# Patient Record
Sex: Male | Born: 1950
Health system: Southern US, Community
[De-identification: ages and names within clinical notes are randomized; demographics above are authoritative.]

## PROBLEM LIST (undated history)

## (undated) DIAGNOSIS — R9430 Abnormal result of cardiovascular function study, unspecified: Secondary | ICD-10-CM

## (undated) DIAGNOSIS — M1990 Unspecified osteoarthritis, unspecified site: Secondary | ICD-10-CM

## (undated) DIAGNOSIS — C4491 Basal cell carcinoma of skin, unspecified: Secondary | ICD-10-CM

## (undated) DIAGNOSIS — I219 Acute myocardial infarction, unspecified: Secondary | ICD-10-CM

## (undated) DIAGNOSIS — K219 Gastro-esophageal reflux disease without esophagitis: Secondary | ICD-10-CM

## (undated) DIAGNOSIS — I2510 Atherosclerotic heart disease of native coronary artery without angina pectoris: Secondary | ICD-10-CM

## (undated) DIAGNOSIS — Z9049 Acquired absence of other specified parts of digestive tract: Secondary | ICD-10-CM

## (undated) DIAGNOSIS — Z8709 Personal history of other diseases of the respiratory system: Secondary | ICD-10-CM

## (undated) DIAGNOSIS — E785 Hyperlipidemia, unspecified: Secondary | ICD-10-CM

## (undated) DIAGNOSIS — R0902 Hypoxemia: Secondary | ICD-10-CM

## (undated) DIAGNOSIS — J189 Pneumonia, unspecified organism: Secondary | ICD-10-CM

## (undated) DIAGNOSIS — N189 Chronic kidney disease, unspecified: Secondary | ICD-10-CM

## (undated) DIAGNOSIS — I7090 Unspecified atherosclerosis: Secondary | ICD-10-CM

## (undated) DIAGNOSIS — IMO0002 Reserved for concepts with insufficient information to code with codable children: Secondary | ICD-10-CM

## (undated) HISTORY — PX: CARDIAC CATHETERIZATION: SHX172

## (undated) HISTORY — PX: CHEST TUBE INSERTION: SHX231

## (undated) HISTORY — DX: Hyperlipidemia, unspecified: E78.5

## (undated) HISTORY — DX: Hypoxemia: R09.02

## (undated) HISTORY — PX: JOINT REPLACEMENT: SHX530

## (undated) HISTORY — DX: Atherosclerotic heart disease of native coronary artery without angina pectoris: I25.10

## (undated) HISTORY — PX: LEG SURGERY: SHX1003

## (undated) HISTORY — DX: Abnormal result of cardiovascular function study, unspecified: R94.30

## (undated) HISTORY — DX: Gastro-esophageal reflux disease without esophagitis: K21.9

## (undated) HISTORY — DX: Reserved for concepts with insufficient information to code with codable children: IMO0002

## (undated) HISTORY — DX: Acute myocardial infarction, unspecified: I21.9

## (undated) HISTORY — DX: Basal cell carcinoma of skin, unspecified: C44.91

## (undated) HISTORY — DX: Acquired absence of other specified parts of digestive tract: Z90.49

## (undated) HISTORY — DX: Chronic kidney disease, unspecified: N18.9

---

## 1976-01-15 HISTORY — PX: PLEURAL SCARIFICATION: SHX748

## 1979-01-15 HISTORY — PX: COLECTOMY: SHX59

## 1993-01-14 DIAGNOSIS — I219 Acute myocardial infarction, unspecified: Secondary | ICD-10-CM

## 1993-01-14 HISTORY — DX: Acute myocardial infarction, unspecified: I21.9

## 1999-06-11 ENCOUNTER — Encounter: Payer: Self-pay | Admitting: Cardiology

## 1999-06-11 ENCOUNTER — Inpatient Hospital Stay (HOSPITAL_COMMUNITY): Admission: AD | Admit: 1999-06-11 | Discharge: 1999-06-14 | Payer: Self-pay | Admitting: Cardiology

## 1999-06-13 ENCOUNTER — Encounter: Payer: Self-pay | Admitting: Cardiology

## 2002-08-02 ENCOUNTER — Emergency Department (HOSPITAL_COMMUNITY): Admission: EM | Admit: 2002-08-02 | Discharge: 2002-08-02 | Payer: Self-pay | Admitting: Emergency Medicine

## 2003-11-27 ENCOUNTER — Emergency Department (HOSPITAL_COMMUNITY): Admission: EM | Admit: 2003-11-27 | Discharge: 2003-11-28 | Payer: Self-pay | Admitting: Emergency Medicine

## 2004-03-13 ENCOUNTER — Ambulatory Visit: Payer: Self-pay | Admitting: Family Medicine

## 2004-06-12 ENCOUNTER — Encounter: Admission: RE | Admit: 2004-06-12 | Discharge: 2004-06-12 | Payer: Self-pay | Admitting: Occupational Medicine

## 2004-09-21 ENCOUNTER — Ambulatory Visit: Payer: Self-pay | Admitting: Family Medicine

## 2005-07-19 ENCOUNTER — Ambulatory Visit: Payer: Self-pay | Admitting: Family Medicine

## 2005-07-23 ENCOUNTER — Ambulatory Visit: Payer: Self-pay | Admitting: Family Medicine

## 2005-12-20 ENCOUNTER — Ambulatory Visit: Payer: Self-pay | Admitting: Family Medicine

## 2006-06-26 ENCOUNTER — Ambulatory Visit: Payer: Self-pay | Admitting: Family Medicine

## 2010-01-14 DIAGNOSIS — J189 Pneumonia, unspecified organism: Secondary | ICD-10-CM

## 2010-01-14 HISTORY — DX: Pneumonia, unspecified organism: J18.9

## 2010-06-01 NOTE — Procedures (Signed)
[place]. [facility]  Patient:    Tony Patrick, Tony Patrick                       MRN: [id number] Proc. Date: 06/13/99 Adm. Date:  [id number] Disc. Date: [id number] Attending:  Mirian Mo CC:         Jesse Sans. Wall, M.D.                           Procedure Report  PROCEDURE:  Endoscopy.  ENDOSCOPIST:  Barbette Hair. Arlyce Dice, M.D.  INDICATIONS:  The patient was admitted with chest pain.  He has been taking ibuprofen as an outpatient.  Cardiac cath was negative.  He does have a history of coronary artery disease.  This test is performed for further evaluation.  INFORMED CONSENT:  The patient provided consent after risks, benefits, and alternatives were explained.  MEDICATIONS:  Robinul 0.2, Versed 8, fentanyl 50 mcg IV, and Cetacaine spray.  DESCRIPTION OF PROCEDURE:  The patient was placed in the left lateral decubitus position, administered continuous low flow oxygen, and was placed on pulse oximetry.  The Olympus video gastroscope was inserted under direct vision to the oropharynx and esophagus.  FINDINGS: 1. At the GE junction, there was moderately severe inflamed mucosa    characterized by hemorrhagic mucosa and slight friability.  These changes    extended 4-5 cm proximally.  It also extended into the very proximal    gastric cardia. 2. There was moderate erythema in the duodenal bulb. 3. Normal stomach and second and third portions of the duodenum.  IMPRESSION: 1. Erosive esophagitis. 2. Duodenitis.  RECOMMENDATIONS: 1. Continue Protonix 40 mg a day. 2. Hold NSAIDs. DD:  06/13/99 TD:  06/18/99 Job: [id number]

## 2010-06-01 NOTE — Discharge Summary (Signed)
[place]. [facility]  Patient:    Tony Patrick, Tony Patrick                       MRN: [id number] Adm. Date:  [id number] Disc. Date: 06/14/99 Attending:  Mirian Mo Dictator:   Tereso Newcomer, P.A.-C. CC:         Barbette Hair. Arlyce Dice, M.D. [facility]             Madolyn Frieze. Jens Som, M.D. [facility]             Roxanne Mins, P.A.-C. Jonita [place], [place]             Colon Flattery, M.D. - Phone [phone]                           Discharge Summary  DATE OF BIRTH:  [birth year]  DISCHARGE DIAGNOSES: 1. Severe esophagitis by endoscopy. 2. History of coronary artery disease.  The reason for admission was chest pain.    A cardiac catheterization this admission with a 30% LAD stenosis beyond the    origin of the large diagonal branch, circumflex normal, RCA normal, left main    normal, with anterolateral and apical akinesis, ejection fraction calculated at    25%, but visually more in the range of 35%. 3. Hypercholesterolemia. 4. Status post left colectomy, secondary to benign tumor. 5. Status post anterior wall myocardial infarction in 1995.  A cardiac    catheterization done at [facility] with a totally-occluded left    anterior descending coronary artery and an ejection fraction of 33%.  HISTORY OF PRESENT ILLNESS:  This 60 year old white male with CAD, status post large anterior wall myocardial infarction in 1995, and hypercholesterolemia, was transferred from [facility], secondary to chest pain.  He was awakened on the morning before admission with shortness of breath, right-sided chest pain and sweating.  His chest pain lasted all day, and was associated with emesis, shortness of breath, and diaphoresis.  His right-sided chest pain was exacerbated with movement, and radiated to his right arm.  When he presented to [facility], two nitroglycerin relieved the pain.  Upon initial evaluation the patient was asymptomatic.  PHYSICAL EXAMINATION:  GENERAL:  A  male in no acute distress.  NECK:  Without jugular venous distention.  LUNGS:  Clear to auscultation.  HEART:  A regular rate and rhythm.  Normal S1, S2.  No murmurs, gallops, or rubs.  EXTREMITIES:  Without edema, with 1+ pulses.  LABORATORY DATA:  Hemoglobin 15.4, platelet count 236,000, WBC 9800.  Sodium 139, potassium 3.6, chloride 113, CO2 of 22, BUN 16, creatinine 1.1, glucose 93. Total protein 6.8, albumin 3.6, alkaline phosphatase 66, SGOT 23, SGPT 23, total bilirubin 0.4.  INR 1, PTT 25.4, PT 11.7, CPK 106, MB 1.5, troponin I 0.04.  Electrocardiogram showed a normal sinus rhythm, unchanged from previous tracings.  HOSPITAL COURSE:  The patient was received from [facility] and placed on the TCU.  His heparin drip was continued.  His home medication regimen was resumed. He was placed on a proton pump inhibitor for a history of GERD.  Also enzymes were  checked at [facility].  The first set done showed a total CPK of 94, MB of 1.0, troponin I less than 0.03.  The second set showed a CPK of 78, MB 0.6, troponin I less than 0.03.  On the morning of May __, 2001,  it was noted that the patients electrocardiogram had increased T-wave inversions anteriorly.  Given this change, it was felt that the patient should undergo a cardiac catheterization to redefine his coronary anatomy.  The patient remained stable without any further chest pain.  The patient went for a cardiac catheterization on Jun 12, 1999.  The results are noted above. He had no immediate complications.  Given his long history of GERD and unobstructive disease by a coronary angiography, it was felt that a GI consultation was warranted.  Gastroenterology saw the patient on Jun 12, 1999.  They continued the patient on a proton pump inhibitor and decided to check an abdominal ultrasound, as well as set him up for an esophagogastroduodenoscopy.  The patient continued to do well and  had no further chest pain.  His ultrasound was reported as negative by gastroenterology on Jun 13, 1999.  His endoscopy showed moderately-severe esophagitis and they felt this could be his likely source of chest pain.  DISPOSITION:  On the morning of Jun 14, 1999, it was felt that the patient was stable enough for discharge to home.  DISCHARGE MEDICATIONS: 1. Prevacid 30 mg p.o. q.a.m. before breakfast. 2. Zocor 20 mg p.o. q.h.s. 3. Enteric-coated aspirin 325 mg p.o. q.d. 4. Atenolol 50 mg 1/2 p.o. q.d.  This was decreased secondary to bradycardia. 5. Vasotec 20 mg p.o. b.i.d. 6. Nitroglycerin 0.4 mg sublingual p.r.n. chest pain.  ACTIVITIES:  The patient should refrain from any driving or heavy lifting for the next three days.  He was given a note to return to work on Monday, June 18, 1999.  DIET:  Low-fat, low-cholesterol, low-sodium diet.  WOUND CARE:  The patient should watch his groin for any increased swelling, bleeding, or bruising and to call the office with concerns.  FOLLOWUP:  The patient will follow up with Dr. Madolyn Frieze. Cren[place] on Thursday, July 26, 1999, at 10 a.m.  Gastroenterology recommended that the patient should follow up with Dr. Barbette Hair. Arlyce Dice in one year. DD:  06/14/99 TD:  06/14/99 Job: [id number]

## 2010-06-01 NOTE — Cardiovascular Report (Signed)
[place]. [facility]  Patient:    Tony Patrick, Tony Patrick                       MRN: [id number] Proc. Date: 06/12/99 Adm. Date:  [id number] Attending:  Mirian Mo CC:         Arturo Morton. Riley Kill, M.D. [facility]             [facility]             Colon Flattery, D.O.             Roxanne Mins, P.A.C., [facility]                        Cardiac Catheterization  HISTORY:   Tony Patrick is a very pleasant 60 year old male who has had previous anterior wall infarction.  This was treated at [facility].  The patient now presents with recurrent chest pain.  INDICATIONS:  Recurrent chest pain with known coronary artery disease.  PROCEDURES: 1. Left heart catheterization. 2. Selective coronary arteriography. 3. Selective left ventriculography.  COMPLICATIONS:  None.  DESCRIPTION OF PROCEDURE:  The procedure was performed from the right femoral artery using 6 French catheters.  He tolerated the procedure without complication.  He was taken to the holding area in satisfactory clinical condition.  The patient has a history of CONTRAST allergy and was pretreated with steroids, Benadryl and histamine II antagonist.  HEMODYNAMICS:  The central aortic pressure was 117/79. LV pressure 117/15. No gradient on pullback across the aortic valve.  ANGIOGRAPHIC DATA:  The left main coronary artery is free of significant disease.  The left anterior descending artery courses to the apex.  Beyond the origin of the large diagonal branch there is narrowing that measures about 30% in luminal diameter.  This does not appear to be high-grade.  The LAD courses to the apex and wraps the apical tip.  There was a large diagonal branch with minimal ostial irregularity but no high-grade lesions.  There are smaller distal diagonal branches.  The circumflex coronary provides a small first marginal branch that is insignificant.  The circumflex consists predominately of a very  large marginal branch that courses out over the anterolateral wall and bifurcates distally and is free of critical disease.  The AV circumflex is small and provides an atrial circumflex branch.  The right coronary artery is a large dominant vessel.  The right coronary artery provides a posterior descending branch which bifurcates and five posterolateral branches.  The distal right coronary demonstrates no high-grade lesions.  LEFT VENTRICULOGRAPHY:  Ventriculography in the RAO projection reveals anterolateral and apical akinesis.  The inferobasal and superior basal segments appear to move.  Ejection fraction was calculated at 25% but visually was more in the range of 35%.  No significant mitral regurgitation was noted.  CONCLUSIONS: 1. Moderately severe reduction in global left ventricular function. 2. No evidence of re-stenosis in the left anterior descending artery. 3. Continued patency of the circumflex and right coronary arteries.  DISPOSITION:  The patient is already on an ACE inhibitor, beta blockers and cholesterol lowering agent.  He is also on aspirin.  Protonix has been added to his regimen because of a history of GERD.  A GI consult will be obtained. I have called Dr. Dewaine Conger and also discussed the case with Arnette Felts. DD:  06/12/99 TD:  06/13/99 Job: [id number]

## 2010-07-02 DIAGNOSIS — I059 Rheumatic mitral valve disease, unspecified: Secondary | ICD-10-CM

## 2010-07-12 ENCOUNTER — Encounter: Payer: Self-pay | Admitting: *Deleted

## 2010-07-23 ENCOUNTER — Encounter: Payer: Self-pay | Admitting: Cardiology

## 2010-07-23 DIAGNOSIS — E785 Hyperlipidemia, unspecified: Secondary | ICD-10-CM | POA: Insufficient documentation

## 2010-07-23 DIAGNOSIS — K219 Gastro-esophageal reflux disease without esophagitis: Secondary | ICD-10-CM | POA: Insufficient documentation

## 2010-07-23 DIAGNOSIS — R0902 Hypoxemia: Secondary | ICD-10-CM | POA: Insufficient documentation

## 2010-07-23 DIAGNOSIS — I2510 Atherosclerotic heart disease of native coronary artery without angina pectoris: Secondary | ICD-10-CM | POA: Insufficient documentation

## 2010-07-23 DIAGNOSIS — R9430 Abnormal result of cardiovascular function study, unspecified: Secondary | ICD-10-CM | POA: Insufficient documentation

## 2010-07-24 ENCOUNTER — Ambulatory Visit (INDEPENDENT_AMBULATORY_CARE_PROVIDER_SITE_OTHER): Payer: BC Managed Care – PPO | Admitting: Cardiology

## 2010-07-24 ENCOUNTER — Encounter: Payer: Self-pay | Admitting: Cardiology

## 2010-07-24 DIAGNOSIS — E785 Hyperlipidemia, unspecified: Secondary | ICD-10-CM

## 2010-07-24 DIAGNOSIS — I2510 Atherosclerotic heart disease of native coronary artery without angina pectoris: Secondary | ICD-10-CM

## 2010-07-24 DIAGNOSIS — R9430 Abnormal result of cardiovascular function study, unspecified: Secondary | ICD-10-CM

## 2010-07-24 DIAGNOSIS — R0989 Other specified symptoms and signs involving the circulatory and respiratory systems: Secondary | ICD-10-CM

## 2010-07-24 MED ORDER — CARVEDILOL 12.5 MG PO TABS: 12.5000 mg | ORAL_TABLET | Freq: Two times a day (BID) | ORAL | Status: DC

## 2010-07-24 NOTE — Assessment & Plan Note (Signed)
Patient had an MI in 1995.  There is question that he may have had repeat catheterization in 2001. I will have to reconfirm this.  Regardless, he has had no significant evaluation since 003.  He is not having any significant symptoms.

## 2010-07-24 NOTE — Progress Notes (Signed)
HPI The patient is seen today to establish cardiology care.  He had been seen in this office last in 2003.  Unfortunately the chart from that time is not available.  I have reviewed other medical records back to 2001.  The patient had an MRI and was first assessed at [facility] in 1995.  My records suggest that he had a followup catheterization at [facility] in 2001.  The patient and his wife insists that he did not have this done then.  I will have to try to re\re reviewed the records.  The patient has not had any significant cardiac evaluation since 2003.  He does have known left ventricular dysfunction.  He was recently hospitalized with a pneumonia.  2-D echo at that time was read as showing an ejection fraction of 30%.  He is now here to reestablish cardiology care.  He is very active.  He's not having chest pain or shortness of breath.  He's had no syncope or presyncope. Allergies  Allergen Reactions  . Ivp Dye (Iodinated Diagnostic Agents)     Swelling and arrest  . Morphine And Related Nausea And Vomiting    Current Outpatient Prescriptions  Medication Sig Dispense Refill  . aspirin 81 MG tablet Take 81 mg by mouth daily.        . enalapril (VASOTEC) 20 MG tablet Take 20 mg by mouth 2 (two) times daily.        Marland Kitchen loratadine (CLARITIN) 10 MG tablet Take 10 mg by mouth daily.        . meloxicam (MOBIC) 7.5 MG tablet Take 7.5 mg by mouth 2 (two) times daily.        Marland Kitchen omeprazole (PRILOSEC) 20 MG capsule Take 1 capsule by mouth Daily.      . simvastatin (ZOCOR) 40 MG tablet Take 20 mg by mouth at bedtime.       Marland Kitchen DISCONTD: atenolol (TENORMIN) 50 MG tablet Take 50 mg by mouth daily.        . carvedilol (COREG) 12.5 MG tablet Take 1 tablet (12.5 mg total) by mouth 2 (two) times daily.  60 tablet  6    History   Social History  . Marital Status: Married    Spouse Name: N/A    Number of Children: N/A  . Years of Education: N/A   Occupational History  . Not on file.   Social  History Main Topics  . Smoking status: Former Smoker -- 2.0 packs/day for 32 years    Types: Cigarettes    Quit date: 04/14/1992  . Smokeless tobacco: Never Used  . Alcohol Use: No  . Drug Use: No  . Sexually Active: Not on file   Other Topics Concern  . Not on file   Social History Narrative   Lives with wife    Family History  Problem Relation Age of Onset  . Diabetes Mother   . Hypertension Mother     Past Medical History  Diagnosis Date  . CAD (coronary artery disease)     Anterior MI, [facility], 1995, no PCI, total LAD  / catheterization 2001, 30% LAD beyond the origin of a large diagonal, circumflex normal, RCA normal, anterolateral and apical  akinesis, ejection fraction 25-35% range  . Kidney stones 1990's  . GERD (gastroesophageal reflux disease)   . S/P colectomy     Benign tumor  . Dyslipidemia   . Ejection fraction < 50%     EF 25-35%, catheterization 2001 / EF 20-25%,  global hypokinesis, echo in June, 2012  . Hypoxia     Pneumonia, hospitalization, June, 2012    Past Surgical History  Procedure Date  . Colectomy 1981    benign mass  . Leg surgery     benign knot  . Pleural scarification 1978    ROS  Patient denies fever, chills, headache, sweats, rash, change in vision, change in hearing, chest pain, cough, nausea vomiting, urinary symptoms.  All other systems are reviewed and are negative.  PHYSICAL EXAM Patient is here with his wife today.  He is oriented to person time and place.  Affect is normal.  He is overweight.  Head is atraumatic.  Lungs are clear.  Respiratory effort is nonlabored.  There no carotid bruits.  There is no jugular venous distention.  Neck exam reveals S1-S2.  No clicks or significant murmurs.  The abdomen is soft.  There is no peripheral edema.  There are no musculoskeletal deformities.  There are no skin rashes. Filed Vitals:   07/24/10 1109  BP: 131/91  Pulse: 61  Height: 5\' 9"  (1.753 m)  Weight: 233 lb (105.688 kg)    EKG  EKG is not done today. ASSESSMENT & PLAN

## 2010-07-24 NOTE — Assessment & Plan Note (Signed)
Most recently the patient's echo suggested an ejection fraction of 20-25%.  I will rereview this echo.  I am changing his atenolol to carvedilol and all seem back for followup to titrate his medicines.  He is on an ACE inhibitor.  We will consider spironolactone a later date.  At some point we will have to discuss whether ICD is indicated.  From today's visit it was not appropriate to discuss his Foley today.

## 2010-07-24 NOTE — Assessment & Plan Note (Signed)
Lipids are being treated. No change in therapy. 

## 2010-07-24 NOTE — Patient Instructions (Addendum)
Follow up as scheduled. Stop Atenolol. Start Coreg (carvedilol) 12.5 mg two times a day.

## 2010-10-02 ENCOUNTER — Ambulatory Visit (INDEPENDENT_AMBULATORY_CARE_PROVIDER_SITE_OTHER): Payer: BC Managed Care – PPO | Admitting: Cardiology

## 2010-10-02 ENCOUNTER — Encounter: Payer: Self-pay | Admitting: Cardiology

## 2010-10-02 DIAGNOSIS — I2510 Atherosclerotic heart disease of native coronary artery without angina pectoris: Secondary | ICD-10-CM

## 2010-10-02 DIAGNOSIS — K219 Gastro-esophageal reflux disease without esophagitis: Secondary | ICD-10-CM

## 2010-10-02 DIAGNOSIS — R9430 Abnormal result of cardiovascular function study, unspecified: Secondary | ICD-10-CM

## 2010-10-02 DIAGNOSIS — R0989 Other specified symptoms and signs involving the circulatory and respiratory systems: Secondary | ICD-10-CM

## 2010-10-02 MED ORDER — CARVEDILOL 25 MG PO TABS: 25.0000 mg | ORAL_TABLET | Freq: Two times a day (BID) | ORAL | Status: DC

## 2010-10-02 NOTE — Assessment & Plan Note (Signed)
The patient is stable from his cardiomyopathy.  At his last visit I had switched him from a different beta blocker to carvedilol.  Heart rate is 71.  Next we increase the carvedilol.  I will see him back in a few weeks and make decision about whether I can increase the dose of his ACE inhibitor.  After that consideration be given to using spironolactone.  After that to be a followup echo and we will begin to think about whether an ICD should be considered.

## 2010-10-02 NOTE — Assessment & Plan Note (Signed)
Coronary disease is stable.  No further workup. 

## 2010-10-02 NOTE — Assessment & Plan Note (Signed)
He's not been having any recent GERD symptoms.

## 2010-10-02 NOTE — Progress Notes (Signed)
HPI Patient is seen today for followup of coronary disease and cardiomyopathy.  I saw him last July 24, 2010.  I saw him at that visit it was his first visit after hospitalization.  He was noted to have new left ventricular dysfunction at that time.  There was no myocardial infarction.  At his last visit I began titrating his medications.  He is now here for further titration.  He feels well and is not having chest pain.  He's not had any significant shortness of breath.  There's been no syncope or presyncope. Allergies  Allergen Reactions  . Ivp Dye (Iodinated Diagnostic Agents)     Swelling and arrest  . Morphine And Related Nausea And Vomiting    Current Outpatient Prescriptions  Medication Sig Dispense Refill  . aspirin 81 MG tablet Take 81 mg by mouth daily.        . carvedilol (COREG) 12.5 MG tablet Take 1 tablet (12.5 mg total) by mouth 2 (two) times daily.  60 tablet  6  . enalapril (VASOTEC) 20 MG tablet Take 20 mg by mouth 2 (two) times daily.        Marland Kitchen loratadine (CLARITIN) 10 MG tablet Take 10 mg by mouth daily.        . meloxicam (MOBIC) 7.5 MG tablet Take 7.5 mg by mouth 2 (two) times daily.        Marland Kitchen omeprazole (PRILOSEC) 20 MG capsule Take 1 capsule by mouth Daily.      . simvastatin (ZOCOR) 40 MG tablet Take 20 mg by mouth at bedtime.         History   Social History  . Marital Status: Married    Spouse Name: N/A    Number of Children: N/A  . Years of Education: N/A   Occupational History  . Not on file.   Social History Main Topics  . Smoking status: Former Smoker -- 2.0 packs/day for 32 years    Types: Cigarettes    Quit date: 04/14/1992  . Smokeless tobacco: Never Used  . Alcohol Use: No  . Drug Use: No  . Sexually Active: Not on file   Other Topics Concern  . Not on file   Social History Narrative   Lives with wife    Family History  Problem Relation Age of Onset  . Diabetes Mother   . Hypertension Mother     Past Medical History  Diagnosis  Date  . CAD (coronary artery disease)     Anterior MI, [facility], 1995, no PCI, total LAD  / catheterization 2001, 30% LAD beyond the origin of a large diagonal, circumflex normal, RCA normal, anterolateral and apical  akinesis, ejection fraction 25-35% range  . Kidney stones 1990's  . GERD (gastroesophageal reflux disease)   . S/P colectomy     Benign tumor  . Dyslipidemia   . Ejection fraction < 50%     EF 25-35%, catheterization 2001 / EF 20-25%, global hypokinesis, echo in June, 2012  . Hypoxia     Pneumonia, hospitalization, June, 2012    Past Surgical History  Procedure Date  . Colectomy 1981    benign mass  . Leg surgery     benign knot  . Pleural scarification 1978    ROS  Patient denies fever, chills, headache, sweats, rash, change in vision, change in hearing, chest pain, cough, nausea vomiting, urinary symptoms.  All other systems are reviewed and are negative. PHYSICAL EXAM Patient is stable today.  He is oriented  to person time and place.  Affect is normal.  Head is atraumatic.  There is no jugular venous distention.  Lungs are clear.  Respiratory effort is nonlabored.  Cardiac exam reveals S1-S2.  No clicks or significant murmurs.  The abdomen is soft.  There is no peripheral edema. There were no vitals filed for this visit.  EKG EKG is not done today.  ASSESSMENT & PLAN

## 2010-10-02 NOTE — Patient Instructions (Signed)
Follow up as scheduled. Increase Coreg (Carvedilol) to 25 mg two times a day. You may take 2 of your 12.5 mg tablets two times a day until gone and then get new prescription filled for 25 mg tablets.

## 2010-10-29 ENCOUNTER — Encounter: Payer: Self-pay | Admitting: Cardiology

## 2010-10-29 ENCOUNTER — Ambulatory Visit (INDEPENDENT_AMBULATORY_CARE_PROVIDER_SITE_OTHER): Payer: BC Managed Care – PPO | Admitting: Cardiology

## 2010-10-29 DIAGNOSIS — I2510 Atherosclerotic heart disease of native coronary artery without angina pectoris: Secondary | ICD-10-CM

## 2010-10-29 DIAGNOSIS — R0989 Other specified symptoms and signs involving the circulatory and respiratory systems: Secondary | ICD-10-CM

## 2010-10-29 DIAGNOSIS — R9430 Abnormal result of cardiovascular function study, unspecified: Secondary | ICD-10-CM

## 2010-10-29 NOTE — Patient Instructions (Signed)
Follow up as scheduled. Your physician recommends that you continue on your current medications as directed. Please refer to the Current Medication list given to you today. Your physician has requested that you have an echocardiogram. Echocardiography is a painless test that uses sound waves to create images of your heart. It provides your doctor with information about the size and shape of your heart and how well your heart's chambers and valves are working. This procedure takes approximately one hour. There are no restrictions for this procedure.

## 2010-10-29 NOTE — Assessment & Plan Note (Signed)
Patient has significant cardiomyopathy.  His meds have now been adjusted to the place where we can receive a followup 2-D echo.  Today I have had a long and careful discussion with the patient and his wife.  It was the first time that I mentioned to him the concept of placing an ICD for LV dysfunction.  I explained to him that we will now schedule a followup 2-D echo.  I will then see him back for followup for further discussion.  With our first discussion today about possible ICD he listened but was not willing to give any type of definite answer.  When I see him back we'll have further discussions.  I am not titrating his meds any further today.  I will want to add spironolactone.  I will again review we should increase the dose of his ACE inhibitor or changing to another Ace.

## 2010-10-29 NOTE — Progress Notes (Signed)
HPI Patient is seen today to followup coronary disease and cardiomyopathy.  He had been hospitalized with pneumonia in June, 2012.  Two-dimensional echo at that time revealed ejection fraction of 30%.  He therefore reestablish with my care.  We know that he has coronary disease.  His last catheterization was 2001.  He had anterolateral and apical akinesis.  Is not had an exercise test since that time.  Most recently I have been titrating his meds for his cardiomyopathy.  He is tolerating the higher dose of carvedilol.  He is not having any chest pain or shortness of breath.   Allergies  Allergen Reactions  . Ivp Dye (Iodinated Diagnostic Agents)     Swelling and arrest  . Morphine And Related Nausea And Vomiting    Current Outpatient Prescriptions  Medication Sig Dispense Refill  . aspirin 81 MG tablet Take 81 mg by mouth daily.        . carvedilol (COREG) 25 MG tablet Take 1 tablet (25 mg total) by mouth 2 (two) times daily.  60 tablet  6  . enalapril (VASOTEC) 20 MG tablet Take 20 mg by mouth 2 (two) times daily.        Marland Kitchen loratadine (CLARITIN) 10 MG tablet Take 10 mg by mouth daily.        . meloxicam (MOBIC) 7.5 MG tablet Take 7.5 mg by mouth daily.       Marland Kitchen omeprazole (PRILOSEC) 20 MG capsule Take 1 capsule by mouth Daily.      . simvastatin (ZOCOR) 40 MG tablet Take 40 mg by mouth at bedtime.         History   Social History  . Marital Status: Married    Spouse Name: N/A    Number of Children: N/A  . Years of Education: N/A   Occupational History  . Not on file.   Social History Main Topics  . Smoking status: Former Smoker -- 2.0 packs/day for 32 years    Types: Cigarettes    Quit date: 04/14/1992  . Smokeless tobacco: Never Used  . Alcohol Use: No  . Drug Use: No  . Sexually Active: Not on file   Other Topics Concern  . Not on file   Social History Narrative   Lives with wife    Family History  Problem Relation Age of Onset  . Diabetes Mother   .  Hypertension Mother     Past Medical History  Diagnosis Date  . CAD (coronary artery disease)     Anterior MI, [facility], 1995, no PCI, total LAD  / catheterization 2001, 30% LAD beyond the origin of a large diagonal, circumflex normal, RCA normal, anterolateral and apical  akinesis, ejection fraction 25-35% range  . Kidney stones 1990's  . GERD (gastroesophageal reflux disease)   . S/P colectomy     Benign tumor  . Dyslipidemia   . Ejection fraction < 50%     EF 25-35%, catheterization 2001 / EF 20-25%, global hypokinesis, echo in June, 2012  . Hypoxia     Pneumonia, hospitalization, June, 2012    Past Surgical History  Procedure Date  . Colectomy 1981    benign mass  . Leg surgery     benign knot  . Pleural scarification 1978    ROS  Patient denies fever, chills, headache, sweats, rash, change in vision, change in hearing, chest pain, cough, nausea vomiting, urinary symptoms.  All other systems are reviewed and are negative.  PHYSICAL EXAM Patient is oriented  to person time and place.Patient is here with his wife today.  Affect is normal.  Head is atraumatic.  There is no jugulovenous distention.  Lungs are clear.  Respiratory effort is not labored.  Cardiac exam reveals S1-S2.  No clicks or significant murmurs.  The abdomen is soft there is no peripheral edema.  There are no musculoskeletal deformities.  No skin rashes. Filed Vitals:   10/29/10 0950  BP: 126/82  Pulse: 65  Height: 5\' 11"  (1.803 m)  Weight: 241 lb (109.317 kg)    EKG is not done today. ASSESSMENT & PLAN

## 2010-10-29 NOTE — Assessment & Plan Note (Signed)
Coronary disease is stable.  No change in therapy at this time.  First he will have an echo.  We will then decide about whether he should be referred for ICD.  Since he has not had any type of exercise test this of course would be indicated unless we decided to proceed with repeat catheterization.  It is important to note that he is not having significant symptoms at this time.

## 2010-11-01 ENCOUNTER — Other Ambulatory Visit (INDEPENDENT_AMBULATORY_CARE_PROVIDER_SITE_OTHER): Payer: BC Managed Care – PPO | Admitting: *Deleted

## 2010-11-01 DIAGNOSIS — R9430 Abnormal result of cardiovascular function study, unspecified: Secondary | ICD-10-CM

## 2010-11-01 DIAGNOSIS — I2510 Atherosclerotic heart disease of native coronary artery without angina pectoris: Secondary | ICD-10-CM

## 2010-11-05 ENCOUNTER — Encounter: Payer: Self-pay | Admitting: *Deleted

## 2010-11-06 ENCOUNTER — Telehealth: Payer: Self-pay | Admitting: *Deleted

## 2010-11-06 NOTE — Telephone Encounter (Signed)
Message copied by Arlyss Gandy on Tue Nov 06, 2010  3:59 PM ------      Message from: [place], [place] D      Created: Tue Nov 06, 2010 11:51 AM       Please let him know that the followup echo shows that he is heart function is still decreased.  I will discuss this further with him at the time of his followup visit

## 2010-11-06 NOTE — Telephone Encounter (Signed)
Pt notified of results and verbalized understanding  

## 2010-12-10 ENCOUNTER — Ambulatory Visit (INDEPENDENT_AMBULATORY_CARE_PROVIDER_SITE_OTHER): Payer: BC Managed Care – PPO | Admitting: Cardiology

## 2010-12-10 ENCOUNTER — Encounter: Payer: Self-pay | Admitting: Cardiology

## 2010-12-10 DIAGNOSIS — R9430 Abnormal result of cardiovascular function study, unspecified: Secondary | ICD-10-CM

## 2010-12-10 DIAGNOSIS — I2510 Atherosclerotic heart disease of native coronary artery without angina pectoris: Secondary | ICD-10-CM

## 2010-12-10 DIAGNOSIS — R0989 Other specified symptoms and signs involving the circulatory and respiratory systems: Secondary | ICD-10-CM

## 2010-12-10 NOTE — Assessment & Plan Note (Signed)
Coronary disease is stable.  His ejection fraction remains low.  I have recommended a nuclear stress study.  He feels well and does not want to have this at this time.

## 2010-12-10 NOTE — Patient Instructions (Signed)
Continue all current medications. Your physician wants you to follow up in: 6 months.  You will receive a reminder letter in the mail one-two months in advance.  If you don't receive a letter, please call our office to schedule the follow up appointment   

## 2010-12-10 NOTE — Progress Notes (Signed)
HPI Patient is here to followup coronary artery disease.  I saw him last October, 2012.  The patient has known coronary disease with Tony catheterization done last in 2001.  Tony Patrick was hospitalized with pneumonia in June, 2012.  2-D echo revealed an ejection fraction of 30%.  Tony Patrick had an anterior MI in the past.  As an outpatient I first titrate Tony meds.  Tony Patrick is on a good dose of carvedilol and ACE inhibitor.  Tony Patrick is not on spironolactone.  Currently Tony Patrick is [place] Heart Association class I.  Tony Patrick is hesitant to take any other medications.  We repeat a 2-D echo since Tony last visit to see if Tony LV function has changed on medication.  Study was done November 01, 2010.  Ejection fraction was 25%.  I have reviewed Tony data with the patient and Tony Patrick very carefully.  I have explained to him that I feel that we should do some type of assessment to rule out ischemia at this point even though Tony Patrick has no symptoms.  I also mentioned that consideration of an ICD would still be appropriate.  Tony Patrick is feeling well and does not want any of these studies Allergies  Allergen Reactions  . Ivp Dye (Iodinated Diagnostic Agents)     Swelling and arrest  . Morphine And Related Nausea And Vomiting    Current Outpatient Prescriptions  Medication Sig Dispense Refill  . aspirin 81 MG tablet Take 81 mg by mouth daily.        . carvedilol (COREG) 25 MG tablet Take 1 tablet (25 mg total) by mouth 2 (two) times daily.  60 tablet  6  . enalapril (VASOTEC) 20 MG tablet Take 20 mg by mouth 2 (two) times daily.        Marland Kitchen loratadine (CLARITIN) 10 MG tablet Take 10 mg by mouth daily.        . meloxicam (MOBIC) 7.5 MG tablet Take 7.5 mg by mouth daily.       Marland Kitchen omeprazole (PRILOSEC) 20 MG capsule Take 1 capsule by mouth Daily.      . simvastatin (ZOCOR) 40 MG tablet Take 40 mg by mouth at bedtime.         History   Social History  . Marital Status: Married    Spouse Name: N/A    Number of Children: N/A  . Years of Education: N/A    Occupational History  . Not on file.   Social History Main Topics  . Smoking status: Former Smoker -- 2.0 packs/day for 32 years    Types: Cigarettes    Quit date: 04/14/1992  . Smokeless tobacco: Never Used  . Alcohol Use: No  . Drug Use: No  . Sexually Active: Not on file   Other Topics Concern  . Not on file   Social History Narrative   Lives with Patrick    Family History  Problem Relation Age of Onset  . Diabetes Mother   . Hypertension Mother     Past Medical History  Diagnosis Date  . CAD (coronary artery disease)     Anterior MI, [facility], 1995, no PCI, total LAD  / catheterization 2001, 30% LAD beyond the origin of a large diagonal, circumflex normal, RCA normal, anterolateral and apical  akinesis, ejection fraction 25-35% range  . Kidney stones 1990's  . GERD (gastroesophageal reflux disease)   . S/P colectomy     Benign tumor  . Dyslipidemia   . Ejection fraction < 50%  EF 25-35%, catheterization 2001 / EF 20-25%, global hypokinesis, echo in June, 2012  . Hypoxia     Pneumonia, hospitalization, June, 2012    Past Surgical History  Procedure Date  . Colectomy 1981    benign mass  . Leg surgery     benign knot  . Pleural scarification 1978    ROS   Patient denies fever, chills, headache, sweats, rash, change in vision, change in hearing, chest pain, cough, nausea vomiting, urinary symptoms.  All other systems are reviewed and are negative.  PHYSICAL EXAM   Patient is here with Tony Patrick.  Tony Patrick is oriented to person time and place.  Affect is normal.  There is no guarding distention.  Lungs are clear.  Respiratory effort is nonlabored.  Cardiac exam reveals S1 and S2.  No clicks or significant murmurs.  The abdomen is soft but protuberant.  There is no peripheral edema.  Filed Vitals:   12/10/10 1049  BP: 128/84  Pulse: 71  Height: 5\' 11"  (1.803 m)  Weight: 245 lb (111.131 kg)    ASSESSMENT & PLAN

## 2010-12-10 NOTE — Assessment & Plan Note (Signed)
Patient is on appropriate doses of ACE inhibitor and beta blockade.  He is tolerating these.  He is class I.  He prefers not to have any other medications and on this basis I've chosen not yet add spironolactone.  I will reconsider this at his next visit.  He absolutely is not interested in considering an ICD at this point.  We'll see him back in 6 months.

## 2011-04-15 ENCOUNTER — Other Ambulatory Visit: Payer: Self-pay | Admitting: *Deleted

## 2011-04-15 MED ORDER — CARVEDILOL 25 MG PO TABS: 25.0000 mg | ORAL_TABLET | Freq: Two times a day (BID) | ORAL | Status: DC

## 2013-09-26 ENCOUNTER — Emergency Department (HOSPITAL_COMMUNITY): Payer: Medicare Other

## 2013-09-26 ENCOUNTER — Encounter (HOSPITAL_COMMUNITY): Payer: Self-pay | Admitting: Emergency Medicine

## 2013-09-26 ENCOUNTER — Emergency Department (HOSPITAL_COMMUNITY)
Admission: EM | Admit: 2013-09-26 | Discharge: 2013-09-26 | Disposition: A | Discharge status: Home / Self Care | Payer: Medicare Other | Attending: Emergency Medicine | Admitting: Emergency Medicine

## 2013-09-26 DIAGNOSIS — M545 Low back pain: Secondary | ICD-10-CM

## 2013-09-26 DIAGNOSIS — I2510 Atherosclerotic heart disease of native coronary artery without angina pectoris: Secondary | ICD-10-CM | POA: Diagnosis not present

## 2013-09-26 DIAGNOSIS — M79604 Pain in right leg: Secondary | ICD-10-CM

## 2013-09-26 DIAGNOSIS — Y929 Unspecified place or not applicable: Secondary | ICD-10-CM | POA: Insufficient documentation

## 2013-09-26 DIAGNOSIS — Z9889 Other specified postprocedural states: Secondary | ICD-10-CM | POA: Diagnosis not present

## 2013-09-26 DIAGNOSIS — IMO0002 Reserved for concepts with insufficient information to code with codable children: Secondary | ICD-10-CM | POA: Insufficient documentation

## 2013-09-26 DIAGNOSIS — Z87442 Personal history of urinary calculi: Secondary | ICD-10-CM | POA: Diagnosis not present

## 2013-09-26 DIAGNOSIS — Z87891 Personal history of nicotine dependence: Secondary | ICD-10-CM | POA: Diagnosis not present

## 2013-09-26 DIAGNOSIS — Z7982 Long term (current) use of aspirin: Secondary | ICD-10-CM | POA: Diagnosis not present

## 2013-09-26 DIAGNOSIS — Y9389 Activity, other specified: Secondary | ICD-10-CM | POA: Insufficient documentation

## 2013-09-26 DIAGNOSIS — Z791 Long term (current) use of non-steroidal anti-inflammatories (NSAID): Secondary | ICD-10-CM | POA: Diagnosis not present

## 2013-09-26 DIAGNOSIS — K219 Gastro-esophageal reflux disease without esophagitis: Secondary | ICD-10-CM | POA: Insufficient documentation

## 2013-09-26 DIAGNOSIS — E785 Hyperlipidemia, unspecified: Secondary | ICD-10-CM | POA: Diagnosis not present

## 2013-09-26 DIAGNOSIS — Z79899 Other long term (current) drug therapy: Secondary | ICD-10-CM | POA: Diagnosis not present

## 2013-09-26 DIAGNOSIS — X500XXA Overexertion from strenuous movement or load, initial encounter: Secondary | ICD-10-CM | POA: Diagnosis not present

## 2013-09-26 MED ORDER — METHOCARBAMOL 500 MG PO TABS: 500.0000 mg | ORAL_TABLET | Freq: Four times a day (QID) | ORAL | Status: DC | PRN

## 2013-09-26 MED ORDER — METHOCARBAMOL 500 MG PO TABS
1000.0000 mg | ORAL_TABLET | Freq: Once | ORAL | Status: AC
  Administered 2013-09-26: 1000 mg via ORAL
  Filled 2013-09-26: qty 2

## 2013-09-26 MED ORDER — OXYCODONE-ACETAMINOPHEN 5-325 MG PO TABS
1.0000 | ORAL_TABLET | Freq: Once | ORAL | Status: AC
  Administered 2013-09-26: 1 via ORAL
  Filled 2013-09-26: qty 1

## 2013-09-26 MED ORDER — HYDROCODONE-ACETAMINOPHEN 5-325 MG PO TABS: 1.0000 | ORAL_TABLET | ORAL | Status: DC | PRN

## 2013-09-26 NOTE — ED Provider Notes (Signed)
Medical screening examination/treatment/procedure(s) were conducted as a shared visit with non-physician practitioner(s) and myself.  I personally evaluated the patient during the encounter.   EKG Interpretation None      Patient here for back pain, pain began after bending over last week. Some radition down both of his legs. No red flags. No concern for cauda equina. Stable for discharge.  Evelina Bucy, MD 09/26/13 [phone]

## 2013-09-26 NOTE — ED Notes (Signed)
Pt reports bending over last week and onset of lower back pain that radiates down bilateral legs. Denies any urinary or bowel incontinence. Pain increased today. Ambulatory at triage.

## 2013-09-26 NOTE — ED Provider Notes (Signed)
CSN: [id number]     Arrival date & time 09/26/13  1546 History   First MD Initiated Contact with Patient 09/26/13 1711     This chart was scribed for non-physician practitioner, Clayton Bibles PA-C working with Evelina Bucy, MD by Forrestine Him, ED Scribe. This patient was seen in room TR07C/TR07C and the patient's care was started at 5:12 PM.   Chief Complaint  Patient presents with  . Back Pain   The history is provided by the patient. No language interpreter was used.    HPI Comments: Tony Patrick is a 63 y.o. male with a PMHx of CAD, GERD, and dyslipidemia who presents to the Emergency Department complaining of constant, moderate lower back pain x 1 week that has progressively worsened. Pt states pain shoots into his lower extremities. Pt attributes pain to bending over to get something last Sunday. States he got "stuck" attempting to come back up resulting in a  "pull" to his back afterwards. He has sense experienced ongoing back pain. Discomfort is exacerbated with certain positions, movement, and bearing weight. He has tried OTC Ibuprofen with mild improvement for symptoms. He denies any fever, CP, SOB, cough, nausea, vomiting, diarrhea, urinary frequency, abdominal pain, testicular pain. No weakness, numbness, or loss of sensation. He denies any bowel or urinary incontinence. Tony Patrick admits to a history of kidney stones but states current symptoms do not fee like what he has experienced with a kidney stone in the past.  Past Medical History  Diagnosis Date  . CAD (coronary artery disease)     Anterior MI, [facility], 1995, no PCI, total LAD  / catheterization 2001, 30% LAD beyond the origin of a large diagonal, circumflex normal, RCA normal, anterolateral and apical  akinesis, ejection fraction 25-35% range  . Kidney stones 1990's  . GERD (gastroesophageal reflux disease)   . S/P colectomy     Benign tumor  . Dyslipidemia   . Ejection fraction < 50%     EF 25-35%, catheterization 2001 / EF  20-25%, global hypokinesis, echo in June, 2012  . Hypoxia     Pneumonia, hospitalization, June, 2012   Past Surgical History  Procedure Laterality Date  . Colectomy  1981    benign mass  . Leg surgery      benign knot  . Pleural scarification  1978   Family History  Problem Relation Age of Onset  . Diabetes Mother   . Hypertension Mother    History  Substance Use Topics  . Smoking status: Former Smoker -- 2.00 packs/day for 32 years    Types: Cigarettes    Quit date: 04/14/1992  . Smokeless tobacco: Never Used  . Alcohol Use: No    Review of Systems  Constitutional: Negative for fever and chills.  Respiratory: Negative for shortness of breath.   Cardiovascular: Negative for chest pain.  Gastrointestinal: Negative for nausea, vomiting and abdominal pain.  Genitourinary: Negative for dysuria.  Musculoskeletal: Positive for back pain.  Neurological: Negative for weakness and numbness.  All other systems reviewed and are negative.     Allergies  Ivp dye and Morphine and related  Home Medications   Prior to Admission medications   Medication Sig Start Date End Date Taking? Authorizing Provider  aspirin 81 MG tablet Take 81 mg by mouth daily.      Historical Provider, MD  carvedilol (COREG) 25 MG tablet Take 1 tablet (25 mg total) by mouth 2 (two) times daily. 04/15/11 04/14/12  Carlena Bjornstad, MD  enalapril (VASOTEC) 20 MG tablet Take 20 mg by mouth 2 (two) times daily.      Historical Provider, MD  loratadine (CLARITIN) 10 MG tablet Take 10 mg by mouth daily.      Historical Provider, MD  meloxicam (MOBIC) 7.5 MG tablet Take 7.5 mg by mouth daily.     Historical Provider, MD  omeprazole (PRILOSEC) 20 MG capsule Take 1 capsule by mouth Daily. 07/17/10   Historical Provider, MD  simvastatin (ZOCOR) 40 MG tablet Take 40 mg by mouth at bedtime.     Historical Provider, MD   Triage Vitals: BP 146/89  Pulse 76  Temp(Src) 97.4 F (36.3 C) (Oral)  Resp 18  Ht 5\' 9"  (1.753 m)   Wt 230 lb (104.327 kg)  BMI 33.95 kg/m2  SpO2 94%   Physical Exam  Nursing note and vitals reviewed. Constitutional: He appears well-developed and well-nourished. No distress.  HENT:  Head: Normocephalic and atraumatic.  Neck: Neck supple.  Pulmonary/Chest: Effort normal.  Abdominal: Soft. Bowel sounds are normal. He exhibits no mass. There is no tenderness. There is no rebound and no guarding.  Musculoskeletal:  Lower extremities:  Strength 5/5, sensation intact, distal pulses intact.    Tenderness to palpation over lumbar spine No stepoffs of crepitus noted  Neurological: He is alert.  Skin: He is not diaphoretic.    ED Course  Procedures (including critical care time)  DIAGNOSTIC STUDIES: Oxygen Saturation is 94% on RA, Adequate by my interpretation.    COORDINATION OF CARE: 5:35 PM- Will give Percocet in ED. Will order DG lumbar spine complete. Discussed treatment plan with pt at bedside and pt agreed to plan.     Labs Review Labs Reviewed - No data to display  Imaging Review Dg Lumbar Spine Complete  09/26/2013   CLINICAL DATA:  Back pain.  EXAM: LUMBAR SPINE - COMPLETE 4+ VIEW  COMPARISON:  None.  FINDINGS: Mild loss of the normal lumbar lordosis. grade I retrolisthesis of L4 on L5 measuring 3 mm. Aortoiliac atherosclerosis. Vertebral body height preserved. Mild lumbar spondylosis is present with disc space narrowing most pronounced at L2-L3. Calcified sutures present in the midline of the abdomen.  IMPRESSION: Mild lumbar spondylosis.  No acute abnormality.   Electronically Signed   By: Dereck Ligas M.D.   On: 09/26/2013 19:22     EKG Interpretation None      5:36 PM Dr Mingo Amber made aware of the patient.    MDM   Final diagnoses:  Lumbar pain with radiation down both legs    Afebrile, nontoxic patient with mechanical low back pain. Tenderness over lumbar area.  Neurovascularly intact.  Xray shows grade 1 retrolithesis, disc space narrowing L2-3.  No red  flags by history or exam.  Also seen by Dr Mingo Amber.  Feeling better after Robaxin.  D/C home with robaxin and norco.  Given ortho and neurosurgery follow up per patient request.  Also PCP follow up.  Pt made aware of Aortoiliac atherosclerosis. Discussed result, findings, treatment, and follow up  with patient.  Pt given return precautions.  Pt verbalizes understanding and agrees with plan.      I personally performed the services described in this documentation, which was scribed in my presence. The recorded information has been reviewed and is accurate.    Clayton Bibles, PA-C 09/26/13 1939

## 2013-09-26 NOTE — Discharge Instructions (Signed)
Read the information below.  Use the prescribed medication as directed.  Please discuss all new medications with your pharmacist.  Do not take additional tylenol while taking the prescribed pain medication to avoid overdose.  You may return to the Emergency Department at any time for worsening condition or any new symptoms that concern you.   If you develop fevers, loss of control of bowel or bladder, weakness or numbness in your legs, or are unable to walk, return to the ER for a recheck.   Back Pain, Adult Low back pain is very common. About 1 in 5 people have back pain.The cause of low back pain is rarely dangerous. The pain often gets better over time.About half of people with a sudden onset of back pain feel better in just 2 weeks. About 8 in 10 people feel better by 6 weeks.  CAUSES Some common causes of back pain include:  Strain of the muscles or ligaments supporting the spine.  Wear and tear (degeneration) of the spinal discs.  Arthritis.  Direct injury to the back. DIAGNOSIS Most of the time, the direct cause of low back pain is not known.However, back pain can be treated effectively even when the exact cause of the pain is unknown.Answering your caregiver's questions about your overall health and symptoms is one of the most accurate ways to make sure the cause of your pain is not dangerous. If your caregiver needs more information, he or she may order lab work or imaging tests (X-rays or MRIs).However, even if imaging tests show changes in your back, this usually does not require surgery. HOME CARE INSTRUCTIONS For many people, back pain returns.Since low back pain is rarely dangerous, it is often a condition that people can learn to [facility] their own.   Remain active. It is stressful on the back to sit or stand in one place. Do not sit, drive, or stand in one place for more than 30 minutes at a time. Take short walks on level surfaces as soon as pain allows.Try to increase the  length of time you walk each day.  Do not stay in bed.Resting more than 1 or 2 days can delay your recovery.  Do not avoid exercise or work.Your body is made to move.It is not dangerous to be active, even though your back may hurt.Your back will likely heal faster if you return to being active before your pain is gone.  Pay attention to your body when you bend and lift. Many people have less discomfortwhen lifting if they bend their knees, keep the load close to their bodies,and avoid twisting. Often, the most comfortable positions are those that put less stress on your recovering back.  Find a comfortable position to sleep. Use a firm mattress and lie on your side with your knees slightly bent. If you lie on your back, put a pillow under your knees.  Only take over-the-counter or prescription medicines as directed by your caregiver. Over-the-counter medicines to reduce pain and inflammation are often the most helpful.Your caregiver may prescribe muscle relaxant drugs.These medicines help dull your pain so you can more quickly return to your normal activities and healthy exercise.  Put ice on the injured area.  Put ice in a plastic bag.  Place a towel between your skin and the bag.  Leave the ice on for 15-20 minutes, 03-04 times a day for the first 2 to 3 days. After that, ice and heat may be alternated to reduce pain and spasms.  Ask  your caregiver about trying back exercises and gentle massage. This may be of some benefit.  Avoid feeling anxious or stressed.Stress increases muscle tension and can worsen back pain.It is important to recognize when you are anxious or stressed and learn ways to manage it.Exercise is a great option. SEEK MEDICAL CARE IF:  You have pain that is not relieved with rest or medicine.  You have pain that does not improve in 1 week.  You have new symptoms.  You are generally not feeling well. SEEK IMMEDIATE MEDICAL CARE IF:   You have pain that  radiates from your back into your legs.  You develop new bowel or bladder control problems.  You have unusual weakness or numbness in your arms or legs.  You develop nausea or vomiting.  You develop abdominal pain.  You feel faint. Document Released: 12/31/2004 Document Revised: 07/02/2011 Document Reviewed: 05/04/2013 [facility] Patient Information 2015 [place], [place]. This information is not intended to replace advice given to you by your health care provider. Make sure you discuss any questions you have with your health care provider.  Radicular Pain Radicular pain in either the arm or leg is usually from a bulging or herniated disk in the spine. A piece of the herniated disk may press against the nerves as the nerves exit the spine. This causes pain which is felt at the tips of the nerves down the arm or leg. Other causes of radicular pain may include:  Fractures.  Heart disease.  Cancer.  An abnormal and usually degenerative state of the nervous system or nerves (neuropathy). Diagnosis may require CT or MRI scanning to determine the primary cause.  Nerves that start at the neck (nerve roots) may cause radicular pain in the outer shoulder and arm. It can spread down to the thumb and fingers. The symptoms vary depending on which nerve root has been affected. In most cases radicular pain improves with conservative treatment. Neck problems may require physical therapy, a neck collar, or cervical traction. Treatment may take many weeks, and surgery may be considered if the symptoms do not improve.  Conservative treatment is also recommended for sciatica. Sciatica causes pain to radiate from the lower back or buttock area down the leg into the foot. Often there is a history of back problems. Most patients with sciatica are better after 2 to 4 weeks of rest and other supportive care. Short term bed rest can reduce the disk pressure considerably. Sitting, however, is not a good position since  this increases the pressure on the disk. You should avoid bending, lifting, and all other activities which make the problem worse. Traction can be used in severe cases. Surgery is usually reserved for patients who do not improve within the first months of treatment. Only take over-the-counter or prescription medicines for pain, discomfort, or fever as directed by your caregiver. Narcotics and muscle relaxants may help by relieving more severe pain and spasm and by providing mild sedation. Cold or massage can give significant relief. Spinal manipulation is not recommended. It can increase the degree of disc protrusion. Epidural steroid injections are often effective treatment for radicular pain. These injections deliver medicine to the spinal nerve in the space between the protective covering of the spinal cord and back bones (vertebrae). Your caregiver can give you more information about steroid injections. These injections are most effective when given within two weeks of the onset of pain.  You should see your caregiver for follow up care as recommended. A program for  neck and back injury rehabilitation with stretching and strengthening exercises is an important part of management.  SEEK IMMEDIATE MEDICAL CARE IF:  You develop increased pain, weakness, or numbness in your arm or leg.  You develop difficulty with bladder or bowel control.  You develop abdominal pain. Document Released: 02/08/2004 Document Revised: 03/25/2011 Document Reviewed: 04/25/2008 [facility] Patient Information 2015 [place], [place]. This information is not intended to replace advice given to you by your health care provider. Make sure you discuss any questions you have with your health care provider.

## 2014-03-09 ENCOUNTER — Encounter: Payer: Self-pay | Admitting: Internal Medicine

## 2014-04-28 ENCOUNTER — Ambulatory Visit (INDEPENDENT_AMBULATORY_CARE_PROVIDER_SITE_OTHER): Payer: Self-pay | Admitting: Internal Medicine

## 2014-04-28 ENCOUNTER — Encounter: Payer: Self-pay | Admitting: Internal Medicine

## 2014-04-28 VITALS — BP 122/80 | HR 60 | Ht 68.25 in | Wt 229.2 lb

## 2014-04-28 DIAGNOSIS — Z1211 Encounter for screening for malignant neoplasm of colon: Secondary | ICD-10-CM

## 2014-04-28 NOTE — Patient Instructions (Signed)
You have been scheduled for a colonoscopy. Please follow written instructions given to you at your visit today.  Please pick up your prep supplies at the pharmacy within the next 1-3 days. If you use inhalers (even only as needed), please bring them with you on the day of your procedure. Your physician has requested that you go to www.startemmi.com and enter the access code given to you at your visit today. This web site gives a general overview about your procedure. However, you should still follow specific instructions given to you by our office regarding your preparation for the procedure.  Thank you for choosing me and [facility].  Silvano Rusk, MD., Marval Regal

## 2014-04-28 NOTE — Progress Notes (Signed)
   Subjective:    Patient ID: Tony Patrick, male    DOB: 1950-10-09, 64 y.o.   MRN: [id number] Cc: colon cancer screening HPI Very nice 64 yo wm without GI sxs here with wife about scheduling screening colonoscopy. He has a hx of segmental sigmoid colon resection for a large lipoma when he was in the [employer] 1982.   Medications, allergies, past medical history, past surgical history, family history and social history are reviewed and updated in the EMR.  Review of Systems negative    Objective:   Physical Exam @BP  122/80 mmHg  Pulse 60  Ht 5' 8.25" (1.734 m)  Wt 229 lb 4 oz (103.987 kg)  BMI 34.58 kg/m2@  General:  NAD Eyes:   anicteric Lungs:  clear Heart:: S1S2 no rubs, murmurs or gallops Abdomen:  soft and nontender, BS+ Ext:   no edema, cyanosis or clubbing      Assessment & Plan:  Colon cancer screening -   Plan:   schedule screening colonoscopy The risks and benefits as well as alternatives of endoscopic procedure(s) have been discussed and reviewed. All questions answered. The patient agrees to proceed.  QZ:ESPQZ, Londell Moh, PA-C

## 2014-06-02 ENCOUNTER — Other Ambulatory Visit: Payer: Self-pay

## 2014-06-02 ENCOUNTER — Telehealth: Payer: Self-pay

## 2014-06-02 DIAGNOSIS — Z1211 Encounter for screening for malignant neoplasm of colon: Secondary | ICD-10-CM

## 2014-06-02 NOTE — Telephone Encounter (Signed)
Patient's wife notified of the need to change locations for his colonoscopy.  He is rescheduled to [facility] for 07/04/14 at 9:30.  I will mail him new instructions

## 2014-06-02 NOTE — Telephone Encounter (Signed)
-----   Message from Gatha Mayer, MD sent at 06/02/2014 12:05 PM EDT ----- Regarding: RE: ASA IV pt OK  We will get him set up at hospital   ----- Message -----    From: Osvaldo Angst, CRNA    Sent: 06/02/2014   6:25 AM      To: Gatha Mayer, MD Subject: ASA IV pt                                      Doc,  This pts EF is 20-25% so he does not qualify for anesthetic care at [facility]  Thanks,  Jenny Reichmann

## 2014-06-07 ENCOUNTER — Encounter: Payer: TRICARE For Life (TFL) | Admitting: Internal Medicine

## 2014-06-14 DIAGNOSIS — E785 Hyperlipidemia, unspecified: Secondary | ICD-10-CM | POA: Diagnosis not present

## 2014-06-14 DIAGNOSIS — R972 Elevated prostate specific antigen [PSA]: Secondary | ICD-10-CM | POA: Diagnosis not present

## 2014-06-14 DIAGNOSIS — I714 Abdominal aortic aneurysm, without rupture: Secondary | ICD-10-CM | POA: Diagnosis not present

## 2014-06-14 DIAGNOSIS — I2510 Atherosclerotic heart disease of native coronary artery without angina pectoris: Secondary | ICD-10-CM | POA: Diagnosis not present

## 2014-07-01 NOTE — Progress Notes (Signed)
07-01-14 1025 Spoke with Patti Martinique of Dr. Celesta Aver office- made aware no phone contact has been made with pt- x 7 attempts without any returned calls. Voice mail now full. Guy Toney,RN

## 2014-07-04 ENCOUNTER — Encounter (HOSPITAL_COMMUNITY)
Admission: RE | Disposition: A | Discharge status: Home / Self Care | Payer: Self-pay | Source: Ambulatory Visit | Attending: Internal Medicine

## 2014-07-04 ENCOUNTER — Ambulatory Visit (HOSPITAL_COMMUNITY): Payer: Medicare Other | Admitting: Anesthesiology

## 2014-07-04 ENCOUNTER — Ambulatory Visit (HOSPITAL_COMMUNITY)
Admission: RE | Admit: 2014-07-04 | Discharge: 2014-07-04 | Disposition: A | Discharge status: Home / Self Care | Payer: Medicare Other | Source: Ambulatory Visit | Attending: Internal Medicine | Admitting: Internal Medicine

## 2014-07-04 ENCOUNTER — Encounter (HOSPITAL_COMMUNITY): Payer: Self-pay

## 2014-07-04 DIAGNOSIS — Z9049 Acquired absence of other specified parts of digestive tract: Secondary | ICD-10-CM | POA: Diagnosis not present

## 2014-07-04 DIAGNOSIS — Z1211 Encounter for screening for malignant neoplasm of colon: Secondary | ICD-10-CM | POA: Diagnosis not present

## 2014-07-04 DIAGNOSIS — K219 Gastro-esophageal reflux disease without esophagitis: Secondary | ICD-10-CM | POA: Diagnosis not present

## 2014-07-04 DIAGNOSIS — E785 Hyperlipidemia, unspecified: Secondary | ICD-10-CM | POA: Insufficient documentation

## 2014-07-04 DIAGNOSIS — I252 Old myocardial infarction: Secondary | ICD-10-CM | POA: Diagnosis not present

## 2014-07-04 DIAGNOSIS — I2510 Atherosclerotic heart disease of native coronary artery without angina pectoris: Secondary | ICD-10-CM | POA: Insufficient documentation

## 2014-07-04 DIAGNOSIS — Z79899 Other long term (current) drug therapy: Secondary | ICD-10-CM | POA: Diagnosis not present

## 2014-07-04 DIAGNOSIS — Z79891 Long term (current) use of opiate analgesic: Secondary | ICD-10-CM | POA: Insufficient documentation

## 2014-07-04 DIAGNOSIS — Z85828 Personal history of other malignant neoplasm of skin: Secondary | ICD-10-CM | POA: Insufficient documentation

## 2014-07-04 DIAGNOSIS — Z791 Long term (current) use of non-steroidal anti-inflammatories (NSAID): Secondary | ICD-10-CM | POA: Diagnosis not present

## 2014-07-04 DIAGNOSIS — Z87891 Personal history of nicotine dependence: Secondary | ICD-10-CM | POA: Insufficient documentation

## 2014-07-04 DIAGNOSIS — Z7982 Long term (current) use of aspirin: Secondary | ICD-10-CM | POA: Diagnosis not present

## 2014-07-04 HISTORY — PX: COLONOSCOPY WITH PROPOFOL: SHX5780

## 2014-07-04 SURGERY — COLONOSCOPY WITH PROPOFOL
Anesthesia: Monitor Anesthesia Care

## 2014-07-04 MED ORDER — PROPOFOL 10 MG/ML IV BOLUS
INTRAVENOUS | Status: DC | PRN
  Administered 2014-07-04 (×5): 20 mg via INTRAVENOUS
  Administered 2014-07-04: 50 mg via INTRAVENOUS
  Administered 2014-07-04 (×2): 20 mg via INTRAVENOUS
  Administered 2014-07-04: 50 mg via INTRAVENOUS

## 2014-07-04 MED ORDER — CARVEDILOL 25 MG PO TABS: 25.0000 mg | ORAL_TABLET | Freq: Two times a day (BID) | ORAL | Status: DC

## 2014-07-04 MED ORDER — PROPOFOL 10 MG/ML IV BOLUS
INTRAVENOUS | Status: AC
  Filled 2014-07-04: qty 20

## 2014-07-04 MED ORDER — LACTATED RINGERS IV SOLN
INTRAVENOUS | Status: DC
  Administered 2014-07-04: 1000 mL via INTRAVENOUS

## 2014-07-04 MED ORDER — SODIUM CHLORIDE 0.9 % IV SOLN: INTRAVENOUS | Status: DC

## 2014-07-04 SURGICAL SUPPLY — 22 items

## 2014-07-04 NOTE — H&P (Signed)
[facility] History and Physical   Primary Care Physician:  Terald Sleeper, PA-C   Reason for Procedure:  Colon cancer screening  Plan:    Colonoscopy The risks and benefits as well as alternatives of endoscopic procedure(s) have been discussed and reviewed. All questions answered. The patient agrees to proceed.      HPI: Tony Patrick is a 64 y.o. male here for screening colonoscopy.   Past Medical History  Diagnosis Date  . CAD (coronary artery disease)     Anterior MI, [facility], 1995, no PCI, total LAD  / catheterization 2001, 30% LAD beyond the origin of a large diagonal, circumflex normal, RCA normal, anterolateral and apical  akinesis, ejection fraction 25-35% range  . Kidney stones 1990's  . GERD (gastroesophageal reflux disease)   . S/P colectomy     Benign tumor  . Dyslipidemia   . Ejection fraction < 50%     EF 25-35%, catheterization 2001 / EF 20-25%, global hypokinesis, echo in June, 2012  . Hypoxia     Pneumonia, hospitalization, June, 2012  . MI (myocardial infarction)   . Basal cell carcinoma     Past Surgical History  Procedure Laterality Date  . Colectomy  1981    benign mass  . Leg surgery Right     cyst  . Pleural scarification  1978  . Chest tube insertion      Prior to Admission medications   Medication Sig Start Date End Date Taking? Authorizing Provider  aspirin 81 MG tablet Take 81 mg by mouth daily.     Yes Historical Provider, MD  carvedilol (COREG) 25 MG tablet Take 1 tablet (25 mg total) by mouth 2 (two) times daily. 04/15/11 06/15/14 Yes Carlena Bjornstad, MD  enalapril (VASOTEC) 20 MG tablet Take 20 mg by mouth 2 (two) times daily.     Yes Historical Provider, MD  HYDROcodone-homatropine (HYCODAN) 5-1.5 MG/5ML syrup Take 5 mLs by mouth every 6 (six) hours as needed for cough.   Yes Historical Provider, MD  ibuprofen (ADVIL,MOTRIN) 800 MG tablet Take 800 mg by mouth every 8 (eight) hours as needed for moderate pain.   Yes Historical  Provider, MD  ipratropium-albuterol (DUONEB) 0.5-2.5 (3) MG/3ML SOLN Take 3 mLs by nebulization every 6 (six) hours as needed (SOB).   Yes Historical Provider, MD  omeprazole (PRILOSEC) 20 MG capsule Take 1 capsule by mouth Daily. 07/17/10  Yes Historical Provider, MD  simvastatin (ZOCOR) 40 MG tablet Take 40 mg by mouth at bedtime.    Yes Historical Provider, MD  tamsulosin (FLOMAX) 0.4 MG CAPS capsule Take 0.4 mg by mouth 2 (two) times daily.   Yes Historical Provider, MD  albuterol (PROVENTIL HFA;VENTOLIN HFA) 108 (90 BASE) MCG/ACT inhaler Inhale 2 puffs into the lungs every 6 (six) hours as needed for wheezing or shortness of breath.    Historical Provider, MD  diclofenac sodium (VOLTAREN) 1 % GEL Apply 1 application topically 3 (three) times daily as needed (for hands).    Historical Provider, MD    Current Facility-Administered Medications  Medication Dose Route Frequency Provider Last Rate Last Dose  . 0.9 %  sodium chloride infusion   Intravenous Continuous Gatha Mayer, MD      . lactated ringers infusion   Intravenous Continuous Gatha Mayer, MD 125 mL/hr at 07/04/14 0817 1,000 mL at 07/04/14 0817    Allergies as of 06/02/2014 - Review Complete 04/28/2014  Allergen Reaction Noted  . Ivp dye [iodinated diagnostic agents]  07/12/2010  . Morphine and related Nausea And Vomiting 07/12/2010    Family History  Problem Relation Age of Onset  . Diabetes Mother   . Hypertension Mother   . Cancer Maternal Grandmother     type unknown  . Melanoma Brother   . Cancer Brother     type unknown  . Prostatitis Brother     History   Social History  . Marital Status: Married    Spouse Name: N/A  . Number of Children: 1  . Years of Education: N/A   Occupational History  . retired    Social History Main Topics  . Smoking status: Former Smoker -- 2.00 packs/day for 32 years    Types: Cigarettes    Quit date: 04/14/1992  . Smokeless tobacco: Never Used  . Alcohol Use: No  .  Drug Use: No  . Sexual Activity: Not on file   Other Topics Concern  . 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  Physical Exam: Vital signs in last 24 hours: Temp:  [97.5 F (36.4 C)] 97.5 F (36.4 C) (06/20 0811) Pulse Rate:  [57] 57 (06/20 0811) Resp:  [12] 12 (06/20 0811) BP: (138)/(86) 138/86 mmHg (06/20 0811) SpO2:  [96 %] 96 % (06/20 0811) Weight:  [222 lb (100.699 kg)] 222 lb (100.699 kg) (06/20 0811)   General:   Alert,  Well-developed, well-nourished, pleasant and cooperative in NAD Lungs:  Clear throughout to auscultation.   Heart:  Regular rate and rhythm; no murmurs, clicks, rubs,  or gallops. Abdomen:  Soft, nontender and nondistended. Normal bowel sounds.   Neuro/Psych:  Alert and cooperative. Normal mood and affect. A and O x 3   @Carl  Simonne Maffucci, MD, [facility] [phone] (pager) 07/04/2014 9:23 AM@

## 2014-07-04 NOTE — Anesthesia Preprocedure Evaluation (Addendum)
Anesthesia Evaluation  Patient identified by MRN, date of birth, ID band Patient awake    Reviewed: Allergy & Precautions, NPO status , Patient's Chart, lab work & pertinent test results  Airway Mallampati: II  TM Distance: >3 FB Neck ROM: Full    Dental no notable dental hx. (+) Partial Lower   Pulmonary former smoker,  breath sounds clear to auscultation  Pulmonary exam normal       Cardiovascular Exercise Tolerance: Good + CAD and + Past MI (1995) Normal cardiovascular examRhythm:Regular Rate:Normal  Anterior MI, [facility], 1995, no PCI, total LAD / catheterization 2001, 30% LAD beyond the origin of a large diagonal, circumflex normal, RCA normal, anterolateral and apical akinesis, ejection fraction 25-35% range   Neuro/Psych negative neurological ROS  negative psych ROS   GI/Hepatic negative GI ROS, Neg liver ROS,   Endo/Other  negative endocrine ROS  Renal/GU negative Renal ROS  negative genitourinary   Musculoskeletal negative musculoskeletal ROS (+)   Abdominal   Peds negative pediatric ROS (+)  Hematology negative hematology ROS (+)   Anesthesia Other Findings   Reproductive/Obstetrics negative OB ROS                            Anesthesia Physical Anesthesia Plan  ASA: III  Anesthesia Plan: MAC   Post-op Pain Management:    Induction:   Airway Management Planned: Natural Airway  Additional Equipment:   Intra-op Plan:   Post-operative Plan:   Informed Consent: I have reviewed the patients History and Physical, chart, labs and discussed the procedure including the risks, benefits and alternatives for the proposed anesthesia with the patient or authorized representative who has indicated his/her understanding and acceptance.   Dental advisory given  Plan Discussed with: CRNA  Anesthesia Plan Comments:         Anesthesia Quick Evaluation

## 2014-07-04 NOTE — Discharge Instructions (Addendum)
YOU HAD AN ENDOSCOPIC PROCEDURE TODAY: Refer to the procedure report and other information in the discharge instructions given to you for any specific questions about what was found during the examination. If this information does not answer your questions, please call Dr. Celesta Aver office at [phone] to clarify.   YOU SHOULD EXPECT: Some feelings of bloating in the abdomen. Passage of more gas than usual. Walking can help get rid of the air that was put into your GI tract during the procedure and reduce the bloating. If you had a lower endoscopy (such as a colonoscopy or flexible sigmoidoscopy) you may notice spotting of blood in your stool or on the toilet paper. Some abdominal soreness may be present for a day or two, also.  DIET: Your first meal following the procedure should be a light meal and then it is ok to progress to your normal diet. A half-sandwich or bowl of soup is an example of a good first meal. Heavy or fried foods are harder to digest and may make you feel nauseous or bloated. Drink plenty of fluids but you should avoid alcoholic beverages for 24 hours.   ACTIVITY: Your care partner should take you home directly after the procedure. You should plan to take it easy, moving slowly for the rest of the day. You can resume normal activity the day after the procedure however YOU SHOULD NOT DRIVE, use power tools, machinery or perform tasks that involve climbing or major physical exertion for 24 hours (because of the sedation medicines used during the test).   SYMPTOMS TO REPORT IMMEDIATELY: A gastroenterologist can be reached at any hour. Please call [phone]  for any of the following symptoms:  Following lower endoscopy (colonoscopy, flexible sigmoidoscopy) Excessive amounts of blood in the stool  Significant tenderness, worsening of abdominal pains  Swelling of the abdomen that is new, acute  Fever of 100 or higher     Colonoscopy, Care After Refer to this sheet in  the next few weeks. These instructions provide you with information on caring for yourself after your procedure. Your health care provider may also give you more specific instructions. Your treatment has been planned according to current medical practices, but problems sometimes occur. Call your health care provider if you have any problems or questions after your procedure. WHAT TO EXPECT AFTER THE PROCEDURE  After your procedure, it is typical to have the following:  A small amount of blood in your stool.  Moderate amounts of gas and mild abdominal cramping or bloating. HOME CARE INSTRUCTIONS  Do not drive, operate machinery, or sign important documents for 24 hours.  You may shower and resume your regular physical activities, but move at a slower pace for the first 24 hours.  Take frequent rest periods for the first 24 hours.  Walk around or put a warm pack on your abdomen to help reduce abdominal cramping and bloating.  Drink enough fluids to keep your urine clear or pale yellow.  You may resume your normal diet as instructed by your health care provider. Avoid heavy or fried foods that are hard to digest.  Avoid drinking alcohol for 24 hours or as instructed by your health care provider.  Only take over-the-counter or prescription medicines as directed by your health care provider.  If a tissue sample (biopsy) was taken during your procedure:  Do not take aspirin or blood thinners for 7 days, or as instructed by your health care provider.  Do not drink alcohol for 7  days, or as instructed by your health care provider.  Eat soft foods for the first 24 hours. SEEK MEDICAL CARE IF: You have persistent spotting of blood in your stool 2-3 days after the procedure. SEEK IMMEDIATE MEDICAL CARE IF:  You have more than a small spotting of blood in your stool.  You pass large blood clots in your stool.  Your abdomen is swollen (distended).  You have nausea or vomiting.  You  have a fever.  You have increasing abdominal pain that is not relieved with medicine. Document Released: 08/15/2003 Document Revised: 10/21/2012 Document Reviewed: 09/07/2012 [facility] Patient Information 2015 [place], [place]. This information is not intended to replace advice given to you by your health care provider. Make sure you discuss any questions you have with your health care provider.  Conscious Sedation, Adult, Care After Refer to this sheet in the next few weeks. These instructions provide you with information on caring for yourself after your procedure. Your health care provider may also give you more specific instructions. Your treatment has been planned according to current medical practices, but problems sometimes occur. Call your health care provider if you have any problems or questions after your procedure. WHAT TO EXPECT AFTER THE PROCEDURE  After your procedure:  You may feel sleepy, clumsy, and have poor balance for several hours.  Vomiting may occur if you eat too soon after the procedure. HOME CARE INSTRUCTIONS  Do not participate in any activities where you could become injured for at least 24 hours. Do not:  Drive.  Swim.  Ride a bicycle.  Operate heavy machinery.  Cook.  Use power tools.  Climb ladders.  Work from a high place.  Do not make important decisions or sign legal documents until you are improved.  If you vomit, drink water, juice, or soup when you can drink without vomiting. Make sure you have little or no nausea before eating solid foods.  Only take over-the-counter or prescription medicines for pain, discomfort, or fever as directed by your health care provider.  Make sure you and your family fully understand everything about the medicines given to you, including what side effects may occur.  You should not drink alcohol, take sleeping pills, or take medicines that cause drowsiness for at least 24 hours.  If you smoke, do not smoke  without supervision.  If you are feeling better, you may resume normal activities 24 hours after you were sedated.  Keep all appointments with your health care provider. SEEK MEDICAL CARE IF:  Your skin is pale or bluish in color.  You continue to feel nauseous or vomit.  Your pain is getting worse and is not helped by medicine.  You have bleeding or swelling.  You are still sleepy or feeling clumsy after 24 hours. SEEK IMMEDIATE MEDICAL CARE IF:  You develop a rash.  You have difficulty breathing.  You develop any type of allergic problem.  You have a fever. MAKE SURE YOU:  Understand these instructions.  Will watch your condition.  Will get help right away if you are not doing well or get worse. Document Released: 10/21/2012 Document Reviewed: 10/21/2012 [facility] Patient Information 2015 [place], [place]. This information is not intended to replace advice given to you by your health care provider. Make sure you discuss any questions you have with your health care provider.

## 2014-07-04 NOTE — Transfer of Care (Signed)
Immediate Anesthesia Transfer of Care Note  Patient: Tony Patrick  Procedure(s) Performed: Procedure(s): COLONOSCOPY WITH PROPOFOL (N/A)  Patient Location: PACU  Anesthesia Type:MAC  Level of Consciousness: sedated  Airway & Oxygen Therapy: Patient Spontanous Breathing and Patient connected to face mask oxygen  Post-op Assessment: Report given to RN and Post -op Vital signs reviewed and stable  Post vital signs: Reviewed and stable  Last Vitals:  Filed Vitals:   07/04/14 0932  BP: 153/91  Pulse: 66  Temp:   Resp: 14    Complications: No apparent anesthesia complications

## 2014-07-04 NOTE — Op Note (Signed)
[facility] [place], [address]   COLONOSCOPY PROCEDURE REPORT  PATIENT: Tony Patrick, Tony Patrick  MR#: [id number] BIRTHDATE: [birth year] , 40  yrs. old GENDER: male ENDOSCOPIST: Gatha Mayer, MD, [facility] PROCEDURE DATE:  07/04/2014 PROCEDURE:   Colonoscopy, screening First Screening Colonoscopy - Avg.  risk and is 50 yrs.  old or older Yes.  Prior Negative Screening - Now for repeat screening. N/A  History of Adenoma - Now for follow-up colonoscopy & has been > or = to 3 yrs.  N/A  Polyps removed today? No Recommend repeat exam, <10 yrs? No ASA CLASS:   Class II INDICATIONS:Screening for colonic neoplasia and Colorectal Neoplasm Risk Assessment for this procedure is average risk. MEDICATIONS: Monitored anesthesia care and Per Anesthesia  DESCRIPTION OF PROCEDURE:   After the risks benefits and alternatives of the procedure were thoroughly explained, informed consent was obtained.  The digital rectal exam revealed no abnormalities of the rectum, revealed the prostate was not enlarged, and revealed no prostatic nodules.   The EC-[id number] ([id number])  endoscope was introduced through the anus and advanced to the cecum, which was identified by both the appendix and ileocecal valve. No adverse events experienced.   The quality of the prep was good.  The instrument was then slowly withdrawn as the colon was fully examined. Estimated blood loss is zero unless otherwise noted in this procedure report.      COLON FINDINGS: A normal appearing cecum, ileocecal valve, and appendiceal orifice were identified.  The ascending, transverse, descending, sigmoid colon, and rectum appeared unremarkable.  I did not see an anastomosis from 1992 segmental resection. Retroflexed views revealed no abnormalities. The time to cecum = 1.4 Withdrawal time = 12.3   The scope was withdrawn and the procedure completed. COMPLICATIONS: There were no immediate complications.  ENDOSCOPIC  IMPRESSION: Normal colonoscopy - good prep - first screening  RECOMMENDATIONS: Repeat colonoscopy 10 years.  eSigned:  Gatha Mayer, MD, [facility] 07/04/2014 10:07 AM   cc: The Patient and Particia Nearing, PA-C

## 2014-07-04 NOTE — Anesthesia Postprocedure Evaluation (Signed)
  Anesthesia Post-op Note  Patient: Tony Patrick  Procedure(s) Performed: Procedure(s) (LRB): COLONOSCOPY WITH PROPOFOL (N/A)  Patient Location: PACU  Anesthesia Type: MAC  Level of Consciousness: awake and alert   Airway and Oxygen Therapy: Patient Spontanous Breathing  Post-op Pain: mild  Post-op Assessment: Post-op Vital signs reviewed, Patient's Cardiovascular Status Stable, Respiratory Function Stable, Patent Airway and No signs of Nausea or vomiting  Last Vitals:  Filed Vitals:   07/04/14 1020  BP: 108/66  Pulse: 63  Temp:   Resp: 12    Post-op Vital Signs: stable   Complications: No apparent anesthesia complications

## 2014-07-05 ENCOUNTER — Encounter (HOSPITAL_COMMUNITY): Payer: Self-pay | Admitting: Internal Medicine

## 2014-07-05 ENCOUNTER — Telehealth: Payer: Self-pay

## 2014-07-05 NOTE — Telephone Encounter (Signed)
-----   Message from Gatha Mayer, MD sent at 07/04/2014 10:07 AM EDT ----- Regarding: cc please Please print and fax the colon report to Particia Nearing, PA-C and place 10 yr recall  Thanks

## 2014-07-05 NOTE — Telephone Encounter (Signed)
Recall entered and report faxed

## 2014-11-29 DIAGNOSIS — L03031 Cellulitis of right toe: Secondary | ICD-10-CM | POA: Diagnosis not present

## 2015-01-13 DIAGNOSIS — K219 Gastro-esophageal reflux disease without esophagitis: Secondary | ICD-10-CM | POA: Diagnosis not present

## 2015-01-13 DIAGNOSIS — I252 Old myocardial infarction: Secondary | ICD-10-CM | POA: Diagnosis not present

## 2015-01-13 DIAGNOSIS — Z79899 Other long term (current) drug therapy: Secondary | ICD-10-CM | POA: Diagnosis not present

## 2015-01-13 DIAGNOSIS — Z886 Allergy status to analgesic agent status: Secondary | ICD-10-CM | POA: Diagnosis not present

## 2015-01-13 DIAGNOSIS — Z91041 Radiographic dye allergy status: Secondary | ICD-10-CM | POA: Diagnosis not present

## 2015-01-13 DIAGNOSIS — Z87891 Personal history of nicotine dependence: Secondary | ICD-10-CM | POA: Diagnosis not present

## 2015-01-13 DIAGNOSIS — R040 Epistaxis: Secondary | ICD-10-CM | POA: Diagnosis not present

## 2015-01-13 DIAGNOSIS — Z7982 Long term (current) use of aspirin: Secondary | ICD-10-CM | POA: Diagnosis not present

## 2015-01-24 DIAGNOSIS — I714 Abdominal aortic aneurysm, without rupture: Secondary | ICD-10-CM | POA: Diagnosis not present

## 2015-01-24 DIAGNOSIS — R040 Epistaxis: Secondary | ICD-10-CM | POA: Diagnosis not present

## 2015-01-24 DIAGNOSIS — I700 Atherosclerosis of aorta: Secondary | ICD-10-CM | POA: Diagnosis not present

## 2015-01-24 DIAGNOSIS — E785 Hyperlipidemia, unspecified: Secondary | ICD-10-CM | POA: Diagnosis not present

## 2015-02-07 DIAGNOSIS — J3489 Other specified disorders of nose and nasal sinuses: Secondary | ICD-10-CM | POA: Diagnosis not present

## 2015-02-07 DIAGNOSIS — J342 Deviated nasal septum: Secondary | ICD-10-CM | POA: Diagnosis not present

## 2015-02-07 DIAGNOSIS — R0981 Nasal congestion: Secondary | ICD-10-CM | POA: Diagnosis not present

## 2015-02-07 DIAGNOSIS — J310 Chronic rhinitis: Secondary | ICD-10-CM | POA: Diagnosis not present

## 2015-03-30 ENCOUNTER — Ambulatory Visit (INDEPENDENT_AMBULATORY_CARE_PROVIDER_SITE_OTHER): Payer: Worker's Compensation

## 2015-03-30 ENCOUNTER — Ambulatory Visit (INDEPENDENT_AMBULATORY_CARE_PROVIDER_SITE_OTHER): Payer: Worker's Compensation | Admitting: Family Medicine

## 2015-03-30 ENCOUNTER — Encounter: Payer: Self-pay | Admitting: Family Medicine

## 2015-03-30 VITALS — BP 117/60 | HR 67 | Temp 96.7°F | Ht 69.0 in | Wt 242.6 lb

## 2015-03-30 DIAGNOSIS — S8992XA Unspecified injury of left lower leg, initial encounter: Secondary | ICD-10-CM | POA: Diagnosis not present

## 2015-03-30 DIAGNOSIS — S4992XA Unspecified injury of left shoulder and upper arm, initial encounter: Secondary | ICD-10-CM

## 2015-03-30 MED ORDER — TRAMADOL HCL 50 MG PO TABS: 50.0000 mg | ORAL_TABLET | Freq: Three times a day (TID) | ORAL | Status: DC | PRN

## 2015-03-30 NOTE — Progress Notes (Signed)
   HPI  Patient presents today here after a left shoulder and left knee injury at work.  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 He states that his shoulder pain seems to progressively worsen since that time. He has difficulty raising his shoulder over his head, lifting objects, has severe left sided anterior shoulder pain  His left knee is also hurting, he has popping sensation with bending. He is able to bear weight without problems other than limping   PMH: Smoking status noted ROS: Per HPI  Objective: BP 117/60 mmHg  Pulse 67  Temp(Src) 96.7 F (35.9 C) (Oral)  Ht 5\' 9"  (1.753 m)  Wt 242 lb 9.6 oz (110.043 kg)  BMI 35.81 kg/m2 Gen: NAD, alert, cooperative with exam HEENT: NCAT CV: RRR, good S1/S2, no murmur Resp: CTABL, no wheezes, non-labored Ext: No edema, warm Neuro: Alert and oriented, No gross deficits  MSK: L knee with very mild effudsion No eythema,  bruising, or gross deformity No joint line tenderness.  ligamentously intact to Lachman's and with varus and valgus stress.  Pain with mcMurrays test  L shoulder with severely limited ROM- cannot raise above 90 degrees Positive hawkins and empty can   DG shoulder- no acute findings DG knee- No acute findings  Assessment and plan:  # L shoulder pain Concern for rotator cuff injury Refer to orthopedics Avoiding NSAIDs with heart disease Plain film normal - awaiting read Tramadol for pain  # L knee pain Exam with only mild findings, with popping symptoms I am concerned for meniscal injury Plain film normal- awaiting read Refer to ortho    Orders Placed This Encounter  Procedures  . DG Shoulder Left    Standing Status: Future     Number of Occurrences:      Standing Expiration Date: 05/29/2016    Order Specific Question:  Reason for Exam (SYMPTOM   OR DIAGNOSIS REQUIRED)    Answer:  injury and pain    Order Specific Question:  Preferred imaging location?    Answer:  Internal  . DG Knee 1-2 Views Left    Standing Status: Future     Number of Occurrences:      Standing Expiration Date: 05/29/2016    Order Specific Question:  Reason for Exam (SYMPTOM  OR DIAGNOSIS REQUIRED)    Answer:  injury and pain    Order Specific Question:  Preferred imaging location?    Answer:  Internal  . Ambulatory referral to Orthopedic Surgery    Referral Priority:  Routine    Referral Type:  Surgical    Referral Reason:  Specialty Services Required    Requested Specialty:  Orthopedic Surgery    Number of Visits Requested:  1    Meds ordered this encounter  Medications  . traMADol (ULTRAM) 50 MG tablet    Sig: Take 1 tablet (50 mg total) by mouth every 8 (eight) hours as needed.    Dispense:  60 tablet    Refill:  0    Laroy Apple, MD [facility] 03/30/2015, 4:01 PM

## 2015-03-30 NOTE — Patient Instructions (Signed)
Great to meet you!  Come back if you need anything, I have asked that you be seen by orthopedics within 2 weeks  Try tramadol for pain.   Also use ice 15 minutes every 4-6 hours.

## 2015-03-31 ENCOUNTER — Telehealth: Payer: Self-pay | Admitting: Family Medicine

## 2015-03-31 NOTE — Telephone Encounter (Signed)
lmtcb

## 2015-04-03 NOTE — Telephone Encounter (Signed)
Spoke to pt - aware of results and referral

## 2015-04-06 ENCOUNTER — Telehealth: Payer: Self-pay

## 2015-04-06 DIAGNOSIS — S4992XA Unspecified injury of left shoulder and upper arm, initial encounter: Secondary | ICD-10-CM

## 2015-04-06 DIAGNOSIS — M75100 Unspecified rotator cuff tear or rupture of unspecified shoulder, not specified as traumatic: Secondary | ICD-10-CM | POA: Insufficient documentation

## 2015-04-06 DIAGNOSIS — S4990XA Unspecified injury of shoulder and upper arm, unspecified arm, initial encounter: Secondary | ICD-10-CM | POA: Insufficient documentation

## 2015-04-06 DIAGNOSIS — M75102 Unspecified rotator cuff tear or rupture of left shoulder, not specified as traumatic: Secondary | ICD-10-CM

## 2015-04-06 NOTE — Telephone Encounter (Signed)
Patient said he saw [facility] and they want him to have a MRI Left shoulder and knee and they said we would have to set up

## 2015-04-06 NOTE — Telephone Encounter (Signed)
MRI of the left shoulder for shoulder injury. This is been requested by orthopedic surgery.  Laroy Apple, MD [facility] 04/06/2015, 1:39 PM

## 2015-04-10 NOTE — Telephone Encounter (Signed)
Still trying to get authorized by [facility] adjustor

## 2015-05-16 ENCOUNTER — Encounter: Payer: Self-pay | Admitting: Cardiology

## 2015-05-16 ENCOUNTER — Ambulatory Visit (INDEPENDENT_AMBULATORY_CARE_PROVIDER_SITE_OTHER): Payer: Worker's Compensation | Admitting: Cardiology

## 2015-05-16 VITALS — BP 136/92 | HR 56 | Ht 69.0 in | Wt 242.8 lb

## 2015-05-16 DIAGNOSIS — Z01810 Encounter for preprocedural cardiovascular examination: Secondary | ICD-10-CM | POA: Diagnosis not present

## 2015-05-16 DIAGNOSIS — I255 Ischemic cardiomyopathy: Secondary | ICD-10-CM | POA: Diagnosis not present

## 2015-05-16 DIAGNOSIS — R931 Abnormal findings on diagnostic imaging of heart and coronary circulation: Secondary | ICD-10-CM

## 2015-05-16 DIAGNOSIS — R0989 Other specified symptoms and signs involving the circulatory and respiratory systems: Secondary | ICD-10-CM

## 2015-05-16 DIAGNOSIS — I2510 Atherosclerotic heart disease of native coronary artery without angina pectoris: Secondary | ICD-10-CM

## 2015-05-16 DIAGNOSIS — M25512 Pain in left shoulder: Secondary | ICD-10-CM

## 2015-05-16 DIAGNOSIS — I2583 Coronary atherosclerosis due to lipid rich plaque: Secondary | ICD-10-CM

## 2015-05-16 NOTE — Patient Instructions (Signed)

## 2015-05-16 NOTE — Progress Notes (Signed)
Cardiology Office Note    Date:  05/16/2015   ID:  Tony Patrick, DOB 02-11-1950, MRN [id number]  PCP:  Kenn File, MD  Cardiologist:   Candee Furbish, MD     History of Present Illness:  Tony Patrick is a 65 y.o. male former patient of Dr. Ron Parker here for preoperative risk assessment prior to left shoulder surgery by Dr. Gladstone Lighter.  Prior visit with Dr. Ron Parker was reviewed from 12/10/2010. Catheterization was done in 2001. Prior anterior myocardial infarction. Ejection fraction at that time was 25%. ICD was discussed but he did not wish to proceed. A nuclear study was recommended in 2012 that he did not wish to have that done. He is on beta blocker, ACE inhibitor, class I.  Doing very well, able to complete greater than 4 METS of activity without any difficulty. He is very active. Firefighter. Ex [employer] Still not interested in ICD, understands risk of sudden cardiac death. He is okay with this. His prior heart attack was 1995 and he has lived quite a long time since then.  Left knee surgery may need to be done in the future as well.    Past Medical History  Diagnosis Date  . CAD (coronary artery disease)     Anterior MI, [facility], 1995, no PCI, total LAD  / catheterization 2001, 30% LAD beyond the origin of a large diagonal, circumflex normal, RCA normal, anterolateral and apical  akinesis, ejection fraction 25-35% range  . Kidney stones 1990's  . GERD (gastroesophageal reflux disease)   . S/P colectomy     Benign tumor  . Dyslipidemia   . Ejection fraction < 50%     EF 25-35%, catheterization 2001 / EF 20-25%, global hypokinesis, echo in June, 2012  . Hypoxia     Pneumonia, hospitalization, June, 2012  . MI (myocardial infarction) ([place])   . Basal cell carcinoma     Past Surgical History  Procedure Laterality Date  . Colectomy  1981    benign mass  . Leg surgery Right     cyst  . Pleural scarification  1978  . Chest tube insertion    . Colonoscopy with propofol N/A  07/04/2014    Procedure: COLONOSCOPY WITH PROPOFOL;  Surgeon: Gatha Mayer, MD;  Location: [facility];  Service: Endoscopy;  Laterality: N/A;    Current Medications: Outpatient Prescriptions Prior to Visit  Medication Sig Dispense Refill  . albuterol (PROVENTIL HFA;VENTOLIN HFA) 108 (90 BASE) MCG/ACT inhaler Inhale 2 puffs into the lungs every 6 (six) hours as needed for wheezing or shortness of breath.    Marland Kitchen aspirin 81 MG tablet Take 81 mg by mouth daily.      . carvedilol (COREG) 25 MG tablet Take 1 tablet (25 mg total) by mouth 2 (two) times daily. 60 tablet 6  . diclofenac sodium (VOLTAREN) 1 % GEL Apply 1 application topically 3 (three) times daily as needed (for hands).    . enalapril (VASOTEC) 20 MG tablet Take 20 mg by mouth 2 (two) times daily.      Marland Kitchen HYDROcodone-homatropine (HYCODAN) 5-1.5 MG/5ML syrup Take 5 mLs by mouth every 6 (six) hours as needed for cough.    Marland Kitchen ibuprofen (ADVIL,MOTRIN) 800 MG tablet Take 800 mg by mouth every 8 (eight) hours as needed for moderate pain.    Marland Kitchen ipratropium-albuterol (DUONEB) 0.5-2.5 (3) MG/3ML SOLN Take 3 mLs by nebulization every 6 (six) hours as needed (SOB).    Marland Kitchen omeprazole (PRILOSEC) 20 MG capsule Take 1 capsule  by mouth Daily.    . simvastatin (ZOCOR) 40 MG tablet Take 40 mg by mouth at bedtime.     . tamsulosin (FLOMAX) 0.4 MG CAPS capsule Take 0.4 mg by mouth 2 (two) times daily.    . traMADol (ULTRAM) 50 MG tablet Take 1 tablet (50 mg total) by mouth every 8 (eight) hours as needed. 60 tablet 0   No facility-administered medications prior to visit.     Allergies:   Ivp dye and Morphine and related   Social History   Social History  . Marital Status: Married    Spouse Name: N/A  . Number of Children: 1  . Years of Education: N/A   Occupational History  . retired    Social History Main Topics  . Smoking status: Former Smoker -- 2.00 packs/day for 32 years    Types: Cigarettes    Quit date: 04/14/1992  . Smokeless tobacco:  Never Used  . Alcohol Use: No  . Drug Use: No  . Sexual Activity: Not Asked   Other Topics Concern  . None   Social History Narrative   Lives with wife   Retired [place] [employer] (79 yrs)   Museum/gallery conservator        Family History:  [name] family history includes Cancer in his brother and maternal grandmother; Diabetes in his mother; Hypertension in his mother; Melanoma in his brother; Prostatitis in his brother.   ROS:   Please see the history of present illness.    ROS All other systems reviewed and are negative.   PHYSICAL EXAM:   VS:  BP 136/92 mmHg  Pulse 56  Ht 5\' 9"  (1.753 m)  Wt 242 lb 12.8 oz (110.133 kg)  BMI 35.84 kg/m2   GEN: Well nourished, well developed, in no acute distress HEENT: normal Neck: no JVD, carotid bruits, or masses Cardiac: RRR; no murmurs, rubs, or gallops,no edema  Respiratory:  clear to auscultation bilaterally, normal work of breathing GI: soft, nontender, nondistended, + BS, obese MS: no deformity or atrophy Skin: warm and dry, no rash, wearing shoulder sling Neuro:  Alert and Oriented x 3, Strength and sensation are intact Psych: euthymic mood, full affect  Wt Readings from Last 3 Encounters:  05/16/15 242 lb 12.8 oz (110.133 kg)  03/30/15 242 lb 9.6 oz (110.043 kg)  07/04/14 222 lb (100.699 kg)      Studies/Labs Reviewed:   EKG:  EKG is  ordered today.  05/16/15-sinus bradycardia rate 57 with left bundle branch block/interventricular conduction delay. Personally viewed-prior 06/30/10 EKG demonstrates sinus rhythm with poor R-wave progression, intraventricular conduction delay, old anterior infarct pattern.  ECHO: 11/01/10 - Left ventricle: The cavity size was severely dilated. Systolic function was severely reduced. The estimated ejection fraction was in the range of 20% to 25%. Diffuse hypokinesis with regional variations. Hypokinesis. Doppler parameters are consistent with abnormal left ventricular relaxation (grade 1  diastolic dysfunction). - Mitral valve: Dilated annulus. - Left atrium: The atrium was moderately dilated. - Right atrium: The atrium was mildly dilated.  Recent Labs: No results found for requested labs within last 365 days.   Lipid Panel No results found for: CHOL, TRIG, HDL, CHOLHDL, VLDL, LDLCALC, LDLDIRECT  Additional studies/ records that were reviewed today include:  Prior echocardiogram, EKG, blood work, lab notes reviewed    ASSESSMENT:    1. Pre-operative cardiovascular examination   2. Left ventricular ejection fraction less than 40%   3. Cardiomyopathy, ischemic   4. Coronary artery disease due  to lipid rich plaque   5. Left shoulder pain      PLAN:  In order of problems listed above:  Preoperative risk stratification prior to left shoulder surgery -Continues to be asymptomatic, class I with his underlying cardiomyopathy, last assessed ejection fraction was 25% in 2012 consistent with prior evaluation. He is able to complete greater than 4 METS of activity without anginal symptoms. No high-risk symptoms such as syncope. -Based upon this assessment, he may proceed with shoulder surgery with moderate overall cardiac risk based upon his underlying ejection fraction/cardiomyopathy of 25%. No further cardiac testing warranted.  Cardiomyopathy -Was not interested previously in nuclear stress test or ICD. Understands risks of sudden death. -I will recheck an echocardiogram since it is been over 5 years.  Obesity -Encourage weight loss  Coronary artery disease -Post anterior wall MI several years ago. -1995, no PCI, total LAD/catheterization 2001, 30% LAD beyond origin of large diagonal, circumflex normal, RCA normal, anterolateral and apical akinesis with ejection fraction between the 25-35% range. -Continue aggressive secondary prevention. Doing well with beta blocker, ACE inhibitor. Statin. Aspirin.   Medication Adjustments/Labs and Tests Ordered: Current  medicines are reviewed at length with the patient today.  Concerns regarding medicines are outlined above.  Medication changes, Labs and Tests ordered today are listed in the Patient Instructions below. Patient Instructions  Medication Instructions:  The current medical regimen is effective;  continue present plan and medications.  Testing/Procedures: Your physician has requested that you have an echocardiogram. Echocardiography is a painless test that uses sound waves to create images of your heart. It provides your doctor with information about the size and shape of your heart and how well your heart's chambers and valves are working. This procedure takes approximately one hour. There are no restrictions for this procedure.  Follow-Up: Follow up in 1 year with Dr. Marlou Porch.  You will receive a letter in the mail 2 months before you are due.  Please call us when you receive this letter to schedule your follow up appointment.  If you need a refill on your cardiac medications before your next appointment, please call your pharmacy.  Thank you for choosing [facility]!!          Signed, Candee Furbish, MD  05/16/2015 [facility] [address], [place], [place]  [address] Phone: [phone]; Fax: [phone]

## 2015-05-23 ENCOUNTER — Encounter (HOSPITAL_COMMUNITY)
Admission: RE | Admit: 2015-05-23 | Discharge: 2015-05-23 | Disposition: A | Discharge status: Home / Self Care | Payer: Worker's Compensation | Source: Ambulatory Visit | Attending: Orthopedic Surgery | Admitting: Orthopedic Surgery

## 2015-05-23 ENCOUNTER — Encounter (HOSPITAL_COMMUNITY): Payer: Self-pay

## 2015-05-23 ENCOUNTER — Other Ambulatory Visit: Payer: Self-pay | Admitting: Surgical

## 2015-05-23 ENCOUNTER — Encounter (INDEPENDENT_AMBULATORY_CARE_PROVIDER_SITE_OTHER): Payer: Self-pay

## 2015-05-23 DIAGNOSIS — Z79899 Other long term (current) drug therapy: Secondary | ICD-10-CM | POA: Diagnosis not present

## 2015-05-23 DIAGNOSIS — K219 Gastro-esophageal reflux disease without esophagitis: Secondary | ICD-10-CM | POA: Diagnosis not present

## 2015-05-23 DIAGNOSIS — Z87891 Personal history of nicotine dependence: Secondary | ICD-10-CM | POA: Diagnosis not present

## 2015-05-23 DIAGNOSIS — I2510 Atherosclerotic heart disease of native coronary artery without angina pectoris: Secondary | ICD-10-CM | POA: Diagnosis not present

## 2015-05-23 DIAGNOSIS — M75102 Unspecified rotator cuff tear or rupture of left shoulder, not specified as traumatic: Secondary | ICD-10-CM | POA: Diagnosis present

## 2015-05-23 DIAGNOSIS — Z7982 Long term (current) use of aspirin: Secondary | ICD-10-CM | POA: Diagnosis not present

## 2015-05-23 DIAGNOSIS — E785 Hyperlipidemia, unspecified: Secondary | ICD-10-CM | POA: Diagnosis not present

## 2015-05-23 DIAGNOSIS — I252 Old myocardial infarction: Secondary | ICD-10-CM | POA: Diagnosis not present

## 2015-05-23 DIAGNOSIS — Z85828 Personal history of other malignant neoplasm of skin: Secondary | ICD-10-CM | POA: Diagnosis not present

## 2015-05-23 DIAGNOSIS — Z9049 Acquired absence of other specified parts of digestive tract: Secondary | ICD-10-CM | POA: Diagnosis not present

## 2015-05-23 HISTORY — DX: Pneumonia, unspecified organism: J18.9

## 2015-05-23 LAB — BASIC METABOLIC PANEL
ANION GAP: 9 (ref 5–15)
BUN: 17 mg/dL (ref 6–20)
CO2: 23 mmol/L (ref 22–32)
Calcium: 9.3 mg/dL (ref 8.9–10.3)
Chloride: 109 mmol/L (ref 101–111)
Creatinine, Ser: 0.82 mg/dL (ref 0.61–1.24)
GFR calc Af Amer: >60 mL/min (ref >60)
Glucose, Bld: 95 mg/dL (ref 65–99)
POTASSIUM: 4.1 mmol/L (ref 3.5–5.1)
SODIUM: 141 mmol/L (ref 135–145)

## 2015-05-23 LAB — CBC
HCT: 46.8 % (ref 39.0–52.0)
Hemoglobin: 15.7 g/dL (ref 13.0–17.0)
MCH: 30.2 pg (ref 26.0–34.0)
MCHC: 33.5 g/dL (ref 30.0–36.0)
MCV: 90 fL (ref 78.0–100.0)
PLATELETS: 191 10*3/uL (ref 150–400)
RBC: 5.2 MIL/uL (ref 4.22–5.81)
RDW: 13.6 % (ref 11.5–15.5)
WBC: 9.2 10*3/uL (ref 4.0–10.5)

## 2015-05-23 NOTE — Patient Instructions (Signed)
Tony Patrick  05/23/2015   Your procedure is scheduled on: 05/26/15  Report to [facility] Main  Entrance take [facility]  elevators to 3rd floor to  [place] at 5:30 AM.  Call this number if you have problems the morning of surgery [phone]   Remember: ONLY 1 PERSON MAY GO WITH YOU TO SHORT STAY TO GET  READY MORNING OF [place].  Do not eat food or drink liquids :After Midnight.Thursday     Take these medicines the morning of surgery with A SIP OF WATER: Coreg, Claritin, Flomax, Omeprazole DO NOT TAKE ANY DIABETIC MEDICATIONS DAY OF YOUR SURGERY                               You may not have any metal on your body .               Do not wear jewelry, make-up, lotions, powders or , deodorant              Do not shave  48 hours prior to surgery.              Men may shave face and neck.   Do not bring valuables to the hospital. [place].  Contacts, dentures or bridgework may not be worn into surgery.  Leave suitcase in the car. After surgery it may be brought to your room.     Patients discharged the day of surgery will not be allowed to drive home.  Name and phone number of your driver: Christoff Shimko  Special Instructions: N/A              _____________________________________________________________________             [facility] - Preparing for Surgery Before surgery, you can play an important role.  Because skin is not sterile, your skin needs to be as free of germs as possible.  You can reduce the number of germs on your skin by washing with CHG (chlorahexidine gluconate) soap before surgery.  CHG is an antiseptic cleaner which kills germs and bonds with the skin to continue killing germs even after washing. Please DO NOT use if you have an allergy to CHG or antibacterial soaps.  If your skin becomes reddened/irritated stop using the CHG and inform your nurse when you arrive at Short  Stay. Do not shave (including legs and underarms) for at least 48 hours prior to the first CHG shower.  You may shave your face/neck. Please follow these instructions carefully:  1.  Shower with CHG Soap the night before surgery and the  morning of Surgery.  2.  If you choose to wash your hair, wash your hair first as usual with your  normal  shampoo.  3.  After you shampoo, rinse your hair and body thoroughly to remove the  shampoo.                           4.  Use CHG as you would any other liquid soap.  You can apply chg directly  to the skin and wash  Gently with a scrungie or clean washcloth.  5.  Apply the CHG Soap to your body ONLY FROM THE NECK DOWN.   Do not use on face/ open                           Wound or open sores. Avoid contact with eyes, ears mouth and genitals (private parts).                       Wash face,  Genitals (private parts) with your normal soap.             6.  Wash thoroughly, paying special attention to the area where your surgery  will be performed.  7.  Thoroughly rinse your body with warm water from the neck down.  8.  DO NOT shower/wash with your normal soap after using and rinsing off  the CHG Soap.                9.  Pat yourself dry with a clean towel.            10.  Wear clean pajamas.            11.  Place clean sheets on your bed the night of your first shower and do not  sleep with pets. Day of Surgery : Do not apply any lotions/deodorants the morning of surgery.  Please wear clean clothes to the hospital/surgery center.  FAILURE TO FOLLOW THESE INSTRUCTIONS MAY RESULT IN THE CANCELLATION OF YOUR SURGERY PATIENT SIGNATURE_________________________________  NURSE SIGNATURE__________________________________  ________________________________________________________________________

## 2015-05-23 NOTE — Pre-Procedure Instructions (Addendum)
Dr. Delma Post made aware of cardiac clearance given by Dr. Marlou Porch.  Cardiac notes are on chart and in EPIC. Patient is scheduled for echo per Dr. Marlou Porch on 05/25/15- Dr. Margurite Auerbach aware.

## 2015-05-25 ENCOUNTER — Other Ambulatory Visit: Payer: Self-pay

## 2015-05-25 ENCOUNTER — Ambulatory Visit (HOSPITAL_COMMUNITY): Payer: Medicare Other | Attending: Internal Medicine

## 2015-05-25 DIAGNOSIS — R29898 Other symptoms and signs involving the musculoskeletal system: Secondary | ICD-10-CM | POA: Diagnosis not present

## 2015-05-25 DIAGNOSIS — R0989 Other specified symptoms and signs involving the circulatory and respiratory systems: Secondary | ICD-10-CM | POA: Diagnosis not present

## 2015-05-25 DIAGNOSIS — I517 Cardiomegaly: Secondary | ICD-10-CM | POA: Insufficient documentation

## 2015-05-25 DIAGNOSIS — E785 Hyperlipidemia, unspecified: Secondary | ICD-10-CM | POA: Diagnosis not present

## 2015-05-25 DIAGNOSIS — Z87891 Personal history of nicotine dependence: Secondary | ICD-10-CM | POA: Insufficient documentation

## 2015-05-25 DIAGNOSIS — R931 Abnormal findings on diagnostic imaging of heart and coronary circulation: Secondary | ICD-10-CM

## 2015-05-25 DIAGNOSIS — I2510 Atherosclerotic heart disease of native coronary artery without angina pectoris: Secondary | ICD-10-CM

## 2015-05-26 ENCOUNTER — Ambulatory Visit (HOSPITAL_COMMUNITY): Payer: Worker's Compensation | Admitting: Anesthesiology

## 2015-05-26 ENCOUNTER — Observation Stay (HOSPITAL_COMMUNITY)
Admission: RE | Admit: 2015-05-26 | Discharge: 2015-05-27 | Disposition: A | Discharge status: Home / Self Care | Payer: Worker's Compensation | Source: Ambulatory Visit | Attending: Orthopedic Surgery | Admitting: Orthopedic Surgery

## 2015-05-26 ENCOUNTER — Encounter (HOSPITAL_COMMUNITY)
Admission: RE | Disposition: A | Discharge status: Home / Self Care | Payer: Self-pay | Source: Ambulatory Visit | Attending: Orthopedic Surgery

## 2015-05-26 ENCOUNTER — Encounter (HOSPITAL_COMMUNITY): Payer: Self-pay | Admitting: *Deleted

## 2015-05-26 DIAGNOSIS — I252 Old myocardial infarction: Secondary | ICD-10-CM | POA: Insufficient documentation

## 2015-05-26 DIAGNOSIS — Z79899 Other long term (current) drug therapy: Secondary | ICD-10-CM | POA: Insufficient documentation

## 2015-05-26 DIAGNOSIS — Z7982 Long term (current) use of aspirin: Secondary | ICD-10-CM | POA: Insufficient documentation

## 2015-05-26 DIAGNOSIS — E785 Hyperlipidemia, unspecified: Secondary | ICD-10-CM | POA: Insufficient documentation

## 2015-05-26 DIAGNOSIS — K219 Gastro-esophageal reflux disease without esophagitis: Secondary | ICD-10-CM | POA: Insufficient documentation

## 2015-05-26 DIAGNOSIS — Z87891 Personal history of nicotine dependence: Secondary | ICD-10-CM | POA: Insufficient documentation

## 2015-05-26 DIAGNOSIS — G8918 Other acute postprocedural pain: Secondary | ICD-10-CM | POA: Diagnosis not present

## 2015-05-26 DIAGNOSIS — M75102 Unspecified rotator cuff tear or rupture of left shoulder, not specified as traumatic: Secondary | ICD-10-CM | POA: Diagnosis not present

## 2015-05-26 DIAGNOSIS — Z85828 Personal history of other malignant neoplasm of skin: Secondary | ICD-10-CM | POA: Insufficient documentation

## 2015-05-26 DIAGNOSIS — I2510 Atherosclerotic heart disease of native coronary artery without angina pectoris: Secondary | ICD-10-CM | POA: Diagnosis not present

## 2015-05-26 DIAGNOSIS — M25512 Pain in left shoulder: Secondary | ICD-10-CM | POA: Diagnosis not present

## 2015-05-26 DIAGNOSIS — M75100 Unspecified rotator cuff tear or rupture of unspecified shoulder, not specified as traumatic: Secondary | ICD-10-CM | POA: Diagnosis present

## 2015-05-26 DIAGNOSIS — Z9049 Acquired absence of other specified parts of digestive tract: Secondary | ICD-10-CM | POA: Insufficient documentation

## 2015-05-26 HISTORY — PX: SHOULDER OPEN ROTATOR CUFF REPAIR: SHX2407

## 2015-05-26 SURGERY — REPAIR, ROTATOR CUFF, OPEN
Anesthesia: General | Site: Shoulder | Laterality: Left

## 2015-05-26 MED ORDER — GELATIN ABSORBABLE 12-7 MM EX MISC
CUTANEOUS | Status: DC | PRN
  Administered 2015-05-26: 1

## 2015-05-26 MED ORDER — PHENYLEPHRINE HCL 10 MG/ML IJ SOLN
10.0000 mg | INTRAVENOUS | Status: DC | PRN
  Administered 2015-05-26: 50 ug/min via INTRAVENOUS

## 2015-05-26 MED ORDER — MIDAZOLAM HCL 2 MG/2ML IJ SOLN
INTRAMUSCULAR | Status: AC
  Filled 2015-05-26: qty 2

## 2015-05-26 MED ORDER — SUGAMMADEX SODIUM 200 MG/2ML IV SOLN
INTRAVENOUS | Status: AC
  Filled 2015-05-26: qty 2

## 2015-05-26 MED ORDER — ACETAMINOPHEN 325 MG PO TABS: 650.0000 mg | ORAL_TABLET | Freq: Four times a day (QID) | ORAL | Status: DC | PRN

## 2015-05-26 MED ORDER — ONDANSETRON HCL 4 MG/2ML IJ SOLN: 4.0000 mg | Freq: Once | INTRAMUSCULAR | Status: DC | PRN

## 2015-05-26 MED ORDER — SUCCINYLCHOLINE CHLORIDE 20 MG/ML IJ SOLN
INTRAMUSCULAR | Status: DC | PRN
  Administered 2015-05-26: 100 mg via INTRAVENOUS

## 2015-05-26 MED ORDER — MIDAZOLAM HCL 5 MG/5ML IJ SOLN
INTRAMUSCULAR | Status: DC | PRN
  Administered 2015-05-26: 2 mg via INTRAVENOUS

## 2015-05-26 MED ORDER — FENTANYL CITRATE (PF) 100 MCG/2ML IJ SOLN: 25.0000 ug | INTRAMUSCULAR | Status: DC | PRN

## 2015-05-26 MED ORDER — FENTANYL CITRATE (PF) 250 MCG/5ML IJ SOLN
INTRAMUSCULAR | Status: AC
  Filled 2015-05-26: qty 5

## 2015-05-26 MED ORDER — SODIUM CHLORIDE 0.9 % IR SOLN
Status: DC | PRN
  Administered 2015-05-26: 500 mL

## 2015-05-26 MED ORDER — SIMVASTATIN 20 MG PO TABS
40.0000 mg | ORAL_TABLET | Freq: Every day | ORAL | Status: DC
  Administered 2015-05-26: 40 mg via ORAL
  Filled 2015-05-26: qty 2

## 2015-05-26 MED ORDER — ONDANSETRON HCL 4 MG/2ML IJ SOLN
INTRAMUSCULAR | Status: AC
  Filled 2015-05-26: qty 2

## 2015-05-26 MED ORDER — CEFAZOLIN SODIUM 1-5 GM-% IV SOLN
1.0000 g | Freq: Four times a day (QID) | INTRAVENOUS | Status: AC
  Administered 2015-05-26 – 2015-05-27 (×3): 1 g via INTRAVENOUS
  Filled 2015-05-26 (×4): qty 50

## 2015-05-26 MED ORDER — ROCURONIUM BROMIDE 100 MG/10ML IV SOLN
INTRAVENOUS | Status: DC | PRN
  Administered 2015-05-26: 50 mg via INTRAVENOUS

## 2015-05-26 MED ORDER — BUPIVACAINE LIPOSOME 1.3 % IJ SUSP
20.0000 mL | Freq: Once | INTRAMUSCULAR | Status: DC
  Filled 2015-05-26: qty 20

## 2015-05-26 MED ORDER — SUGAMMADEX SODIUM 200 MG/2ML IV SOLN
INTRAVENOUS | Status: DC | PRN
  Administered 2015-05-26: 200 mg via INTRAVENOUS

## 2015-05-26 MED ORDER — ALBUTEROL SULFATE (2.5 MG/3ML) 0.083% IN NEBU: 3.0000 mL | INHALATION_SOLUTION | Freq: Four times a day (QID) | RESPIRATORY_TRACT | Status: DC | PRN

## 2015-05-26 MED ORDER — CHLORHEXIDINE GLUCONATE 4 % EX LIQD: 60.0000 mL | Freq: Once | CUTANEOUS | Status: DC

## 2015-05-26 MED ORDER — OXYCODONE-ACETAMINOPHEN 5-325 MG PO TABS
2.0000 | ORAL_TABLET | ORAL | Status: DC | PRN
  Administered 2015-05-27: 2 via ORAL
  Filled 2015-05-26: qty 2

## 2015-05-26 MED ORDER — ONDANSETRON HCL 4 MG/2ML IJ SOLN
INTRAMUSCULAR | Status: DC | PRN
  Administered 2015-05-26: 4 mg via INTRAVENOUS

## 2015-05-26 MED ORDER — LIDOCAINE HCL (CARDIAC) 20 MG/ML IV SOLN
INTRAVENOUS | Status: DC | PRN
  Administered 2015-05-26: 50 mg via INTRAVENOUS

## 2015-05-26 MED ORDER — HYDROMORPHONE HCL 1 MG/ML IJ SOLN: 0.5000 mg | INTRAMUSCULAR | Status: DC | PRN

## 2015-05-26 MED ORDER — TAMSULOSIN HCL 0.4 MG PO CAPS
0.4000 mg | ORAL_CAPSULE | Freq: Two times a day (BID) | ORAL | Status: DC
  Administered 2015-05-26 – 2015-05-27 (×2): 0.4 mg via ORAL
  Filled 2015-05-26 (×2): qty 1

## 2015-05-26 MED ORDER — 0.9 % SODIUM CHLORIDE (POUR BTL) OPTIME
TOPICAL | Status: DC | PRN
  Administered 2015-05-26: 1000 mL

## 2015-05-26 MED ORDER — ONDANSETRON HCL 4 MG/2ML IJ SOLN: 4.0000 mg | Freq: Four times a day (QID) | INTRAMUSCULAR | Status: DC | PRN

## 2015-05-26 MED ORDER — PROPOFOL 10 MG/ML IV BOLUS
INTRAVENOUS | Status: AC
  Filled 2015-05-26: qty 40

## 2015-05-26 MED ORDER — PHENOL 1.4 % MT LIQD
1.0000 | OROMUCOSAL | Status: DC | PRN
  Filled 2015-05-26: qty 177

## 2015-05-26 MED ORDER — OXYCODONE-ACETAMINOPHEN 5-325 MG PO TABS: 2.0000 | ORAL_TABLET | ORAL | Status: DC | PRN

## 2015-05-26 MED ORDER — IPRATROPIUM-ALBUTEROL 0.5-2.5 (3) MG/3ML IN SOLN: 3.0000 mL | Freq: Four times a day (QID) | RESPIRATORY_TRACT | Status: DC | PRN

## 2015-05-26 MED ORDER — CARVEDILOL 25 MG PO TABS
25.0000 mg | ORAL_TABLET | Freq: Two times a day (BID) | ORAL | Status: DC
  Administered 2015-05-26 – 2015-05-27 (×2): 25 mg via ORAL
  Filled 2015-05-26 (×2): qty 1

## 2015-05-26 MED ORDER — DEXAMETHASONE SODIUM PHOSPHATE 10 MG/ML IJ SOLN
INTRAMUSCULAR | Status: AC
  Filled 2015-05-26: qty 1

## 2015-05-26 MED ORDER — METOCLOPRAMIDE HCL 5 MG/ML IJ SOLN: 5.0000 mg | Freq: Three times a day (TID) | INTRAMUSCULAR | Status: DC | PRN

## 2015-05-26 MED ORDER — SODIUM CHLORIDE 0.9 % IR SOLN
Status: AC
  Filled 2015-05-26: qty 1

## 2015-05-26 MED ORDER — ROCURONIUM BROMIDE 50 MG/5ML IV SOLN
INTRAVENOUS | Status: AC
  Filled 2015-05-26: qty 1

## 2015-05-26 MED ORDER — ENALAPRIL MALEATE 20 MG PO TABS
20.0000 mg | ORAL_TABLET | Freq: Two times a day (BID) | ORAL | Status: DC
  Administered 2015-05-26 – 2015-05-27 (×2): 20 mg via ORAL
  Filled 2015-05-26 (×4): qty 1

## 2015-05-26 MED ORDER — ASPIRIN EC 325 MG PO TBEC
325.0000 mg | DELAYED_RELEASE_TABLET | Freq: Every day | ORAL | Status: DC
  Administered 2015-05-27: 325 mg via ORAL
  Filled 2015-05-26: qty 1

## 2015-05-26 MED ORDER — PHENYLEPHRINE HCL 10 MG/ML IJ SOLN
INTRAMUSCULAR | Status: AC
  Filled 2015-05-26: qty 1

## 2015-05-26 MED ORDER — METOCLOPRAMIDE HCL 5 MG PO TABS: 5.0000 mg | ORAL_TABLET | Freq: Three times a day (TID) | ORAL | Status: DC | PRN

## 2015-05-26 MED ORDER — DEXAMETHASONE SODIUM PHOSPHATE 10 MG/ML IJ SOLN
INTRAMUSCULAR | Status: DC | PRN
  Administered 2015-05-26: 10 mg via INTRAVENOUS

## 2015-05-26 MED ORDER — LACTATED RINGERS IV SOLN
INTRAVENOUS | Status: DC
  Administered 2015-05-26: 100 mL/h via INTRAVENOUS

## 2015-05-26 MED ORDER — FENTANYL CITRATE (PF) 100 MCG/2ML IJ SOLN
INTRAMUSCULAR | Status: DC | PRN
  Administered 2015-05-26 (×3): 50 ug via INTRAVENOUS

## 2015-05-26 MED ORDER — ONDANSETRON HCL 4 MG PO TABS: 4.0000 mg | ORAL_TABLET | Freq: Four times a day (QID) | ORAL | Status: DC | PRN

## 2015-05-26 MED ORDER — LIDOCAINE HCL (CARDIAC) 20 MG/ML IV SOLN
INTRAVENOUS | Status: AC
  Filled 2015-05-26: qty 5

## 2015-05-26 MED ORDER — HYDROCODONE-ACETAMINOPHEN 5-325 MG PO TABS: 1.0000 | ORAL_TABLET | ORAL | Status: DC | PRN

## 2015-05-26 MED ORDER — PROPOFOL 10 MG/ML IV BOLUS
INTRAVENOUS | Status: DC | PRN
  Administered 2015-05-26: 120 mg via INTRAVENOUS

## 2015-05-26 MED ORDER — BUPIVACAINE-EPINEPHRINE 0.5% -1:200000 IJ SOLN
INTRAMUSCULAR | Status: AC
  Filled 2015-05-26: qty 1

## 2015-05-26 MED ORDER — MENTHOL 3 MG MT LOZG: 1.0000 | LOZENGE | OROMUCOSAL | Status: DC | PRN

## 2015-05-26 MED ORDER — LACTATED RINGERS IV SOLN
INTRAVENOUS | Status: DC
  Administered 2015-05-26 (×2): via INTRAVENOUS

## 2015-05-26 MED ORDER — THROMBIN 5000 UNITS EX SOLR
CUTANEOUS | Status: AC
  Filled 2015-05-26: qty 5000

## 2015-05-26 MED ORDER — ACETAMINOPHEN 650 MG RE SUPP: 650.0000 mg | Freq: Four times a day (QID) | RECTAL | Status: DC | PRN

## 2015-05-26 MED ORDER — CEFAZOLIN SODIUM-DEXTROSE 2-4 GM/100ML-% IV SOLN
INTRAVENOUS | Status: AC
  Filled 2015-05-26: qty 100

## 2015-05-26 MED ORDER — PANTOPRAZOLE SODIUM 40 MG PO TBEC
40.0000 mg | DELAYED_RELEASE_TABLET | Freq: Every day | ORAL | Status: DC
  Administered 2015-05-27: 40 mg via ORAL
  Filled 2015-05-26: qty 1

## 2015-05-26 MED ORDER — CEFAZOLIN SODIUM-DEXTROSE 2-4 GM/100ML-% IV SOLN
2.0000 g | INTRAVENOUS | Status: AC
  Administered 2015-05-26: 2 g via INTRAVENOUS
  Filled 2015-05-26: qty 100

## 2015-05-26 MED ORDER — EPHEDRINE SULFATE 50 MG/ML IJ SOLN
INTRAMUSCULAR | Status: DC | PRN
  Administered 2015-05-26 (×2): 5 mg via INTRAVENOUS

## 2015-05-26 SURGICAL SUPPLY — 53 items
AGENT HMST SPONGE THK3/8 (HEMOSTASIS) ×1
ANCH SUT 2 5.5 BABSR ASCP (Orthopedic Implant) ×1 IMPLANT
ANCHOR PEEK ZIP 5.5 NDL NO2 (Orthopedic Implant) ×2 IMPLANT
ATTRACTOMAT 16X20 MAGNETIC DRP (DRAPES) ×3 IMPLANT
BAG SPEC THK2 15X12 ZIP CLS (MISCELLANEOUS) ×1
BAG ZIPLOCK 12X15 (MISCELLANEOUS) ×2 IMPLANT
BLADE OSCILLATING/SAGITTAL (BLADE) ×3
BLADE SW THK.38XMED LNG THN (BLADE) ×1 IMPLANT
BNDG COHESIVE 6X5 TAN STRL LF (GAUZE/BANDAGES/DRESSINGS) ×3 IMPLANT
BUR OVAL CARBIDE 4.0 (BURR) ×3 IMPLANT
DERMASPAN .5-.9MM 4X4CM SHOU (Miscellaneous) ×2 IMPLANT
DRAPE POUCH INSTRU U-SHP 10X18 (DRAPES) ×3 IMPLANT
DRAPE SHEET LG 3/4 BI-LAMINATE (DRAPES) ×2 IMPLANT
DRESSING AQUACEL AG SP 3.5X6 (GAUZE/BANDAGES/DRESSINGS) IMPLANT
DRSG AQUACEL AG SP 3.5X6 (GAUZE/BANDAGES/DRESSINGS) ×3
DURAPREP 26ML APPLICATOR (WOUND CARE) ×3 IMPLANT
ELECT BLADE TIP CTD 4 INCH (ELECTRODE) ×2 IMPLANT
ELECT REM PT RETURN 9FT ADLT (ELECTROSURGICAL) ×3
ELECTRODE REM PT RTRN 9FT ADLT (ELECTROSURGICAL) ×1 IMPLANT
GLOVE BIOGEL PI IND STRL 6.5 (GLOVE) ×1 IMPLANT
GLOVE BIOGEL PI IND STRL 7.0 (GLOVE) IMPLANT
GLOVE BIOGEL PI IND STRL 7.5 (GLOVE) IMPLANT
GLOVE BIOGEL PI IND STRL 8 (GLOVE) ×1 IMPLANT
GLOVE BIOGEL PI INDICATOR 6.5 (GLOVE) ×2
GLOVE BIOGEL PI INDICATOR 7.0 (GLOVE) ×2
GLOVE BIOGEL PI INDICATOR 7.5 (GLOVE) ×2
GLOVE BIOGEL PI INDICATOR 8 (GLOVE) ×2
GLOVE ECLIPSE 8.0 STRL XLNG CF (GLOVE) ×3 IMPLANT
GLOVE SURG SS PI 6.5 STRL IVOR (GLOVE) ×3 IMPLANT
GLOVE SURG SS PI 7.0 STRL IVOR (GLOVE) ×2 IMPLANT
GLOVE SURG SS PI 7.5 STRL IVOR (GLOVE) ×2 IMPLANT
GOWN STRL REUS W/TWL LRG LVL3 (GOWN DISPOSABLE) ×3 IMPLANT
GOWN STRL REUS W/TWL XL LVL3 (GOWN DISPOSABLE) ×7 IMPLANT
HEMOSTAT SPONGE AVITENE ULTRA (HEMOSTASIS) ×2 IMPLANT
KIT BASIN OR (CUSTOM PROCEDURE TRAY) ×3 IMPLANT
KIT POSITION SHOULDER SCHLEI (MISCELLANEOUS) ×3 IMPLANT
LIQUID BAND (GAUZE/BANDAGES/DRESSINGS) ×3 IMPLANT
MANIFOLD NEPTUNE II (INSTRUMENTS) ×3 IMPLANT
PACK SHOULDER (CUSTOM PROCEDURE TRAY) ×3 IMPLANT
PAD ABD 8X10 STRL (GAUZE/BANDAGES/DRESSINGS) ×2 IMPLANT
POSITIONER SURGICAL ARM (MISCELLANEOUS) ×3 IMPLANT
SLING ARM FOAM STRAP LRG (SOFTGOODS) IMPLANT
SLING ARM XL FOAM STRAP (SOFTGOODS) ×2 IMPLANT
SUCTION FRAZIER HANDLE 12FR (TUBING) ×2
SUCTION TUBE FRAZIER 12FR DISP (TUBING) ×1 IMPLANT
SUT BONE WAX W31G (SUTURE) ×3 IMPLANT
SUT ETHIBOND NAB CT1 #1 30IN (SUTURE) ×5 IMPLANT
SUT MNCRL AB 4-0 PS2 18 (SUTURE) ×3 IMPLANT
SUT VIC AB 1 CT1 27 (SUTURE) ×6
SUT VIC AB 1 CT1 27XBRD ANTBC (SUTURE) ×2 IMPLANT
SUT VIC AB 2-0 CT1 27 (SUTURE) ×6
SUT VIC AB 2-0 CT1 TAPERPNT 27 (SUTURE) ×2 IMPLANT
TOWEL OR 17X26 10 PK STRL BLUE (TOWEL DISPOSABLE) ×3 IMPLANT

## 2015-05-26 NOTE — Discharge Instructions (Signed)
Keep your sling on at all times, including sleeping in your sling. The only time you should remove your sling is to shower only but you need to keep your hand against your chest while you shower.  Do not remove the dressing. It is waterproof and will stay on until your first visit in the office. Call Dr. Gladstone Lighter if any wound complications or temperature of 101 degrees F or over.  Call the office for an appointment to see Dr. Gladstone Lighter in two weeks: [phone] and ask for Dr. Charlestine Night nurse, Brunilda Payor.

## 2015-05-26 NOTE — Care Management Note (Signed)
Case Management Note  Patient Details  Name: Tony Patrick MRN: [id number] Date of Birth: 1950/03/07  Subjective/Objective:                    Action/Plan:d/c home no needs or orders.   Expected Discharge Date:                  Expected Discharge Plan:  Home/Self Care  In-House Referral:     Discharge planning Services  CM Consult  Post Acute Care Choice:    Choice offered to:     DME Arranged:    DME Agency:     HH Arranged:    [place] Agency:     Status of Service:  Completed, signed off  Medicare Important Message Given:    Date Medicare IM Given:    Medicare IM give by:    Date Additional Medicare IM Given:    Additional Medicare Important Message give by:     If discussed at [place] of Stay Meetings, dates discussed:    Additional Comments:  Dessa Phi, RN 05/26/2015, 1:20 PM

## 2015-05-26 NOTE — H&P (Signed)
Tony Patrick is an 65 y.o. male.   Chief Complaint: Painand limited motion of Left Shoulder. HPI: Injured his Left Shoulder.  Past Medical History  Diagnosis Date  . CAD (coronary artery disease)     Anterior MI, [facility], 1995, no PCI, total LAD  / catheterization 2001, 30% LAD beyond the origin of a large diagonal, circumflex normal, RCA normal, anterolateral and apical  akinesis, ejection fraction 25-35% range  . Kidney stones 1990's  . GERD (gastroesophageal reflux disease)   . S/P colectomy     Benign tumor  . Dyslipidemia   . Ejection fraction < 50%     EF 25-35%, catheterization 2001 / EF 20-25%, global hypokinesis, echo in June, 2012  . Hypoxia     Pneumonia, hospitalization, June, 2012  . Basal cell carcinoma   . MI (myocardial infarction) ([place]) 1995  . Pneumonia 2012    Past Surgical History  Procedure Laterality Date  . Colectomy  1981    benign mass  . Leg surgery Right     cyst  . Pleural scarification  1978  . Chest tube insertion    . Colonoscopy with propofol N/A 07/04/2014    Procedure: COLONOSCOPY WITH PROPOFOL;  Surgeon: Gatha Mayer, MD;  Location: [facility];  Service: Endoscopy;  Laterality: N/A;    Family History  Problem Relation Age of Onset  . Diabetes Mother   . Hypertension Mother   . Cancer Maternal Grandmother     type unknown  . Melanoma Brother   . Cancer Brother     type unknown  . Prostatitis Brother    Social History:  reports that he quit smoking about 23 years ago. His smoking use included Cigarettes. He has a 64 pack-year smoking history. He has never used smokeless tobacco. He reports that he does not drink alcohol or use illicit drugs.  Allergies:  Allergies  Allergen Reactions  . Ivp Dye [Iodinated Diagnostic Agents] Swelling and Other (See Comments)    Arrest  . Morphine And Related Nausea And Vomiting    Medications Prior to Admission  Medication Sig Dispense Refill  . aspirin EC 81 MG tablet Take 81 mg by mouth  daily.    . carvedilol (COREG) 25 MG tablet Take 1 tablet (25 mg total) by mouth 2 (two) times daily. 60 tablet 6  . enalapril (VASOTEC) 20 MG tablet Take 20 mg by mouth 2 (two) times daily.      Marland Kitchen ibuprofen (ADVIL,MOTRIN) 800 MG tablet Take 800 mg by mouth every 8 (eight) hours as needed for moderate pain.    Marland Kitchen loratadine (CLARITIN) 10 MG tablet Take 10 mg by mouth daily.    Marland Kitchen omeprazole (PRILOSEC) 20 MG capsule Take 20 mg by mouth Daily.     . simvastatin (ZOCOR) 40 MG tablet Take 40 mg by mouth at bedtime.     . tamsulosin (FLOMAX) 0.4 MG CAPS capsule Take 0.4 mg by mouth 2 (two) times daily.    . traMADol (ULTRAM) 50 MG tablet Take 1 tablet (50 mg total) by mouth every 8 (eight) hours as needed. (Patient taking differently: Take 50 mg by mouth every 8 (eight) hours as needed (For pain.). ) 60 tablet 0  . albuterol (PROVENTIL HFA;VENTOLIN HFA) 108 (90 BASE) MCG/ACT inhaler Inhale 2 puffs into the lungs every 6 (six) hours as needed for wheezing or shortness of breath.    . diclofenac sodium (VOLTAREN) 1 % GEL Apply 1 application topically 3 (three) times daily as needed (  for hands).    Marland Kitchen HYDROcodone-homatropine (HYCODAN) 5-1.5 MG/5ML syrup Take 5 mLs by mouth every 6 (six) hours as needed for cough.    Marland Kitchen ipratropium-albuterol (DUONEB) 0.5-2.5 (3) MG/3ML SOLN Take 3 mLs by nebulization every 6 (six) hours as needed (For shortness of breath.).       No results found for this or any previous visit (from the past 48 hour(s)). No results found.  Review of Systems  Constitutional: Negative.   HENT: Negative.   Eyes: Negative.   Respiratory: Negative.   Cardiovascular: Negative.   Gastrointestinal: Negative.   Genitourinary: Negative.   Musculoskeletal: Positive for joint pain.  Skin: Negative.   Neurological: Negative.   Endo/Heme/Allergies: Negative.   Psychiatric/Behavioral: Negative.     Blood pressure 141/88, pulse 78, temperature 97.9 F (36.6 C), temperature source Oral, resp.  rate 16, height 5\' 9"  (1.753 m), weight 109.77 kg (242 lb), SpO2 97 %. Physical Exam  Constitutional: He appears well-developed.  HENT:  Head: Normocephalic.  Eyes: Pupils are equal, round, and reactive to light.  Neck: Normal range of motion.  Cardiovascular: Normal rate.   GI: Soft.  Genitourinary: Penis normal.  Musculoskeletal:  Painful Range of motion of Left Shoulder.  Neurological: He is alert.  Skin: Skin is warm.  Psychiatric: He has a normal mood and affect.     Assessment/Plan Open repair of Left Rotator Cuff.  Tobi Bastos, MD 05/26/2015, 7:10 AM

## 2015-05-26 NOTE — Brief Op Note (Signed)
05/26/2015  8:38 AM  PATIENT:  Tony Patrick  65 y.o. male  PRE-OPERATIVE DIAGNOSIS: Complex,severe, Retracted Tear of Left Rotator Cuff.  POST-OPERATIVE DIAGNOSIS: Same as Pre-Op  PROCEDURE:  Procedure(s) with comments: LEFT OPEN ACROMINECTOMY AND REPAIR WITH GRAFT AND ANCHOR ROTATOR CUFF TEAR  (Left) - Left shoulder block in holding.SEVERE Retracted Tear.  SURGEON:  Surgeon(s) and Role:    * Latanya Maudlin, MD - Primary  PHYSICIAN ASSISTANT: Ardeen Jourdain PA  ASSISTANTS: Ardeen Jourdain PA   ANESTHESIA:   general  EBL:  Total I/O In: 1000 [I.V.:1000] Out: 50 [Blood:50]  BLOOD ADMINISTERED:none  DRAINS: none   LOCAL MEDICATIONS USED:  NONE and OTHER Interscalene Nerve Block by Anesthesia Pre-Op  SPECIMEN:  No Specimen  DISPOSITION OF SPECIMEN:  N/A  COUNTS:  YES  TOURNIQUET:  * No tourniquets in log *  DICTATION: .Other Dictation: Dictation Number [id number]  PLAN OF CARE: Admit for overnight observation  PATIENT DISPOSITION:  PACU - hemodynamically stable.   Delay start of Pharmacological VTE agent (>24hrs) due to surgical blood loss or risk of bleeding: yes

## 2015-05-26 NOTE — Interval H&P Note (Signed)
History and Physical Interval Note:  05/26/2015 7:13 AM  Tony Patrick  has presented today for surgery, with the diagnosis of TORN LEFT ROTATOR CUFF   The various methods of treatment have been discussed with the patient and family. After consideration of risks, benefits and other options for treatment, the patient has consented to  Procedure(s): LEFT OPEN ACROMINECTOMY AND REPAIR WITH GRAFT AND ANCHOR ROTATOR CUFF TEAR  (Left) as a surgical intervention .  [name] history has been reviewed, patient examined, no change in status, stable for surgery.  I have reviewed the patient's chart and labs.  Questions were answered to the patient's satisfaction.     Jefferey Lippmann A

## 2015-05-26 NOTE — Anesthesia Preprocedure Evaluation (Signed)
Anesthesia Evaluation  Patient identified by MRN, date of birth, ID band Patient awake    Reviewed: Allergy & Precautions, NPO status , Patient's Chart, lab work & pertinent test results  Airway Mallampati: II  TM Distance: >3 FB Neck ROM: Full    Dental  (+) Teeth Intact, Dental Advisory Given   Pulmonary former smoker,    breath sounds clear to auscultation       Cardiovascular  Rhythm:Regular Rate:Normal     Neuro/Psych    GI/Hepatic   Endo/Other    Renal/GU      Musculoskeletal   Abdominal   Peds  Hematology   Anesthesia Other Findings   Reproductive/Obstetrics                             Anesthesia Physical Anesthesia Plan  ASA: III  Anesthesia Plan: General and Regional   Post-op Pain Management:    Induction: Intravenous  Airway Management Planned: Oral ETT  Additional Equipment:   Intra-op Plan:   Post-operative Plan: Extubation in OR  Informed Consent: I have reviewed the patients History and Physical, chart, labs and discussed the procedure including the risks, benefits and alternatives for the proposed anesthesia with the patient or authorized representative who has indicated his/her understanding and acceptance.   Dental advisory given  Plan Discussed with: CRNA and Anesthesiologist  Anesthesia Plan Comments:         Anesthesia Quick Evaluation

## 2015-05-26 NOTE — Anesthesia Procedure Notes (Addendum)
Anesthesia Regional Block:  Interscalene brachial plexus block  Pre-Anesthetic Checklist: ,, timeout performed, Correct Patient, Correct Site, Correct Laterality, Correct Procedure, Correct Position, site marked, Risks and benefits discussed,  Surgical consent,  Pre-op evaluation,  At surgeon's request and post-op pain management  Laterality: Left  Prep: chloraprep       Needles:   Needle Type: Echogenic Stimulator Needle     Needle Length: 9cm 9 cm Needle Gauge: 21 and 21 G    Additional Needles:  Procedures: ultrasound guided (picture in chart) Interscalene brachial plexus block Narrative:  Start time: 05/26/2015 7:20 AM End time: 05/26/2015 7:25 AM Injection made incrementally with aspirations every 5 mL.  Performed by: Personally   Additional Notes: 20 cc 0.5% Bupivacaine with 1:200 Epi injected easily   Procedure Name: Intubation Date/Time: 05/26/2015 7:37 AM Performed by: Sita Mangen Pre-anesthesia Checklist: Patient identified, Emergency Drugs available, Suction available, Patient being monitored and Timeout performed Patient Re-evaluated:Patient Re-evaluated prior to inductionOxygen Delivery Method: Circle system utilized Preoxygenation: Pre-oxygenation with 100% oxygen Intubation Type: IV induction Ventilation: Mask ventilation without difficulty Laryngoscope Size: Mac and 4 Grade View: Grade I Tube type: Oral Tube size: 7.5 mm Number of attempts: 1 Airway Equipment and Method: Stylet Placement Confirmation: ETT inserted through vocal cords under direct vision,  positive ETCO2,  CO2 detector and breath sounds checked- equal and bilateral Secured at: 23 cm Tube secured with: Tape Dental Injury: Teeth and Oropharynx as per pre-operative assessment

## 2015-05-26 NOTE — Transfer of Care (Signed)
Immediate Anesthesia Transfer of Care Note  Patient: Tony Patrick  Procedure(s) Performed: Procedure(s) with comments: LEFT OPEN ACROMINECTOMY AND REPAIR WITH GRAFT AND ANCHOR ROTATOR CUFF TEAR  (Left) - Left shoulder block in holding  Patient Location: PACU  Anesthesia Type:GA combined with regional for post-op pain  Level of Consciousness:  sedated, patient cooperative and responds to stimulation  Airway & Oxygen Therapy:Patient Spontanous Breathing and Patient connected to face mask oxgen  Post-op Assessment:  Report given to PACU RN and Post -op Vital signs reviewed and stable  Post vital signs:  Reviewed and stable  Last Vitals:  Filed Vitals:   05/26/15 0537 05/26/15 0901  BP: 141/88   Pulse: 78 82  Temp: 36.6 C   Resp: 16 12    Complications: No apparent anesthesia complications

## 2015-05-26 NOTE — Anesthesia Postprocedure Evaluation (Signed)
Anesthesia Post Note  Patient: Tony Patrick  Procedure(s) Performed: Procedure(s) (LRB): LEFT OPEN ACROMINECTOMY AND REPAIR WITH GRAFT AND ANCHOR ROTATOR CUFF TEAR  (Left)  Patient location during evaluation: PACU Anesthesia Type: General Level of consciousness: awake, awake and alert and oriented Pain management: pain level controlled Vital Signs Assessment: post-procedure vital signs reviewed and stable Respiratory status: spontaneous breathing, nonlabored ventilation and respiratory function stable Cardiovascular status: blood pressure returned to baseline Anesthetic complications: no    Last Vitals:  Filed Vitals:   05/26/15 0537 05/26/15 0901  BP: 141/88 127/81  Pulse: 78 82  Temp: 36.6 C 36.5 C  Resp: 16 12    Last Pain:  Filed Vitals:   05/26/15 0939  PainSc: 0-No pain                 Sheriden Archibeque COKER

## 2015-05-27 DIAGNOSIS — M75102 Unspecified rotator cuff tear or rupture of left shoulder, not specified as traumatic: Secondary | ICD-10-CM | POA: Diagnosis not present

## 2015-05-27 MED ORDER — ENALAPRIL MALEATE 20 MG PO TABS: 20.0000 mg | ORAL_TABLET | Freq: Two times a day (BID) | ORAL | Status: DC

## 2015-05-27 NOTE — Evaluation (Addendum)
Occupational Therapy Evaluation Patient Details Name: Tony Patrick MRN: [id number] DOB: Oct 30, 1950 Today's Date: 05/27/2015    History of Present Illness s/p L RCR, complete tear   Clinical Impression   This 65 year old man was admitted for the above surgery.  All education was completed except for having wife don sling.  Will return with immobilizer per sx note.  Also, standard sling has a poor fit.      Follow Up Recommendations   (pt to follow up with Dr Gladstone Lighter)    Equipment Recommendations  None recommended by OT    Recommendations for Other Services       Precautions / Restrictions Precautions Precautions: Shoulder Type of Shoulder Precautions: conservative protocol; sling off only for bathing/dressing.  no shoulder ROM Required Braces or Orthoses: Sling Restrictions Weight Bearing Restrictions: Yes LUE Weight Bearing: Non weight bearing Other Position/Activity Restrictions: NWB      Mobility Bed Mobility Overal bed mobility: Needs Assistance Bed Mobility: Supine to Sit     Supine to sit: Min assist     General bed mobility comments: for trunk.  Pt plans to sleep in recliner  Transfers Overall transfer level: Needs assistance   Transfers: Sit to/from Stand;Stand Pivot Transfers Sit to Stand: Min guard Stand pivot transfers: Min guard       General transfer comment: for safety    Balance                                            ADL Overall ADL's : Needs assistance/impaired     Grooming: Set up;Sitting   Upper Body Bathing: Sitting;Moderate assistance   Lower Body Bathing: Moderate assistance;Sit to/from stand   Upper Body Dressing : Maximal assistance;Sitting   Lower Body Dressing: Maximal assistance;Sit to/from stand   Toilet Transfer: Min guard;Ambulation;RW (to chair for safety)   Toileting- Clothing Manipulation and Hygiene: Minimal assistance;Sit to/from stand         General ADL Comments: reviewed  shoulder protocol and handout given. Demonstrated seesawing under LUE to wash and donned shirt.  Wife had assisted with sleeveless tshirt and recommended pt go without that to avoid jarring the shoulder.   Wife and pt verbalize understanding of all education.  Pt is in a standard sling with poor fit (XL).  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  Still has some residual numbness; able to move fingers           Communication Communication Communication: No difficulties   Cognition Arousal/Alertness: Awake/alert Behavior During Therapy: WFL for tasks assessed/performed Overall Cognitive Status: Within Functional Limits for tasks assessed                     General Comments       Exercises       Shoulder Instructions      Home Living Family/patient expects to be discharged to:: Private residence Living Arrangements: Spouse/significant other  Additional Comments: apt does not use UEs to get on/off [place]      Prior Functioning/Environment Level of Independence: Independent             OT Diagnosis: Acute pain   OT Problem List: Decreased strength;Decreased range of motion;Pain   OT Treatment/Interventions: Patient/family education    OT Goals(Current goals can be found in the care plan section) Acute Rehab OT Goals Patient Stated Goal: get back to being active OT Goal Formulation: With patient Time For Goal  Achievement: 05/28/15 Potential to Achieve Goals: Good  Goal:  Wife will don sling with supervision  OT Frequency:  (one more visit)   Barriers to D/C:            Co-evaluation              End of Session    Activity Tolerance: Patient tolerated treatment well Patient left: in bed;with call bell/phone within reach;with nursing/sitter in room   Time: [id number] OT Time Calculation (min): 28 min Charges:  OT General Charges $OT Visit: 1 Procedure OT Evaluation $OT Eval Low Complexity: 1 Procedure OT Treatments $Self Care/Home Management : 8-22 mins G-Codes: OT G-codes **NOT FOR INPATIENT CLASS** Functional Assessment Tool Used: clinical judgment and observation Functional Limitation: Self care Self Care Current Status [id number]): At least 60 percent but less than 80 percent impaired, limited or restricted Self Care Goal Status [id number]): At least 60 percent but less than 80 percent impaired, limited or restricted  [facility] 05/27/2015, 10:23 AM  Lesle Chris, OTR/L [phone] 05/27/2015

## 2015-05-27 NOTE — Progress Notes (Signed)
   05/27/15 1000  OT Visit Information  Last OT Received On 05/27/15  Assistance Needed +1  History of Present Illness s/p L RCR, complete tear  Precautions  Precautions Shoulder  Type of Shoulder Precautions conservative protocol; sling off only for bathing/dressing.  no shoulder ROM  Required Braces or Orthoses Sling  Pain Assessment  Pain Assessment Faces  Faces Pain Scale 6  Pain Location L shoulder  Pain Descriptors / Indicators Aching  Pain Intervention(s) Limited activity within patient's tolerance;Monitored during session;Premedicated before session;Repositioned (recommended he put ice back on in 15 minutes)  Cognition  Arousal/Alertness Awake/alert  Behavior During Therapy WFL for tasks assessed/performed  Overall Cognitive Status Within Functional Limits for tasks assessed  ADL  General ADL Comments switched XL standard sling for L sling immobilizer (immobilizer planned per sx note) and got much better fit.  Wife donned sling with supervision.  Restrictions  Other Position/Activity Restrictions NWB  OT - End of Session  Activity Tolerance Patient tolerated treatment well  Patient left in chair;with call bell/phone within reach  OT Assessment/Plan  Follow Up Recommendations (Pt will follow up with Dr Gladstone Lighter)  OT Equipment None recommended by OT  OT Goal Progression  Progress towards OT goals Goals met/education completed, patient discharged from OT  OT Time Calculation  OT Start Time (ACUTE ONLY) 0926  OT Stop Time (ACUTE ONLY) 0938  OT Time Calculation (min) 12 min  OT G-codes **NOT FOR INPATIENT CLASS**  Self Care Discharge Status (Q1901) CL  OT General Charges  $OT Visit 1 Procedure  OT Treatments  $Self Care/Home Management  8-22 mins  Lesle Chris, OTR/L [phone] 05/27/2015

## 2015-05-27 NOTE — Op Note (Signed)
Tony Patrick, BARTOLOTTI NO.:  [id number]  MEDICAL RECORD NO.:  [id number]  LOCATION:  [id number]                         FACILITY:  [facility]  PHYSICIAN:  Kipp Brood. Haliey Romberg, M.D.DATE OF BIRTH:  [birth year]  DATE OF PROCEDURE: DATE OF DISCHARGE:                              OPERATIVE REPORT   SURGEON:  Bernadett Milian A. Gladstone Lighter, MD.  ASSISTANT:  Ardeen Jourdain, PA.  PREOPERATIVE DIAGNOSIS:  A severe retracted complex tear of the left rotator cuff.  POSTOPERATIVE DIAGNOSIS:  A severe retracted complex tear of the left rotator cuff.  OPERATION: 1. Open acromionectomy and acromioplasty. 2. Repair of the severe retracted complex tear of the left rotator     cuff. 3. Dermis band graft to the rotator cuff. 4. One Stryker anchor was used.  DESCRIPTION OF PROCEDURE:  Under general anesthesia, the patient in a beach-chair position.  The routine orthopedic prep and draping of the left upper extremity was carried out.  The appropriate time-out was first carried out.  I also marked the appropriate left arm in the holding area.  The incision was made over the anterior aspect of the left shoulder.  Bleeders were identified and cauterized.  At this time, the deltoid tendon was stripped by sharp dissection from the acromion. A small upper part of the deltoid muscle was split.  We then exposed the acromion.  He had a severely thickened acromion.  We protected the underlying cuff and did an open acromionectomy with the oscillating saw, then an acromioplasty utilizing the burr.  We then thoroughly irrigated out the area.  Bone waxed the undersurface of the acromion.  Immediately upon going in to the cup area, there was a large amount of joint fluid came out.  Note, we then went down to identify the rotator cuff.  It was partially absent and the remaining part was retracted.  We had an open exposure there also to the long head of the biceps tendon.  So we retracted the tendon as much we could  at this time and burred the lateral articular surface of the humerus to give [place] a good bleeding bed. I then reapproximated the tendon as well as we could.  We had to utilize the long head of the biceps for part of the repair because of the severe complexity of the injury.  After this, we then utilized 1 anchor in the proximal humerus and applied a dermis band graft.  We thoroughly irrigated out the area.  I inserted some Ultrafoam Gelfoam material and then reapproximated tendon muscle in usual fashion.  Subcu was closed with running, locking Monocryl suture and sterile dressings were applied.  He had 2 g of IV Ancef preop.  He then will be placed in a shoulder immobilizer and kept overnight for pain control.          ______________________________ Kipp Brood. Gladstone Lighter, M.D.     RAG/MEDQ  D:  05/26/2015  T:  05/27/2015  Job:  [id number]

## 2015-05-27 NOTE — Progress Notes (Signed)
Discharged from floor via w/c for transport home by car. Belongings & family with pt. No changes in assessment. Durrell Barajas  

## 2015-05-27 NOTE — Progress Notes (Signed)
   Subjective:  Patient reports pain as mild to moderate.  No c/o. Denies N/V/CP/SOB.  Objective:   VITALS:   Filed Vitals:   05/26/15 1814 05/26/15 2248 05/26/15 2334 05/27/15 0435  BP: 114/69 115/62 111/65 112/67  Pulse: 72  70 65  Temp: 97.6 F (36.4 C)  98.3 F (36.8 C) 97.6 F (36.4 C)  TempSrc: Axillary  Oral Oral  Resp: 17  16 16   Height:      Weight:      SpO2: 93%  93% 94%    ABD soft Sensation intact distally Intact pulses distally Incision: dressing C/D/I (+) AIN, PIN, U SILT Ax, MC, R, U, M Sling in place  Lab Results  Component Value Date   WBC 9.2 05/23/2015   HGB 15.7 05/23/2015   HCT 46.8 05/23/2015   MCV 90.0 05/23/2015   PLT 191 05/23/2015   BMET    Component Value Date/Time   NA 141 05/23/2015 1400   K 4.1 05/23/2015 1400   CL 109 05/23/2015 1400   CO2 23 05/23/2015 1400   GLUCOSE 95 05/23/2015 1400   BUN 17 05/23/2015 1400   CREATININE 0.82 05/23/2015 1400   CALCIUM 9.3 05/23/2015 1400   GFRNONAA >60 05/23/2015 1400   GFRAA >60 05/23/2015 1400     Assessment/Plan: 1 Day Post-Op   Active Problems:   Tear of rotator cuff   Advance diet Up with therapy Sling at all times D/C home today   Elie Goody 05/27/2015, 7:48 AM   Rod Can, MD Cell [phone]

## 2015-05-29 ENCOUNTER — Encounter (HOSPITAL_COMMUNITY): Payer: Self-pay | Admitting: Orthopedic Surgery

## 2015-05-29 ENCOUNTER — Other Ambulatory Visit: Payer: Self-pay | Admitting: *Deleted

## 2015-05-30 ENCOUNTER — Encounter (HOSPITAL_COMMUNITY): Payer: Self-pay | Admitting: Orthopedic Surgery

## 2015-05-30 NOTE — Discharge Summary (Signed)
Physician Discharge Summary   Patient ID: Tony Patrick MRN: [id number] DOB/AGE: 1950-03-06 65 y.o.  Admit date: 05/26/2015 Discharge date: 05/27/2015  Primary Diagnosis: Left shoulder rotator cuff tear   Admission Diagnoses:  Past Medical History  Diagnosis Date  . CAD (coronary artery disease)     Anterior MI, [facility], 1995, no PCI, total LAD  / catheterization 2001, 30% LAD beyond the origin of a large diagonal, circumflex normal, RCA normal, anterolateral and apical  akinesis, ejection fraction 25-35% range  . Kidney stones 1990's  . GERD (gastroesophageal reflux disease)   . S/P colectomy     Benign tumor  . Dyslipidemia   . Ejection fraction < 50%     EF 25-35%, catheterization 2001 / EF 20-25%, global hypokinesis, echo in June, 2012  . Hypoxia     Pneumonia, hospitalization, June, 2012  . Basal cell carcinoma   . MI (myocardial infarction) ([place]) 1995  . Pneumonia 2012   Discharge Diagnoses:   Active Problems:   Tear of rotator cuff  Estimated body mass index is 35.72 kg/(m^2) as calculated from the following:   Height as of this encounter: _0  (1.753 m).   Weight as of this encounter: 109.77 kg (242 lb).  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 The injury occurred about 2 months ago. He did not see improvement with conservative measures. MRIs showed a complex tear of the rotator cuff on the left shoulder.   Laboratory Data: Hospital Outpatient Visit on 05/23/2015  Component Date Value Ref Range Status  . WBC 05/23/2015 9.2  4.0 - 10.5 K/uL Final  . RBC 05/23/2015 5.20  4.22 - 5.81 MIL/uL Final  . Hemoglobin 05/23/2015 15.7  13.0 - 17.0 g/dL Final  . HCT 05/23/2015 46.8  39.0 - 52.0 % Final  . MCV 05/23/2015 90.0  78.0 - 100.0 fL  Final  . MCH 05/23/2015 30.2  26.0 - 34.0 pg Final  . MCHC 05/23/2015 33.5  30.0 - 36.0 g/dL Final  . RDW 05/23/2015 13.6  11.5 - 15.5 % Final  . Platelets 05/23/2015 191  150 - 400 K/uL Final  . Sodium 05/23/2015 141  135 - 145 mmol/L Final  . Potassium 05/23/2015 4.1  3.5 - 5.1 mmol/L Final  . Chloride 05/23/2015 109  101 - 111 mmol/L Final  . CO2 05/23/2015 23  22 - 32 mmol/L Final  . Glucose, Bld 05/23/2015 95  65 - 99 mg/dL Final  . BUN 05/23/2015 17  6 - 20 mg/dL Final  . Creatinine, Ser 05/23/2015 0.82  0.61 - 1.24 mg/dL Final  . Calcium 05/23/2015 9.3  8.9 - 10.3 mg/dL Final  . GFR calc non Af Amer 05/23/2015 >60  >60 mL/min Final  . GFR calc Af Amer 05/23/2015 >60  >60 mL/min Final   Comment: (NOTE) The eGFR has been calculated using the CKD EPI equation. This calculation has not been validated in all clinical situations. eGFR's persistently <60 mL/min signify possible Chronic Kidney Disease.   . Anion gap 05/23/2015 9  5 - 15 Final     EKG: Orders placed or performed in visit on 05/16/15  . EKG 12-Lead     Hospital Course: Tony Patrick is a 65 y.o. who was admitted to  [facility]. They were brought to the operating room on 05/26/2015 and underwent Procedure(s): LEFT OPEN ACROMINECTOMY AND REPAIR WITH GRAFT AND ANCHOR ROTATOR CUFF TEAR .  Patient tolerated the procedure well and was later transferred to the recovery room and then to the orthopaedic floor for postoperative care.  They were given PO and IV analgesics for pain control following their surgery.  They were given 24 hours of postoperative antibiotics of  Anti-infectives    Start     Dose/Rate Route Frequency Ordered Stop   05/26/15 1400  ceFAZolin (ANCEF) IVPB 1 g/50 mL premix     1 g 100 mL/hr over 30 Minutes Intravenous Every 6 hours 05/26/15 1015 05/27/15 0430   05/26/15 0800  polymyxin B 500,000 Units, bacitracin 50,000 Units in sodium chloride irrigation 0.9 % 500 mL irrigation  Status:   Discontinued       As needed 05/26/15 0813 05/26/15 0907   05/26/15 0537  ceFAZolin (ANCEF) IVPB 2g/100 mL premix     2 g 200 mL/hr over 30 Minutes Intravenous On call to O.R. 05/26/15 5102 05/26/15 0740     and started on DVT prophylaxis in the form of Aspirin.   OT were ordered was ordered for ADL instruction and sling management.   Discharge planning consulted to help with postop disposition and equipment needs.  Patient had a fair night on the evening of surgery.  Patient was seen in rounds and was ready to go home.   Diet: Cardiac diet Activity:Wear sling at all times Follow-up:in 2 weeks Disposition - Home Discharged Condition: stable   Discharge Instructions    Call MD / Call 911    Complete by:  As directed   If you experience chest pain or shortness of breath, CALL 911 and be transported to the hospital emergency room.  If you develope a fever above 101 F, pus (white drainage) or increased drainage or redness at the wound, or calf pain, call your surgeon's office.     Call MD / Call 911    Complete by:  As directed   If you experience chest pain or shortness of breath, CALL 911 and be transported to the hospital emergency room.  If you develope a fever above 101 F, pus (white drainage) or increased drainage or redness at the wound, or calf pain, call your surgeon's office.     Constipation Prevention    Complete by:  As directed   Drink plenty of fluids.  Prune juice may be helpful.  You may use a stool softener, such as Colace (over the counter) 100 mg twice a day.  Use MiraLax (over the counter) for constipation as needed.     Constipation Prevention    Complete by:  As directed   Drink plenty of fluids.  Prune juice may be helpful.  You may use a stool softener, such as Colace (over the counter) 100 mg twice a day.  Use MiraLax (over the counter) for constipation as needed.     Diet - low sodium heart healthy    Complete by:  As directed      Diet - low sodium heart healthy     Complete by:  As directed      Discharge instructions    Complete by:  As directed   Keep your sling on at all times, including sleeping in your sling. The only time you should remove your sling is to shower only but you need to keep your hand against  your chest while you shower.  Do not remove the dressing. It is waterproof and will stay on until your first visit in the office. Call Dr. Gladstone Lighter if any wound complications or temperature of 101 degrees F or over.  Call the office for an appointment to see Dr. Gladstone Lighter in two weeks: [phone] and ask for Dr. Charlestine Night nurse, Brunilda Payor.     Discharge instructions    Complete by:  As directed   Sling at all times     Driving restrictions    Complete by:  As directed   No driving for 6 weeks     Increase activity slowly as tolerated    Complete by:  As directed      Increase activity slowly as tolerated    Complete by:  As directed      Lifting restrictions    Complete by:  As directed   No lifting for 12 weeks            Medication List    STOP taking these medications        traMADol 50 MG tablet  Commonly known as:  ULTRAM      TAKE these medications        albuterol 108 (90 Base) MCG/ACT inhaler  Commonly known as:  PROVENTIL HFA;VENTOLIN HFA  Inhale 2 puffs into the lungs every 6 (six) hours as needed for wheezing or shortness of breath.     aspirin EC 81 MG tablet  Take 81 mg by mouth daily.     carvedilol 25 MG tablet  Commonly known as:  COREG  Take 1 tablet (25 mg total) by mouth 2 (two) times daily.     diclofenac sodium 1 % Gel  Commonly known as:  VOLTAREN  Apply 1 application topically 3 (three) times daily as needed (for hands).     enalapril 20 MG tablet  Commonly known as:  VASOTEC  Take 20 mg by mouth 2 (two) times daily.     HYDROcodone-homatropine 5-1.5 MG/5ML syrup  Commonly known as:  HYCODAN  Take 5 mLs by mouth every 6 (six) hours as needed for cough.     ibuprofen 800 MG tablet    Commonly known as:  ADVIL,MOTRIN  Take 800 mg by mouth every 8 (eight) hours as needed for moderate pain.     ipratropium-albuterol 0.5-2.5 (3) MG/3ML Soln  Commonly known as:  DUONEB  Take 3 mLs by nebulization every 6 (six) hours as needed (For shortness of breath.).     loratadine 10 MG tablet  Commonly known as:  CLARITIN  Take 10 mg by mouth daily.     omeprazole 20 MG capsule  Commonly known as:  PRILOSEC  Take 20 mg by mouth Daily.     oxyCODONE-acetaminophen 5-325 MG tablet  Commonly known as:  PERCOCET/ROXICET  Take 2 tablets by mouth every 3 (three) hours as needed for severe pain (Q4-6 hours PRN).     simvastatin 40 MG tablet  Commonly known as:  ZOCOR  Take 40 mg by mouth at bedtime.     tamsulosin 0.4 MG Caps capsule  Commonly known as:  FLOMAX  Take 0.4 mg by mouth 2 (two) times daily.           Follow-up Information    Follow up with GIOFFRE,RONALD A, MD In 12 days.   Specialty:  Orthopedic Surgery   Contact information:   [address] [place] [address] [phone]  Signed: Ardeen Jourdain, PA-C Orthopaedic Surgery 05/30/2015, 8:03 AM

## 2015-06-01 DIAGNOSIS — S46012D Strain of muscle(s) and tendon(s) of the rotator cuff of left shoulder, subsequent encounter: Secondary | ICD-10-CM | POA: Diagnosis not present

## 2015-06-08 DIAGNOSIS — Z4789 Encounter for other orthopedic aftercare: Secondary | ICD-10-CM | POA: Diagnosis not present

## 2015-06-21 DIAGNOSIS — A084 Viral intestinal infection, unspecified: Secondary | ICD-10-CM | POA: Diagnosis not present

## 2015-06-21 DIAGNOSIS — Z0000 Encounter for general adult medical examination without abnormal findings: Secondary | ICD-10-CM | POA: Diagnosis not present

## 2015-06-21 DIAGNOSIS — E785 Hyperlipidemia, unspecified: Secondary | ICD-10-CM | POA: Diagnosis not present

## 2015-06-21 DIAGNOSIS — Z125 Encounter for screening for malignant neoplasm of prostate: Secondary | ICD-10-CM | POA: Diagnosis not present

## 2015-07-17 ENCOUNTER — Other Ambulatory Visit: Payer: Self-pay | Admitting: Orthopedic Surgery

## 2015-07-17 ENCOUNTER — Encounter (HOSPITAL_COMMUNITY): Payer: Self-pay | Admitting: *Deleted

## 2015-07-19 ENCOUNTER — Ambulatory Visit (HOSPITAL_COMMUNITY): Payer: Worker's Compensation | Admitting: Certified Registered"

## 2015-07-19 ENCOUNTER — Ambulatory Visit (HOSPITAL_COMMUNITY)
Admission: RE | Admit: 2015-07-19 | Discharge: 2015-07-19 | Disposition: A | Discharge status: Home / Self Care | Payer: Worker's Compensation | Source: Ambulatory Visit | Attending: Orthopedic Surgery | Admitting: Orthopedic Surgery

## 2015-07-19 ENCOUNTER — Encounter (HOSPITAL_COMMUNITY)
Admission: RE | Disposition: A | Discharge status: Home / Self Care | Payer: Self-pay | Source: Ambulatory Visit | Attending: Orthopedic Surgery

## 2015-07-19 ENCOUNTER — Encounter (HOSPITAL_COMMUNITY): Payer: Self-pay

## 2015-07-19 DIAGNOSIS — Z85828 Personal history of other malignant neoplasm of skin: Secondary | ICD-10-CM | POA: Insufficient documentation

## 2015-07-19 DIAGNOSIS — Z87442 Personal history of urinary calculi: Secondary | ICD-10-CM | POA: Diagnosis not present

## 2015-07-19 DIAGNOSIS — Z79899 Other long term (current) drug therapy: Secondary | ICD-10-CM | POA: Diagnosis not present

## 2015-07-19 DIAGNOSIS — I499 Cardiac arrhythmia, unspecified: Secondary | ICD-10-CM | POA: Insufficient documentation

## 2015-07-19 DIAGNOSIS — Z87891 Personal history of nicotine dependence: Secondary | ICD-10-CM | POA: Insufficient documentation

## 2015-07-19 DIAGNOSIS — S83212A Bucket-handle tear of medial meniscus, current injury, left knee, initial encounter: Secondary | ICD-10-CM | POA: Diagnosis present

## 2015-07-19 DIAGNOSIS — M1990 Unspecified osteoarthritis, unspecified site: Secondary | ICD-10-CM | POA: Diagnosis not present

## 2015-07-19 DIAGNOSIS — M94262 Chondromalacia, left knee: Secondary | ICD-10-CM | POA: Insufficient documentation

## 2015-07-19 DIAGNOSIS — I2510 Atherosclerotic heart disease of native coronary artery without angina pectoris: Secondary | ICD-10-CM | POA: Insufficient documentation

## 2015-07-19 DIAGNOSIS — Z7982 Long term (current) use of aspirin: Secondary | ICD-10-CM | POA: Insufficient documentation

## 2015-07-19 DIAGNOSIS — I252 Old myocardial infarction: Secondary | ICD-10-CM | POA: Insufficient documentation

## 2015-07-19 DIAGNOSIS — E785 Hyperlipidemia, unspecified: Secondary | ICD-10-CM | POA: Insufficient documentation

## 2015-07-19 DIAGNOSIS — Z952 Presence of prosthetic heart valve: Secondary | ICD-10-CM | POA: Insufficient documentation

## 2015-07-19 DIAGNOSIS — Z885 Allergy status to narcotic agent status: Secondary | ICD-10-CM | POA: Insufficient documentation

## 2015-07-19 DIAGNOSIS — Z91041 Radiographic dye allergy status: Secondary | ICD-10-CM | POA: Diagnosis not present

## 2015-07-19 HISTORY — DX: Personal history of other diseases of the respiratory system: Z87.09

## 2015-07-19 HISTORY — PX: KNEE ARTHROSCOPY: SHX127

## 2015-07-19 HISTORY — DX: Unspecified osteoarthritis, unspecified site: M19.90

## 2015-07-19 LAB — BASIC METABOLIC PANEL
Anion gap: 5 (ref 5–15)
BUN: 15 mg/dL (ref 6–20)
CALCIUM: 8.9 mg/dL (ref 8.9–10.3)
CO2: 24 mmol/L (ref 22–32)
Chloride: 109 mmol/L (ref 101–111)
Creatinine, Ser: 0.88 mg/dL (ref 0.61–1.24)
Glucose, Bld: 106 mg/dL — ABNORMAL HIGH (ref 65–99)
Potassium: 3.9 mmol/L (ref 3.5–5.1)
Sodium: 138 mmol/L (ref 135–145)

## 2015-07-19 LAB — CBC
HCT: 45.3 % (ref 39.0–52.0)
HEMOGLOBIN: 14.8 g/dL (ref 13.0–17.0)
MCH: 29.4 pg (ref 26.0–34.0)
MCHC: 32.7 g/dL (ref 30.0–36.0)
MCV: 90.1 fL (ref 78.0–100.0)
PLATELETS: 175 10*3/uL (ref 150–400)
RBC: 5.03 MIL/uL (ref 4.22–5.81)
RDW: 14 % (ref 11.5–15.5)
WBC: 7.7 10*3/uL (ref 4.0–10.5)

## 2015-07-19 SURGERY — ARTHROSCOPY, KNEE
Anesthesia: General | Site: Knee | Laterality: Left

## 2015-07-19 MED ORDER — PROPOFOL 10 MG/ML IV BOLUS
INTRAVENOUS | Status: DC | PRN
  Administered 2015-07-19: 150 mg via INTRAVENOUS

## 2015-07-19 MED ORDER — BACITRACIN-NEOMYCIN-POLYMYXIN OINTMENT TUBE
TOPICAL_OINTMENT | CUTANEOUS | Status: DC | PRN
  Administered 2015-07-19: 1 via TOPICAL

## 2015-07-19 MED ORDER — LIDOCAINE HCL (CARDIAC) 20 MG/ML IV SOLN
INTRAVENOUS | Status: AC
  Filled 2015-07-19: qty 5

## 2015-07-19 MED ORDER — FENTANYL CITRATE (PF) 100 MCG/2ML IJ SOLN
INTRAMUSCULAR | Status: AC
  Filled 2015-07-19: qty 2

## 2015-07-19 MED ORDER — LACTATED RINGERS IR SOLN
Status: DC | PRN
  Administered 2015-07-19: 6000 mL

## 2015-07-19 MED ORDER — FENTANYL CITRATE (PF) 100 MCG/2ML IJ SOLN
INTRAMUSCULAR | Status: DC | PRN
  Administered 2015-07-19 (×2): 50 ug via INTRAVENOUS

## 2015-07-19 MED ORDER — LACTATED RINGERS IV SOLN
INTRAVENOUS | Status: DC | PRN
  Administered 2015-07-19 (×2): via INTRAVENOUS

## 2015-07-19 MED ORDER — LACTATED RINGERS IV SOLN: INTRAVENOUS | Status: DC

## 2015-07-19 MED ORDER — PROPOFOL 10 MG/ML IV BOLUS
INTRAVENOUS | Status: AC
  Filled 2015-07-19: qty 20

## 2015-07-19 MED ORDER — LIDOCAINE HCL (CARDIAC) 20 MG/ML IV SOLN
INTRAVENOUS | Status: DC | PRN
  Administered 2015-07-19: 50 mg via INTRAVENOUS

## 2015-07-19 MED ORDER — STERILE WATER FOR IRRIGATION IR SOLN
Status: DC | PRN
  Administered 2015-07-19: 500 mL

## 2015-07-19 MED ORDER — FENTANYL CITRATE (PF) 100 MCG/2ML IJ SOLN
25.0000 ug | INTRAMUSCULAR | Status: DC | PRN
  Administered 2015-07-19 (×2): 25 ug via INTRAVENOUS

## 2015-07-19 MED ORDER — BUPIVACAINE-EPINEPHRINE 0.25% -1:200000 IJ SOLN
INTRAMUSCULAR | Status: AC
  Filled 2015-07-19: qty 1

## 2015-07-19 MED ORDER — HYDROMORPHONE HCL 2 MG/ML IJ SOLN
INTRAMUSCULAR | Status: AC
  Filled 2015-07-19: qty 1

## 2015-07-19 MED ORDER — CEFAZOLIN SODIUM-DEXTROSE 2-4 GM/100ML-% IV SOLN
INTRAVENOUS | Status: AC
  Filled 2015-07-19: qty 100

## 2015-07-19 MED ORDER — MIDAZOLAM HCL 2 MG/2ML IJ SOLN
INTRAMUSCULAR | Status: AC
  Filled 2015-07-19: qty 2

## 2015-07-19 MED ORDER — CHLORHEXIDINE GLUCONATE 4 % EX LIQD: 60.0000 mL | Freq: Once | CUTANEOUS | Status: DC

## 2015-07-19 MED ORDER — CEFAZOLIN SODIUM-DEXTROSE 2-4 GM/100ML-% IV SOLN
2.0000 g | INTRAVENOUS | Status: AC
  Administered 2015-07-19: 2 g via INTRAVENOUS

## 2015-07-19 MED ORDER — OXYCODONE-ACETAMINOPHEN 5-325 MG PO TABS
1.0000 | ORAL_TABLET | ORAL | Status: DC | PRN
Start: 2015-07-19 — End: 2015-07-19
  Administered 2015-07-19: 1 via ORAL
  Filled 2015-07-19: qty 1

## 2015-07-19 MED ORDER — OXYCODONE-ACETAMINOPHEN 10-325 MG PO TABS: 1.0000 | ORAL_TABLET | ORAL | Status: DC | PRN

## 2015-07-19 MED ORDER — MIDAZOLAM HCL 5 MG/5ML IJ SOLN
INTRAMUSCULAR | Status: DC | PRN
  Administered 2015-07-19: 2 mg via INTRAVENOUS

## 2015-07-19 MED ORDER — ONDANSETRON HCL 4 MG/2ML IJ SOLN
INTRAMUSCULAR | Status: AC
  Filled 2015-07-19: qty 2

## 2015-07-19 MED ORDER — BACITRACIN-NEOMYCIN-POLYMYXIN 400-5-5000 EX OINT
TOPICAL_OINTMENT | CUTANEOUS | Status: AC
  Filled 2015-07-19: qty 1

## 2015-07-19 MED ORDER — ONDANSETRON HCL 4 MG/2ML IJ SOLN
INTRAMUSCULAR | Status: DC | PRN
  Administered 2015-07-19: 4 mg via INTRAVENOUS

## 2015-07-19 MED ORDER — HYDROMORPHONE HCL 1 MG/ML IJ SOLN
INTRAMUSCULAR | Status: DC | PRN
  Administered 2015-07-19: 0.5 mg via INTRAVENOUS
  Administered 2015-07-19: 1 mg via INTRAVENOUS
  Administered 2015-07-19: 0.5 mg via INTRAVENOUS

## 2015-07-19 MED ORDER — BUPIVACAINE-EPINEPHRINE 0.25% -1:200000 IJ SOLN
INTRAMUSCULAR | Status: DC | PRN
  Administered 2015-07-19: 30 mL

## 2015-07-19 MED ORDER — OXYCODONE HCL 5 MG PO TABS
5.0000 mg | ORAL_TABLET | ORAL | Status: DC | PRN
  Administered 2015-07-19: 5 mg via ORAL
  Filled 2015-07-19: qty 1

## 2015-07-19 SURGICAL SUPPLY — 33 items
BANDAGE ACE 4X5 VEL STRL LF (GAUZE/BANDAGES/DRESSINGS) ×2 IMPLANT
BANDAGE ACE 6X5 VEL STRL LF (GAUZE/BANDAGES/DRESSINGS) ×1 IMPLANT
BLADE GREAT WHITE 4.2 (BLADE) IMPLANT
BLADE SURG SZ11 CARB STEEL (BLADE) IMPLANT
BNDG COHESIVE 6X5 TAN STRL LF (GAUZE/BANDAGES/DRESSINGS) ×2 IMPLANT
COVER SURGICAL LIGHT HANDLE (MISCELLANEOUS) ×2 IMPLANT
DRAPE SHEET LG 3/4 BI-LAMINATE (DRAPES) ×2 IMPLANT
DRSG ADAPTIC 3X8 NADH LF (GAUZE/BANDAGES/DRESSINGS) ×1 IMPLANT
DRSG EMULSION OIL 3X3 NADH (GAUZE/BANDAGES/DRESSINGS) ×2 IMPLANT
DRSG PAD ABDOMINAL 8X10 ST (GAUZE/BANDAGES/DRESSINGS) ×4 IMPLANT
DURAPREP 26ML APPLICATOR (WOUND CARE) ×2 IMPLANT
GAUZE SPONGE 4X4 12PLY STRL (GAUZE/BANDAGES/DRESSINGS) ×1 IMPLANT
GAUZE SPONGE 4X4 16PLY XRAY LF (GAUZE/BANDAGES/DRESSINGS) ×2 IMPLANT
GLOVE BIOGEL PI IND STRL 7.5 (GLOVE) IMPLANT
GLOVE BIOGEL PI IND STRL 8 (GLOVE) ×1 IMPLANT
GLOVE BIOGEL PI INDICATOR 7.5 (GLOVE) ×2
GLOVE BIOGEL PI INDICATOR 8 (GLOVE) ×1
GLOVE ECLIPSE 8.0 STRL XLNG CF (GLOVE) ×3 IMPLANT
GOWN STRL REUS W/TWL XL LVL3 (GOWN DISPOSABLE) ×3 IMPLANT
KIT BASIN OR (CUSTOM PROCEDURE TRAY) ×2 IMPLANT
MANIFOLD NEPTUNE II (INSTRUMENTS) ×2 IMPLANT
MARKER PEN SURG W/LABELS BLK (STERILIZATION PRODUCTS) ×2 IMPLANT
PACK ARTHROSCOPY WL (CUSTOM PROCEDURE TRAY) ×2 IMPLANT
PACK ICE MAXI GEL EZY WRAP (MISCELLANEOUS) ×2 IMPLANT
PAD ABD 8X10 STRL (GAUZE/BANDAGES/DRESSINGS) ×3 IMPLANT
PAD MASON LEG HOLDER (PIN) ×2 IMPLANT
PADDING CAST COTTON 6X4 STRL (CAST SUPPLIES) ×1 IMPLANT
SUT ETHILON 3 0 PS 1 (SUTURE) ×2 IMPLANT
SYR 20CC LL (SYRINGE) ×2 IMPLANT
TOWEL OR 17X26 10 PK STRL BLUE (TOWEL DISPOSABLE) ×2 IMPLANT
TUBING ARTHRO INFLOW-ONLY STRL (TUBING) ×2 IMPLANT
TUBING CONNECTING 10 (TUBING) ×2 IMPLANT
WRAP KNEE MAXI GEL POST OP (GAUZE/BANDAGES/DRESSINGS) ×2 IMPLANT

## 2015-07-19 NOTE — Transfer of Care (Signed)
Immediate Anesthesia Transfer of Care Note  Patient: Tony Patrick  Procedure(s) Performed: Procedure(s) with comments: LEFT ARTHROSCOPY KNEE WITH MEDIAL MENISCECTOMY (Left) - LMA  Patient Location: PACU  Anesthesia Type:General  Level of Consciousness: sedated  Airway & Oxygen Therapy: Patient Spontanous Breathing and Patient connected to face mask oxygen  Post-op Assessment: Report given to RN and Post -op Vital signs reviewed and stable  Post vital signs: Reviewed and stable  Last Vitals:  Filed Vitals:   07/19/15 1105  BP: 131/87  Pulse: 64  Temp: 36.4 C  Resp: 18    Last Pain: There were no vitals filed for this visit.    Patients Stated Pain Goal: 4 ([id number])  Complications: No apparent anesthesia complications

## 2015-07-19 NOTE — H&P (Signed)
Raymone Pembroke is an 65 y.o. male.   Chief Complaint: Pain and Locking of left Knee. HPI: Progressive pain and swelling of Left Knee. Recent MRI showed a torn Medial Meniscus.  Past Medical History  Diagnosis Date  . CAD (coronary artery disease)     Anterior MI, [facility], 1995, no PCI, total LAD  / catheterization 2001, 30% LAD beyond the origin of a large diagonal, circumflex normal, RCA normal, anterolateral and apical  akinesis, ejection fraction 25-35% range  . Kidney stones 1990's  . GERD (gastroesophageal reflux disease)   . S/P colectomy     Benign tumor  . Dyslipidemia   . Ejection fraction < 50%     EF 25-35%, catheterization 2001 / EF 20-25%, global hypokinesis, echo in June, 2012  . Hypoxia     Pneumonia, hospitalization, June, 2012  . Basal cell carcinoma   . MI (myocardial infarction) ([place]) 1995  . Pneumonia 2012  . Dysrhythmia   . History of bronchitis   . Arthritis     Past Surgical History  Procedure Laterality Date  . Colectomy  1981    benign mass  . Leg surgery Left     cyst  . Pleural scarification  1978  . Chest tube insertion    . Colonoscopy with propofol N/A 07/04/2014    Procedure: COLONOSCOPY WITH PROPOFOL;  Surgeon: Gatha Mayer, MD;  Location: [facility];  Service: Endoscopy;  Laterality: N/A;  . Cardiac valve replacement    . Shoulder open rotator cuff repair Left 05/26/2015    Procedure: LEFT OPEN ACROMINECTOMY AND REPAIR WITH GRAFT AND ANCHOR ROTATOR CUFF TEAR ;  Surgeon: Latanya Maudlin, MD;  Location: [facility];  Service: Orthopedics;  Laterality: Left;  Left shoulder block in holding  . Cardiac catheterization      Family History  Problem Relation Age of Onset  . Diabetes Mother   . Hypertension Mother   . Cancer Maternal Grandmother     type unknown  . Melanoma Brother   . Cancer Brother     type unknown  . Prostatitis Brother    Social History:  reports that he quit smoking about 23 years ago. His smoking use included Cigarettes. He  has a 64 pack-year smoking history. He has never used smokeless tobacco. He reports that he does not drink alcohol or use illicit drugs.  Allergies:  Allergies  Allergen Reactions  . Ivp Dye [Iodinated Diagnostic Agents] Swelling and Other (See Comments)    Arrest  . Morphine And Related Nausea And Vomiting    Medications Prior to Admission  Medication Sig Dispense Refill  . albuterol (PROVENTIL HFA;VENTOLIN HFA) 108 (90 BASE) MCG/ACT inhaler Inhale 2 puffs into the lungs every 6 (six) hours as needed for wheezing or shortness of breath.    Marland Kitchen aspirin EC 81 MG tablet Take 81 mg by mouth daily.    . carvedilol (COREG) 25 MG tablet Take 1 tablet (25 mg total) by mouth 2 (two) times daily. 60 tablet 6  . diclofenac sodium (VOLTAREN) 1 % GEL Apply 1 application topically 3 (three) times daily as needed (for hands).    . enalapril (VASOTEC) 20 MG tablet Take 20 mg by mouth 2 (two) times daily.      Marland Kitchen HYDROcodone-homatropine (HYCODAN) 5-1.5 MG/5ML syrup Take 5 mLs by mouth every 6 (six) hours as needed for cough.    Marland Kitchen ibuprofen (ADVIL,MOTRIN) 800 MG tablet Take 800 mg by mouth every 8 (eight) hours as needed for moderate pain.    Marland Kitchen  ipratropium-albuterol (DUONEB) 0.5-2.5 (3) MG/3ML SOLN Take 3 mLs by nebulization every 6 (six) hours as needed (For shortness of breath.).     Marland Kitchen loratadine (CLARITIN) 10 MG tablet Take 10 mg by mouth daily.    Marland Kitchen omeprazole (PRILOSEC) 20 MG capsule Take 20 mg by mouth Daily.     Marland Kitchen oxyCODONE-acetaminophen (PERCOCET/ROXICET) 5-325 MG tablet Take 2 tablets by mouth every 3 (three) hours as needed for severe pain (Q4-6 hours PRN). 30 tablet 0  . simvastatin (ZOCOR) 40 MG tablet Take 40 mg by mouth at bedtime.     . tamsulosin (FLOMAX) 0.4 MG CAPS capsule Take 0.4 mg by mouth 2 (two) times daily.      Results for orders placed or performed during the hospital encounter of 07/19/15 (from the past 48 hour(s))  Basic metabolic panel     Status: Abnormal   Collection Time:  07/19/15 11:20 AM  Result Value Ref Range   Sodium 138 135 - 145 mmol/L   Potassium 3.9 3.5 - 5.1 mmol/L   Chloride 109 101 - 111 mmol/L   CO2 24 22 - 32 mmol/L   Glucose, Bld 106 (H) 65 - 99 mg/dL   BUN 15 6 - 20 mg/dL   Creatinine, Ser 0.88 0.61 - 1.24 mg/dL   Calcium 8.9 8.9 - 10.3 mg/dL   GFR calc non Af Amer >60 >60 mL/min   GFR calc Af Amer >60 >60 mL/min    Comment: (NOTE) The eGFR has been calculated using the CKD EPI equation. This calculation has not been validated in all clinical situations. eGFR's persistently <60 mL/min signify possible Chronic Kidney Disease.    Anion gap 5 5 - 15  CBC     Status: None   Collection Time: 07/19/15 11:20 AM  Result Value Ref Range   WBC 7.7 4.0 - 10.5 K/uL   RBC 5.03 4.22 - 5.81 MIL/uL   Hemoglobin 14.8 13.0 - 17.0 g/dL   HCT 45.3 39.0 - 52.0 %   MCV 90.1 78.0 - 100.0 fL   MCH 29.4 26.0 - 34.0 pg   MCHC 32.7 30.0 - 36.0 g/dL   RDW 14.0 11.5 - 15.5 %   Platelets 175 150 - 400 K/uL   No results found.  Review of Systems  Constitutional: Negative.   HENT: Negative.   Eyes: Negative.   Respiratory: Negative.   Cardiovascular: Negative.   Gastrointestinal: Negative.   Genitourinary: Negative.   Musculoskeletal: Positive for joint pain.  Skin: Negative.   Neurological: Negative.   Endo/Heme/Allergies: Negative.   Psychiatric/Behavioral: Negative.     Blood pressure 131/87, pulse 64, temperature 97.6 F (36.4 C), temperature source Oral, resp. rate 18, height _0  (1.753 m), weight 106.232 kg (234 lb 3.2 oz), SpO2 96 %. Physical Exam  Constitutional: He appears well-developed.  HENT:  Head: Normocephalic.  Eyes: Pupils are equal, round, and reactive to light.  Neck: Normal range of motion.  Cardiovascular: Normal rate.   GI: Soft.  Musculoskeletal:  Pain and swelling in Left Knee.  Neurological: He is alert.  Skin:  Incision over left Shoulder secondary to recent Rotator Cuff Surgery.      Assessment/Plan Arthroscopic Medial meniscectomy of Left Knee.  Tobi Bastos, MD 07/19/2015, 2:32 PM

## 2015-07-19 NOTE — Discharge Instructions (Signed)
Knee Arthroscopy, Care After Refer to this sheet in the next few weeks. These instructions provide you with information about caring for yourself after your procedure. Your health care provider may also give you more specific instructions. Your treatment has been planned according to current medical practices, but problems sometimes occur. Call your health care provider if you have any problems or questions after your procedure. WHAT TO EXPECT AFTER THE PROCEDURE After your procedure, it is common to have:  Soreness.  Pain. HOME CARE INSTRUCTIONS Bathing  Do not take baths, swim, or use a hot tub until your health care provider approves. Incision Care  There are many different ways to close and cover an incision, including stitches, skin glue, and adhesive strips. Follow your health care provider's instructions about:  Incision care.  Bandage (dressing) changes and removal.  Incision closure removal.  Check your incision area every day for signs of infection. Watch for:  Redness, swelling, or pain.  Fluid, blood, or pus. Activity  Avoid strenuous activities for as long as directed by your health care provider.  Return to your normal activities as directed by your health care provider. Ask your health care provider what activities are safe for you.  Perform range-of-motion exercises only as directed by your health care provider.  Do not lift anything that is heavier than 10 lb (4.5 kg).  Do not drive or operate heavy machinery while taking pain medicine.  If you were given crutches, use them as directed by your health care provider. Managing Pain, Stiffness, and Swelling  If directed, apply ice to the injured area:  Put ice in a plastic bag.  Place a towel between your skin and the bag.  Leave the ice on for 20 minutes, 2-3 times per day.  Raise the injured area above the level of your heart while you are sitting or lying down as directed by your health care  provider. General Instructions  Keep all follow-up visits as directed by your health care provider. This is important.  Take medicines only as directed by your health care provider.  Do not use any tobacco products, including cigarettes, chewing tobacco, or electronic cigarettes. If you need help quitting, ask your health care provider.  If you were given compression stockings, wear them as directed by your health care provider. These stockings help prevent blood clots and reduce swelling in your legs. SEEK MEDICAL CARE IF:  You have severe pain with any movement of your knee.  You notice a bad smell coming from the incision or dressing.  You have redness, swelling, or pain at the site of your incision.  You have fluid, blood, or pus coming from your incision. SEEK IMMEDIATE MEDICAL CARE IF:  You develop a rash.  You have a fever.  You have difficulty breathing or have shortness of breath.  You develop pain in your calves or in the back of your knee.  You develop chest pain.  You develop numbness or tingling in your leg or foot.   This information is not intended to replace advice given to you by your health care provider. Make sure you discuss any questions you have with your health care provider.   Document Released: 07/20/2004 Document Revised: 05/17/2014 Document Reviewed: 12/27/2013 Elsevier Interactive Patient Education 2016 [place] original dressing on for two days. Removed dressing on 07/21/15. Shower and apply bandaids over incisions.      General Anesthesia, Adult, Care After Refer to this sheet in the next  few weeks. These instructions provide you with information on caring for yourself after your procedure. Your health care provider may also give you more specific instructions. Your treatment has been planned according to current medical practices, but problems sometimes occur. Call your health care provider if you have any problems or questions  after your procedure. WHAT TO EXPECT AFTER THE PROCEDURE After the procedure, it is typical to experience:  Sleepiness.  Nausea and vomiting. HOME CARE INSTRUCTIONS  For the first 24 hours after general anesthesia:  Have a responsible person with you.  Do not drive a car. If you are alone, do not take public transportation.  Do not drink alcohol.  Do not take medicine that has not been prescribed by your health care provider.  Do not sign important papers or make important decisions.  You may resume a normal diet and activities as directed by your health care provider.  Change bandages (dressings) as directed.  If you have questions or problems that seem related to general anesthesia, call the hospital and ask for the anesthetist or anesthesiologist on call. SEEK MEDICAL CARE IF:  You have nausea and vomiting that continue the day after anesthesia.  You develop a rash. SEEK IMMEDIATE MEDICAL CARE IF:   You have difficulty breathing.  You have chest pain.  You have any allergic problems.   This information is not intended to replace advice given to you by your health care provider. Make sure you discuss any questions you have with your health care provider.   Document Released: 04/08/2000 Document Revised: 01/21/2014 Document Reviewed: 05/01/2011 Elsevier Interactive Patient Education [employer].

## 2015-07-19 NOTE — Anesthesia Postprocedure Evaluation (Signed)
Anesthesia Post Note  Patient: Tony Patrick  Procedure(s) Performed: Procedure(s) (LRB): LEFT ARTHROSCOPY KNEE WITH MEDIAL MENISCECTOMY (Left)  Patient location during evaluation: PACU Anesthesia Type: General Level of consciousness: awake and alert Pain management: pain level controlled Vital Signs Assessment: post-procedure vital signs reviewed and stable Respiratory status: spontaneous breathing, nonlabored ventilation, respiratory function stable and patient connected to nasal cannula oxygen Cardiovascular status: blood pressure returned to baseline and stable Postop Assessment: no signs of nausea or vomiting Anesthetic complications: no    Last Vitals:  Filed Vitals:   07/19/15 1659 07/19/15 1759  BP: 136/92 138/88  Pulse: 65 68  Temp: 36.7 C 36.8 C  Resp: 12 16    Last Pain:  Filed Vitals:   07/19/15 1801  PainSc: 3                  Sonny Poth,Aksh L

## 2015-07-19 NOTE — Interval H&P Note (Signed)
History and Physical Interval Note:  07/19/2015 2:36 PM  Tony Patrick  has presented today for surgery, with the diagnosis of LEFT KNEE MEDIAL MENISCUS TEAR  The various methods of treatment have been discussed with the patient and family. After consideration of risks, benefits and other options for treatment, the patient has consented to  Procedure(s): LEFT ARTHROSCOPY KNEE WITH MEDIAL MENISCECTOMY (Left) as a surgical intervention .  [name] history has been reviewed, patient examined, no change in status, stable for surgery.  I have reviewed the patient's chart and labs.  Questions were answered to the patient's satisfaction.     [name]

## 2015-07-19 NOTE — Brief Op Note (Signed)
07/19/2015  3:43 PM  PATIENT:  Tony Patrick  65 y.o. male  PRE-OPERATIVE DIAGNOSIS:  LEFT KNEE MEDIAL MENISCUS TEAR and Chondromalacia.  POST-OPERATIVE DIAGNOSIS:  LEFT KNEE MEDIAL MENISCUS TEAR and Chondromalacia.  PROCEDURE:  Procedure(s) with comments: LEFT ARTHROSCOPY KNEE WITH MEDIAL MENISCECTOMY (Left) - LMA and Abraison Chondroplasties of Patella and Medial Femoral Condyle.Also Microfracture of Medial Femoral Condyle.  SURGEON:  Surgeon(s) and Role:    * Latanya Maudlin, MD - Primary  :   ASSISTANTS: OR Nurse  ANESTHESIA:   general  EBL:  Total I/O In: 1000 [I.V.:1000] Out: 0   BLOOD ADMINISTERED:none  DRAINS: none   LOCAL MEDICATIONS USED:  MARCAINE 30cc of 0.50% with EPINEPHRINE.    SPECIMEN:  No Specimen  DISPOSITION OF SPECIMEN:  N/A  COUNTS:  YES  TOURNIQUET:  * No tourniquets in log *  DICTATION: .Other Dictation: Dictation Number [id number]  PLAN OF CARE: Discharge to home after PACU  PATIENT DISPOSITION:  PACU - hemodynamically stable.   Delay start of Pharmacological VTE agent (>24hrs) due to surgical blood loss or risk of bleeding: yes

## 2015-07-19 NOTE — Op Note (Signed)
NAMEDHANI, Tony NO.:  [id number]  MEDICAL RECORD NO.:  [id number]  LOCATION:  [facility]                         FACILITY:  [facility]  PHYSICIAN:  Kipp Brood. Laela Deviney, M.D.DATE OF BIRTH:  [birth year]  DATE OF PROCEDURE:  07/19/2015 DATE OF DISCHARGE:  07/19/2015                              OPERATIVE REPORT   SURGEON:  Jori Moll A. Syerra Abdelrahman, M.D.  ASSISTANT:  OR nurse.  PREOPERATIVE DIAGNOSES: 1. Chondromalacia of left knee. 2. Bucket-handle tear of the posterior horn, medial meniscus, left     knee.  POSTOPERATIVE DIAGNOSES: 1. Chondromalacia of left knee. 2. Bucket-handle tear of the posterior horn, medial meniscus, left     knee.  OPERATIONS: 1. Diagnostic arthroscopy, left knee.. 2. Abrasion chondroplasty of the patella, left knee. 3. Synovectomy of suprapatellar pouch, left knee. 4. Partial medial meniscectomy to posterior horn of the medial     meniscus, left knee. 5. Abrasion chondroplasty of the medial femoral condyle, left knee. 6. Microfracture technique of the medial femoral condyle, left knee.  DESCRIPTION OF PROCEDURE:  Under general anesthesia, routine orthopedic prep and draping of the left lower extremity was carried out.  The left lower extremity was placed in a knee holder.  The appropriate time-out was first carried out.  I also marked the appropriate left leg in the holding area.  The small punctate incision was made in suprapatellar pouch.  Inflow cannula was inserted.  Knee was distended with saline. At this time, another small punctate incision was made in the anterolateral joint.  The arthroscope was entered from lateral approach and a complete diagnostic arthroscopy was carried out.  I went up in the suprapatellar pouch.  He had severe chronic synovitis and chondromalacia of the patella.  I introduced the ArthroCare from the medial side and did an abrasion chondroplasty of the patella as well as a synovectomy. I then went down in the  lateral joint.  The lateral meniscus was intact. He had early arthritis in the lateral joint.  The cruciates were intact and over the medial joint where the main problem was he had rather significant chondromalacia of the distal femoral condyle medially.  He had a tear of the posterior horn of the medial meniscus.  I first did a partial medial meniscectomy, then probed the meniscus and remaining part was intact.  At this time, I then did an abrasion chondroplasty of the medial femoral condyle in usual fashion followed by microfracture technique of the medial femoral condyle.  The knee was thoroughly irrigated.  All fluid was removed.  I then closed all three punctate incisions with 3-0 nylon suture.  I injected 30 mL of 0.25% Marcaine with epinephrine at the end of the procedure and a sterile Neosporin bundle dressing was applied.  Preop, in the operating room, he had 2 g of IV Ancef.  Postop, he will be on aspirin 325 mg b.i.d. as an anticoagulant and he will be on Percocet 10/325 one every 4 hours p.r.n. for pain.          ______________________________ Kipp Brood. Gladstone Lighter, M.D.     RAG/MEDQ  D:  07/19/2015  T:  07/19/2015  Job:  [id number]

## 2015-07-19 NOTE — Anesthesia Preprocedure Evaluation (Signed)
Anesthesia Evaluation  Patient identified by MRN, date of birth, ID band Patient awake    Reviewed: Allergy & Precautions, H&P , NPO status , Patient's Chart, lab work & pertinent test results, reviewed documented beta blocker date and time   Airway Mallampati: II  TM Distance: >3 FB Neck ROM: Full    Dental  (+) Dental Advisory Given, Chipped, Caps, Missing Left upper front capped. Right upper front broken off. Left lower front lateral missing:   Pulmonary neg pulmonary ROS, former smoker,    Pulmonary exam normal breath sounds clear to auscultation       Cardiovascular + CAD and + Past MI   Rhythm:Regular Rate:Normal  EF 25%. LBBB   Neuro/Psych negative neurological ROS  negative psych ROS   GI/Hepatic negative GI ROS, Neg liver ROS,   Endo/Other  negative endocrine ROS  Renal/GU negative Renal ROS  negative genitourinary   Musculoskeletal   Abdominal   Peds  Hematology negative hematology ROS (+)   Anesthesia Other Findings   Reproductive/Obstetrics negative OB ROS                             Anesthesia Physical Anesthesia Plan  ASA: IV  Anesthesia Plan: General   Post-op Pain Management:    Induction: Intravenous  Airway Management Planned: LMA  Additional Equipment:   Intra-op Plan:   Post-operative Plan:   Informed Consent: I have reviewed the patients History and Physical, chart, labs and discussed the procedure including the risks, benefits and alternatives for the proposed anesthesia with the patient or authorized representative who has indicated his/her understanding and acceptance.   Dental Advisory Given  Plan Discussed with: CRNA  Anesthesia Plan Comments:         Anesthesia Quick Evaluation

## 2015-08-28 DIAGNOSIS — R972 Elevated prostate specific antigen [PSA]: Secondary | ICD-10-CM | POA: Diagnosis not present

## 2015-09-05 ENCOUNTER — Encounter: Payer: Self-pay | Admitting: Physician Assistant

## 2015-09-05 ENCOUNTER — Encounter (INDEPENDENT_AMBULATORY_CARE_PROVIDER_SITE_OTHER): Payer: Self-pay

## 2015-09-05 ENCOUNTER — Ambulatory Visit (INDEPENDENT_AMBULATORY_CARE_PROVIDER_SITE_OTHER): Payer: Medicare Other | Admitting: Physician Assistant

## 2015-09-05 VITALS — BP 124/84 | HR 66 | Temp 98.1°F | Ht 69.0 in | Wt 243.6 lb

## 2015-09-05 DIAGNOSIS — I2510 Atherosclerotic heart disease of native coronary artery without angina pectoris: Secondary | ICD-10-CM

## 2015-09-05 DIAGNOSIS — I255 Ischemic cardiomyopathy: Secondary | ICD-10-CM | POA: Diagnosis not present

## 2015-09-05 DIAGNOSIS — K219 Gastro-esophageal reflux disease without esophagitis: Secondary | ICD-10-CM

## 2015-09-05 DIAGNOSIS — E785 Hyperlipidemia, unspecified: Secondary | ICD-10-CM

## 2015-09-05 DIAGNOSIS — R059 Cough, unspecified: Secondary | ICD-10-CM

## 2015-09-05 DIAGNOSIS — M75102 Unspecified rotator cuff tear or rupture of left shoulder, not specified as traumatic: Secondary | ICD-10-CM | POA: Diagnosis not present

## 2015-09-05 DIAGNOSIS — R05 Cough: Secondary | ICD-10-CM

## 2015-09-05 MED ORDER — CARVEDILOL 25 MG PO TABS: 25.0000 mg | ORAL_TABLET | Freq: Two times a day (BID) | ORAL | 3 refills | Status: DC

## 2015-09-05 MED ORDER — ASPIRIN EC 325 MG PO TBEC: 325.0000 mg | DELAYED_RELEASE_TABLET | Freq: Every day | ORAL | 0 refills | Status: DC

## 2015-09-05 NOTE — Patient Instructions (Addendum)
Fat and Cholesterol Restricted Diet High levels of fat and cholesterol in your blood may lead to various health problems, such as diseases of the heart, blood vessels, gallbladder, liver, and pancreas. Fats are concentrated sources of energy that come in various forms. Certain types of fat, including saturated fat, may be harmful in excess. Cholesterol is a substance needed by your body in small amounts. Your body makes all the cholesterol it needs. Excess cholesterol comes from the food you eat. When you have high levels of cholesterol and saturated fat in your blood, health problems can develop because the excess fat and cholesterol will gather along the walls of your blood vessels, causing them to narrow. Choosing the right foods will help you control your intake of fat and cholesterol. This will help keep the levels of these substances in your blood within normal limits and reduce your risk of disease. WHAT IS MY PLAN? Your health care provider recommends that you:  Get no more than __________ % of the total calories in your daily diet from fat.  Limit your intake of saturated fat to less than ______% of your total calories each day.  Limit the amount of cholesterol in your diet to less than _________mg per day. WHAT TYPES OF FAT SHOULD I CHOOSE?  Choose healthy fats more often. Choose monounsaturated and polyunsaturated fats, such as olive and canola oil, flaxseeds, walnuts, almonds, and seeds.  Eat more omega-3 fats. Good choices include salmon, mackerel, sardines, tuna, flaxseed oil, and ground flaxseeds. Aim to eat fish at least two times a week.  Limit saturated fats. Saturated fats are primarily found in animal products, such as meats, butter, and cream. Plant sources of saturated fats include palm oil, palm kernel oil, and coconut oil.  Avoid foods with partially hydrogenated oils in them. These contain trans fats. Examples of foods that contain trans fats are stick margarine, some tub  margarines, cookies, crackers, and other baked goods. WHAT GENERAL GUIDELINES DO I NEED TO FOLLOW? These guidelines for healthy eating will help you control your intake of fat and cholesterol:  Check food labels carefully to identify foods with trans fats or high amounts of saturated fat.  Fill one half of your plate with vegetables and green salads.  Fill one fourth of your plate with whole grains. Look for the word "whole" as the first word in the ingredient list.  Fill one fourth of your plate with lean protein foods.  Limit fruit to two servings a day. Choose fruit instead of juice.  Eat more foods that contain soluble fiber. Examples of foods that contain this type of fiber are apples, broccoli, carrots, beans, peas, and barley. Aim to get 20-30 g of fiber per day.  Eat more home-cooked food and less restaurant, buffet, and fast food.  Limit or avoid alcohol.  Limit foods high in starch and sugar.  Limit fried foods.  Cook foods using methods other than frying. Baking, boiling, grilling, and broiling are all great options.  Lose weight if you are overweight. Losing just 5-10% of your initial body weight can help your overall health and prevent diseases such as diabetes and heart disease. WHAT FOODS CAN I EAT? Grains Whole grains, such as whole wheat or whole grain breads, crackers, cereals, and pasta. Unsweetened oatmeal, bulgur, barley, quinoa, or brown rice. Corn or whole wheat flour tortillas. Vegetables Fresh or frozen vegetables (raw, steamed, roasted, or grilled). Green salads. Fruits All fresh, canned (in natural juice), or frozen fruits. Meat and  Other Protein Products Ground beef (85% or leaner), grass-fed beef, or beef trimmed of fat. Skinless chicken or [place]. Ground chicken or [place]. Pork trimmed of fat. All fish and seafood. Eggs. Dried beans, peas, or lentils. Unsalted nuts or seeds. Unsalted canned or dry beans. Dairy Low-fat dairy products, such as skim or  1% milk, 2% or reduced-fat cheeses, low-fat ricotta or cottage cheese, or plain low-fat yogurt. Fats and Oils Tub margarines without trans fats. Light or reduced-fat mayonnaise and salad dressings. Avocado. Olive, canola, sesame, or safflower oils. Natural peanut or almond butter (choose ones without added sugar and oil). The items listed above may not be a complete list of recommended foods or beverages. Contact your dietitian for more options. WHAT FOODS ARE NOT RECOMMENDED? Grains White bread. White pasta. White rice. Cornbread. Bagels, pastries, and croissants. Crackers that contain trans fat. Vegetables White potatoes. Corn. Creamed or fried vegetables. Vegetables in a cheese sauce. Fruits Dried fruits. Canned fruit in light or heavy syrup. Fruit juice. Meat and Other Protein Products Fatty cuts of meat. Ribs, chicken wings, bacon, sausage, bologna, salami, chitterlings, fatback, hot dogs, bratwurst, and packaged luncheon meats. Liver and organ meats. Dairy Whole or 2% milk, cream, half-and-half, and cream cheese. Whole milk cheeses. Whole-fat or sweetened yogurt. Full-fat cheeses. Nondairy creamers and whipped toppings. Processed cheese, cheese spreads, or cheese curds. Sweets and Desserts Corn syrup, sugars, honey, and molasses. Candy. Jam and jelly. Syrup. Sweetened cereals. Cookies, pies, cakes, donuts, muffins, and ice cream. Fats and Oils Butter, stick margarine, lard, shortening, ghee, or bacon fat. Coconut, palm kernel, or palm oils. Beverages Alcohol. Sweetened drinks (such as sodas, lemonade, and fruit drinks or punches). The items listed above may not be a complete list of foods and beverages to avoid. Contact your dietitian for more information.   This information is not intended to replace advice given to you by your health care provider. Make sure you discuss any questions you have with your health care provider.   Document Released: 12/31/2004 Document Revised: 01/21/2014  Document Reviewed: 03/31/2013 Elsevier Interactive Patient Education 2016 [place] hyperlipidemia patient instructions here.

## 2015-09-05 NOTE — Progress Notes (Signed)
BP 124/84 (BP Location: Right Arm, Patient Position: Sitting, Cuff Size: Large)   Pulse 66   Temp 98.1 F (36.7 C) (Oral)   Ht 5\' 9"  (1.753 m)   Wt 243 lb 9.6 oz (110.5 kg)   BMI 35.97 kg/m    Subjective:    Patient ID: Tony Patrick, male    DOB: [birth year], 65 y.o.   MRN: [id number]  HPI: Tony Patrick is a 65 y.o. male presenting on 09/05/2015 for Medication Refill   HPI Patient here to be established as new patient at [facility].  This patient is known to me from [facility].    All medical histories and medications are reviewed.  His rotator cuff is still bothersome, seeing [facility] and doing therapy.  Has still seen urology and elevated PSA and prostatitis has resolved and is continuing care there.  Also sees cardiology and stable with those conditions.   Relevant past medical, surgical, family and social history reviewed and updated as indicated. Interim medical history since our last visit reviewed. Allergies and medications reviewed and updated.  Review of Systems  Constitutional: Negative.  Negative for appetite change and fatigue.  HENT: Negative.   Eyes: Negative.  Negative for pain and visual disturbance.  Respiratory: Positive for cough. Negative for chest tightness, shortness of breath and wheezing.   Cardiovascular: Negative.  Negative for chest pain, palpitations and leg swelling.  Gastrointestinal: Negative.  Negative for abdominal pain, diarrhea, nausea and vomiting.  Endocrine: Negative.   Genitourinary: Negative.   Musculoskeletal: Positive for arthralgias and joint swelling.  Skin: Negative.  Negative for color change and rash.  Neurological: Negative.  Negative for weakness, numbness and headaches.  Psychiatric/Behavioral: Negative.     Per HPI unless specifically indicated above  Social History   Social History  . Marital status: Married    Spouse name: N/A  . Number of children: 1  . Years of education: N/A     Occupational History  . retired    Social History Main Topics  . Smoking status: Former Smoker    Packs/day: 2.00    Years: 32.00    Types: Cigarettes    Quit date: 04/14/1992  . Smokeless tobacco: Never Used  . Alcohol use No  . Drug use: No  . Sexual activity: Not on file   Other Topics Concern  . Not on file   Social History Narrative   Lives with wife   Retired [employer] (71 yrs)   Museum/gallery conservator       Past Surgical History:  Procedure Laterality Date  . CARDIAC CATHETERIZATION    . CARDIAC VALVE REPLACEMENT    . CHEST TUBE INSERTION    . COLECTOMY  1981   benign mass  . COLONOSCOPY WITH PROPOFOL N/A 07/04/2014   Procedure: COLONOSCOPY WITH PROPOFOL;  Surgeon: Gatha Mayer, MD;  Location: [facility];  Service: Endoscopy;  Laterality: N/A;  . KNEE ARTHROSCOPY Left 07/19/2015   Procedure: LEFT ARTHROSCOPY KNEE WITH MEDIAL MENISCECTOMY;  Surgeon: Latanya Maudlin, MD;  Location: [facility];  Service: Orthopedics;  Laterality: Left;  LMA  . LEG SURGERY Left    cyst  . PLEURAL SCARIFICATION  1978  . SHOULDER OPEN ROTATOR CUFF REPAIR Left 05/26/2015   Procedure: LEFT OPEN ACROMINECTOMY AND REPAIR WITH GRAFT AND ANCHOR ROTATOR CUFF TEAR ;  Surgeon: Latanya Maudlin, MD;  Location: [facility];  Service: Orthopedics;  Laterality: Left;  Left shoulder block in holding    Family History  Problem Relation Age of Onset  . Diabetes Mother   . Hypertension Mother   . Cancer Maternal Grandmother     type unknown  . Melanoma Brother   . Cancer Brother     type unknown  . Prostatitis Brother       Medication List       Accurate as of 09/05/15 12:26 PM. Always use your most recent med list.          albuterol 108 (90 Base) MCG/ACT inhaler Commonly known as:  PROVENTIL HFA;VENTOLIN HFA Inhale 2 puffs into the lungs every 6 (six) hours as needed for wheezing or shortness of breath.   aspirin EC 325 MG tablet Take 1 tablet (325 mg total) by mouth daily.   carvedilol 25 MG  tablet Commonly known as:  COREG Take 1 tablet (25 mg total) by mouth 2 (two) times daily.   diclofenac sodium 1 % Gel Commonly known as:  VOLTAREN Apply 1 application topically 3 (three) times daily as needed (for hands).   enalapril 20 MG tablet Commonly known as:  VASOTEC Take 20 mg by mouth 2 (two) times daily.   HYDROMET 5-1.5 MG/5ML syrup Generic drug:  HYDROcodone-homatropine   ibuprofen 800 MG tablet Commonly known as:  ADVIL,MOTRIN   ipratropium-albuterol 0.5-2.5 (3) MG/3ML Soln Commonly known as:  DUONEB Take 3 mLs by nebulization every 6 (six) hours as needed (For shortness of breath.).   loratadine 10 MG tablet Commonly known as:  CLARITIN Take 10 mg by mouth daily.   omeprazole 20 MG capsule Commonly known as:  PRILOSEC Take 20 mg by mouth Daily.   simvastatin 40 MG tablet Commonly known as:  ZOCOR Take 40 mg by mouth at bedtime.   tamsulosin 0.4 MG Caps capsule Commonly known as:  FLOMAX Take 0.4 mg by mouth 2 (two) times daily.          Objective:    BP 124/84 (BP Location: Right Arm, Patient Position: Sitting, Cuff Size: Large)   Pulse 66   Temp 98.1 F (36.7 C) (Oral)   Ht 5\' 9"  (1.753 m)   Wt 243 lb 9.6 oz (110.5 kg)   BMI 35.97 kg/m   Wt Readings from Last 3 Encounters:  09/05/15 243 lb 9.6 oz (110.5 kg)  07/19/15 234 lb 3.2 oz (106.2 kg)  05/26/15 242 lb (109.8 kg)    Physical Exam  Constitutional: He appears well-developed and well-nourished.  HENT:  Head: Normocephalic and atraumatic.  Eyes: Conjunctivae and EOM are normal. Pupils are equal, round, and reactive to light.  Neck: Normal range of motion. Neck supple.  Cardiovascular: Normal rate, regular rhythm and normal heart sounds.   Pulmonary/Chest: Effort normal and breath sounds normal. He has no wheezes.  Abdominal: Soft. Bowel sounds are normal.  Musculoskeletal: Normal range of motion. He exhibits tenderness and deformity.  Left shoulder still decreased ROM  Skin: Skin  is warm and dry.  Nursing note and vitals reviewed.   Results for orders placed or performed during the hospital encounter of [id number]  Basic metabolic panel  Result Value Ref Range   Sodium 138 135 - 145 mmol/L   Potassium 3.9 3.5 - 5.1 mmol/L   Chloride 109 101 - 111 mmol/L   CO2 24 22 - 32 mmol/L   Glucose, Bld 106 (H) 65 - 99 mg/dL   BUN 15 6 - 20 mg/dL   Creatinine, Ser 0.88 0.61 - 1.24 mg/dL   Calcium 8.9 8.9 - 10.3 mg/dL  GFR calc non Af Amer >60 >60 mL/min   GFR calc Af Amer >60 >60 mL/min   Anion gap 5 5 - 15  CBC  Result Value Ref Range   WBC 7.7 4.0 - 10.5 K/uL   RBC 5.03 4.22 - 5.81 MIL/uL   Hemoglobin 14.8 13.0 - 17.0 g/dL   HCT 45.3 39.0 - 52.0 %   MCV 90.1 78.0 - 100.0 fL   MCH 29.4 26.0 - 34.0 pg   MCHC 32.7 30.0 - 36.0 g/dL   RDW 14.0 11.5 - 15.5 %   Platelets 175 150 - 400 K/uL      Assessment & Plan:   1. Coronary artery disease involving native coronary artery of native heart without angina pectoris Continue care with cardiologist - aspirin EC 325 MG tablet; Take 1 tablet (325 mg total) by mouth daily.  Dispense: 30 tablet; Refill: 0 - carvedilol (COREG) 25 MG tablet; Take 1 tablet (25 mg total) by mouth 2 (two) times daily.  Dispense: 180 tablet; Refill: 3 Continue enalapril  2. Gastroesophageal reflux disease without esophagitis Food avoidance Continue omeprazole  3. Tear of rotator cuff, left Continue therapy with orthopedist - ibuprofen (ADVIL,MOTRIN) 800 MG tablet;   4. Dyslipidemia Low fat diet  5. Cough Use albuterol neb treatments to help open lungs during this time - HYDROMET 5-1.5 MG/5ML syrup;     Follow up plan: Return in about 3 months (around 12/06/2015) for recheck and labs.  Terald Sleeper PA-C [facility] [address]  [place], [place] [address] [phone]   09/05/2015, 12:26 PM

## 2015-09-07 ENCOUNTER — Other Ambulatory Visit: Payer: Self-pay | Admitting: Physician Assistant

## 2015-09-07 DIAGNOSIS — M75102 Unspecified rotator cuff tear or rupture of left shoulder, not specified as traumatic: Secondary | ICD-10-CM

## 2015-10-16 ENCOUNTER — Telehealth: Payer: Self-pay | Admitting: Physician Assistant

## 2015-10-16 DIAGNOSIS — R05 Cough: Secondary | ICD-10-CM

## 2015-10-16 DIAGNOSIS — R059 Cough, unspecified: Secondary | ICD-10-CM

## 2015-10-17 MED ORDER — HYDROMET 5-1.5 MG/5ML PO SYRP: 5.0000 mL | ORAL_SOLUTION | ORAL | 0 refills | Status: DC | PRN

## 2015-10-17 NOTE — Telephone Encounter (Signed)
Prescription placed at front desk, patient notified via voicemail on home phone

## 2015-10-17 NOTE — Telephone Encounter (Signed)
prepared and printed

## 2015-10-22 ENCOUNTER — Emergency Department (HOSPITAL_COMMUNITY)
Admission: EM | Admit: 2015-10-22 | Discharge: 2015-10-22 | Disposition: A | Discharge status: Home / Self Care | Payer: Medicare Other | Attending: Emergency Medicine | Admitting: Emergency Medicine

## 2015-10-22 ENCOUNTER — Encounter (HOSPITAL_COMMUNITY): Payer: Self-pay | Admitting: Emergency Medicine

## 2015-10-22 ENCOUNTER — Emergency Department (HOSPITAL_COMMUNITY): Payer: Medicare Other

## 2015-10-22 DIAGNOSIS — Z7982 Long term (current) use of aspirin: Secondary | ICD-10-CM | POA: Diagnosis not present

## 2015-10-22 DIAGNOSIS — Z87891 Personal history of nicotine dependence: Secondary | ICD-10-CM | POA: Diagnosis not present

## 2015-10-22 DIAGNOSIS — I2510 Atherosclerotic heart disease of native coronary artery without angina pectoris: Secondary | ICD-10-CM | POA: Diagnosis not present

## 2015-10-22 DIAGNOSIS — Z79899 Other long term (current) drug therapy: Secondary | ICD-10-CM | POA: Insufficient documentation

## 2015-10-22 DIAGNOSIS — J40 Bronchitis, not specified as acute or chronic: Secondary | ICD-10-CM | POA: Diagnosis not present

## 2015-10-22 DIAGNOSIS — R05 Cough: Secondary | ICD-10-CM | POA: Diagnosis not present

## 2015-10-22 DIAGNOSIS — J069 Acute upper respiratory infection, unspecified: Secondary | ICD-10-CM | POA: Diagnosis not present

## 2015-10-22 DIAGNOSIS — B9789 Other viral agents as the cause of diseases classified elsewhere: Secondary | ICD-10-CM

## 2015-10-22 DIAGNOSIS — I252 Old myocardial infarction: Secondary | ICD-10-CM | POA: Insufficient documentation

## 2015-10-22 HISTORY — DX: Unspecified atherosclerosis: I70.90

## 2015-10-22 MED ORDER — PREDNISONE 50 MG PO TABS
60.0000 mg | ORAL_TABLET | Freq: Once | ORAL | Status: AC
  Administered 2015-10-22: 60 mg via ORAL
  Filled 2015-10-22: qty 1

## 2015-10-22 MED ORDER — ALBUTEROL SULFATE HFA 108 (90 BASE) MCG/ACT IN AERS: 1.0000 | INHALATION_SPRAY | Freq: Four times a day (QID) | RESPIRATORY_TRACT | 0 refills | Status: DC | PRN

## 2015-10-22 MED ORDER — PREDNISONE 20 MG PO TABS: 20.0000 mg | ORAL_TABLET | Freq: Two times a day (BID) | ORAL | 0 refills | Status: DC

## 2015-10-22 MED ORDER — IPRATROPIUM-ALBUTEROL 0.5-2.5 (3) MG/3ML IN SOLN
3.0000 mL | Freq: Once | RESPIRATORY_TRACT | Status: AC
  Administered 2015-10-22: 3 mL via RESPIRATORY_TRACT
  Filled 2015-10-22: qty 3

## 2015-10-22 MED ORDER — ALBUTEROL SULFATE (2.5 MG/3ML) 0.083% IN NEBU: 2.5000 mg | INHALATION_SOLUTION | Freq: Four times a day (QID) | RESPIRATORY_TRACT | 12 refills | Status: DC | PRN

## 2015-10-22 MED ORDER — ALBUTEROL SULFATE (2.5 MG/3ML) 0.083% IN NEBU
2.5000 mg | INHALATION_SOLUTION | Freq: Once | RESPIRATORY_TRACT | Status: AC
  Administered 2015-10-22: 2.5 mg via RESPIRATORY_TRACT
  Filled 2015-10-22: qty 3

## 2015-10-22 MED ORDER — BENZONATATE 100 MG PO CAPS: 100.0000 mg | ORAL_CAPSULE | Freq: Three times a day (TID) | ORAL | 0 refills | Status: DC

## 2015-10-22 MED ORDER — ALBUTEROL (5 MG/ML) CONTINUOUS INHALATION SOLN
10.0000 mg/h | INHALATION_SOLUTION | Freq: Once | RESPIRATORY_TRACT | Status: AC
  Administered 2015-10-22: 10 mg/h via RESPIRATORY_TRACT
  Filled 2015-10-22: qty 20

## 2015-10-22 MED ORDER — BENZONATATE 100 MG PO CAPS
200.0000 mg | ORAL_CAPSULE | Freq: Once | ORAL | Status: AC
  Administered 2015-10-22: 200 mg via ORAL
  Filled 2015-10-22: qty 2

## 2015-10-22 NOTE — ED Provider Notes (Signed)
[facility] DEPT Provider Note   CSN: [id number] Arrival date & time: 10/22/15  1549     History   Chief Complaint Chief Complaint  Patient presents with  . Cough    HPI Soul Companion is a 65 y.o. male. Presents with a complaint of a cough for the last 2 weeks. His given Hydromet cough syrup by his primary care physician. Has an inhaler and nebulizer from of illness from a few years ago that he's been using with minimal relief. She gets peroxisomal cough. No swelling in his feet. No pain in his chest. No fevers. Dry nonproductive cough without sputum production and no hemoptysis. No pain with coughing.  HPI  Past Medical History:  Diagnosis Date  . Arthritis   . Atherosclerotic plaque   . Basal cell carcinoma   . CAD (coronary artery disease)    Anterior MI, [facility], 1995, no PCI, total LAD  / catheterization 2001, 30% LAD beyond the origin of a large diagonal, circumflex normal, RCA normal, anterolateral and apical  akinesis, ejection fraction 25-35% range  . Dyslipidemia   . Dysrhythmia   . Ejection fraction < 50%    EF 25-35%, catheterization 2001 / EF 20-25%, global hypokinesis, echo in June, 2012  . GERD (gastroesophageal reflux disease)   . History of bronchitis   . Hypoxia    Pneumonia, hospitalization, June, 2012  . Kidney stones 1990's  . MI (myocardial infarction) 1995  . Pneumonia 2012  . S/P colectomy    Benign tumor    Patient Active Problem List   Diagnosis Date Noted  . Tear of rotator cuff 05/26/2015  . Shoulder injury 04/06/2015  . Rotator cuff syndrome 04/06/2015  . Special screening for malignant neoplasms, colon   . CAD (coronary artery disease)   . GERD (gastroesophageal reflux disease)   . Dyslipidemia   . Ejection fraction < 50%   . Hypoxia     Past Surgical History:  Procedure Laterality Date  . CARDIAC CATHETERIZATION    . CARDIAC VALVE REPLACEMENT    . CHEST TUBE INSERTION    . COLECTOMY  1981   benign mass  . COLONOSCOPY WITH  PROPOFOL N/A 07/04/2014   Procedure: COLONOSCOPY WITH PROPOFOL;  Surgeon: Gatha Mayer, MD;  [facility];  Service: Endoscopy;  Laterality: N/A;  . KNEE ARTHROSCOPY Left 07/19/2015   Procedure: LEFT ARTHROSCOPY KNEE WITH MEDIAL MENISCECTOMY;  Surgeon: Latanya Maudlin, MD;  [facility];  Service: Orthopedics;  Laterality: Left;  LMA  . LEG SURGERY Left    cyst  . PLEURAL SCARIFICATION  1978  . SHOULDER OPEN ROTATOR CUFF REPAIR Left 05/26/2015   Procedure: LEFT OPEN ACROMINECTOMY AND REPAIR WITH GRAFT AND ANCHOR ROTATOR CUFF TEAR ;  Surgeon: Latanya Maudlin, MD;  [facility];  Service: Orthopedics;  Laterality: Left;  Left shoulder block in holding       Home Medications    Prior to Admission medications   Medication Sig Start Date End Date Taking? Authorizing Provider  aspirin EC 81 MG tablet Take 81 mg by mouth daily.   Yes Historical Provider, MD  carvedilol (COREG) 25 MG tablet Take 1 tablet (25 mg total) by mouth 2 (two) times daily. 09/05/15  Yes Terald Sleeper, PA  diclofenac sodium (VOLTAREN) 1 % GEL Apply 1 application topically 3 (three) times daily as needed (for hands).   Yes Historical Provider, MD  enalapril (VASOTEC) 20 MG tablet Take 30 mg by mouth 2 (two) times daily.  Yes Historical Provider, MD  HYDROMET 5-1.5 MG/5ML syrup Take 5 mLs by mouth every 4 (four) hours as needed for cough. 10/17/15  Yes Terald Sleeper, PA  ibuprofen (ADVIL,MOTRIN) 800 MG tablet TAKE 1 TABLET THREE TIMES A DAY Patient taking differently: TAKE 1 TABLET THREE TIMES A DAY  AS NEEDED FOR PAIN 09/07/15  Yes Terald Sleeper, PA  ipratropium-albuterol (DUONEB) 0.5-2.5 (3) MG/3ML SOLN Take 3 mLs by nebulization every 6 (six) hours as needed (For shortness of breath.).    Yes Historical Provider, MD  loratadine (CLARITIN) 10 MG tablet Take 10 mg by mouth daily. 04/08/15  Yes Historical Provider, MD  omeprazole (PRILOSEC) 20 MG capsule TAKE 1 CAPSULE DAILY 09/07/15  Yes Terald Sleeper, PA    simvastatin (ZOCOR) 40 MG tablet TAKE 1 TABLET EVERY EVENING 09/07/15  Yes Terald Sleeper, PA  tamsulosin (FLOMAX) 0.4 MG CAPS capsule Take 0.4 mg by mouth 2 (two) times daily.   Yes Historical Provider, MD  traMADol (ULTRAM) 50 MG tablet Take 25-50 mg by mouth every 6 (six) hours as needed for moderate pain or severe pain.  10/09/15  Yes Historical Provider, MD  albuterol (PROVENTIL HFA;VENTOLIN HFA) 108 (90 Base) MCG/ACT inhaler Inhale 1-2 puffs into the lungs every 6 (six) hours as needed for wheezing. 10/22/15   Tanna Furry, MD  albuterol (PROVENTIL) (2.5 MG/3ML) 0.083% nebulizer solution Take 3 mLs (2.5 mg total) by nebulization every 6 (six) hours as needed for wheezing or shortness of breath. 10/22/15   Tanna Furry, MD  aspirin EC 325 MG tablet Take 1 tablet (325 mg total) by mouth daily. Patient not taking: Reported on 10/22/2015 09/05/15   Terald Sleeper, PA  benzonatate (TESSALON) 100 MG capsule Take 1 capsule (100 mg total) by mouth every 8 (eight) hours. 10/22/15   Tanna Furry, MD  predniSONE (DELTASONE) 20 MG tablet Take 1 tablet (20 mg total) by mouth 2 (two) times daily with a meal. 10/22/15   Tanna Furry, MD    Family History Family History  Problem Relation Age of Onset  . Diabetes Mother   . Hypertension Mother   . Cancer Maternal Grandmother     type unknown  . Melanoma Brother   . Cancer Brother     type unknown  . Prostatitis Brother     Social History Social History  Substance Use Topics  . Smoking status: Former Smoker    Packs/day: 2.00    Years: 32.00    Types: Cigarettes    Quit date: 04/14/1992  . Smokeless tobacco: Never Used  . Alcohol use No     Allergies   Ivp dye [iodinated diagnostic agents] and Morphine and related   Review of Systems Review of Systems  Constitutional: Negative for appetite change, chills, diaphoresis, fatigue and fever.  HENT: Negative for mouth sores, sore throat and trouble swallowing.   Eyes: Negative for visual disturbance.   Respiratory: Positive for cough, shortness of breath and wheezing. Negative for chest tightness.   Cardiovascular: Negative for chest pain.  Gastrointestinal: Negative for abdominal distention, abdominal pain, diarrhea, nausea and vomiting.  Endocrine: Negative for polydipsia, polyphagia and polyuria.  Genitourinary: Negative for dysuria, frequency and hematuria.  Musculoskeletal: Negative for gait problem.  Skin: Negative for color change, pallor and rash.  Neurological: Negative for dizziness, syncope, light-headedness and headaches.  Hematological: Does not bruise/bleed easily.  Psychiatric/Behavioral: Negative for behavioral problems and confusion.     Physical Exam Updated Vital Signs BP 146/83 (BP Location:  Left Arm)   Pulse 69   Temp 97.5 F (36.4 C) (Oral)   Resp 17   Ht 5\' 9"  (1.753 m)   Wt 240 lb (108.9 kg)   SpO2 97%   BMI 35.44 kg/m   Physical Exam  Constitutional: He is oriented to person, place, and time. He appears well-developed and well-nourished. No distress.  HENT:  Head: Normocephalic.  Eyes: Conjunctivae are normal. Pupils are equal, round, and reactive to light. No scleral icterus.  Neck: Normal range of motion. Neck supple. No thyromegaly present.  Cardiovascular: Normal rate and regular rhythm.  Exam reveals no gallop and no friction rub.   No murmur heard. Pulmonary/Chest: Effort normal and breath sounds normal. No respiratory distress. He has no wheezes. He has no rales.  Frequent dry cough. Mild prolongation. Not tachycardic or tachypneic. O2 sats 96% on room air. No dependent edema.  Abdominal: Soft. Bowel sounds are normal. He exhibits no distension. There is no tenderness. There is no rebound.  Musculoskeletal: Normal range of motion.  Neurological: He is alert and oriented to person, place, and time.  Skin: Skin is warm and dry. No rash noted.  Psychiatric: He has a normal mood and affect. His behavior is normal.     ED Treatments / Results   Labs (all labs ordered are listed, but only abnormal results are displayed) Labs Reviewed - No data to display  EKG  EKG Interpretation None       Radiology Dg Chest 2 View  Result Date: 10/22/2015 CLINICAL DATA:  Cough and wheezing for 2 weeks. EXAM: CHEST  2 VIEW COMPARISON:  02/21/2009 FINDINGS: The heart size and mediastinal contours are within normal limits. Both lungs are clear. No pleural effusion or pneumothorax. The visualized skeletal structures are unremarkable. IMPRESSION: No active cardiopulmonary disease. Electronically Signed   By: Lajean Manes M.D.   On: 10/22/2015 16:45    Procedures Procedures (including critical care time)  Medications Ordered in ED Medications  ipratropium-albuterol (DUONEB) 0.5-2.5 (3) MG/3ML nebulizer solution 3 mL (3 mLs Nebulization Given 10/22/15 1615)  albuterol (PROVENTIL) (2.5 MG/3ML) 0.083% nebulizer solution 2.5 mg (2.5 mg Nebulization Given 10/22/15 1617)  predniSONE (DELTASONE) tablet 60 mg (60 mg Oral Given 10/22/15 1629)  albuterol (PROVENTIL,VENTOLIN) solution continuous neb (10 mg/hr Nebulization Given 10/22/15 1657)  benzonatate (TESSALON) capsule 200 mg (200 mg Oral Given 10/22/15 1629)     Initial Impression / Assessment and Plan / ED Course  I have reviewed the triage vital signs and the nursing notes.  Pertinent labs & imaging results that were available during my care of the patient were reviewed by me and considered in my medical decision making (see chart for details).  Clinical Course    Chest x-ray shows no new abnormalities. Fluid no infiltrates. Given IV lisinopril here. Given Tessalon. He hasn't by mouth prednisone. Symptoms improved. Recheck his cough has lessened but not resolved. Plan is discharge home. No indication for antibiotics. Doubt cardiac. No symptoms or findings to suggest CHF. Likely viral. Plan prednisone albuterol, Tessalon continue his home Hydromet cough vacation. Primary care follow-up.  Final  Clinical Impressions(s) / ED Diagnoses   Final diagnoses:  Viral URI with cough  Bronchitis    New Prescriptions New Prescriptions   ALBUTEROL (PROVENTIL HFA;VENTOLIN HFA) 108 (90 BASE) MCG/ACT INHALER    Inhale 1-2 puffs into the lungs every 6 (six) hours as needed for wheezing.   ALBUTEROL (PROVENTIL) (2.5 MG/3ML) 0.083% NEBULIZER SOLUTION    Take 3 mLs (2.5  mg total) by nebulization every 6 (six) hours as needed for wheezing or shortness of breath.   BENZONATATE (TESSALON) 100 MG CAPSULE    Take 1 capsule (100 mg total) by mouth every 8 (eight) hours.   PREDNISONE (DELTASONE) 20 MG TABLET    Take 1 tablet (20 mg total) by mouth 2 (two) times daily with a meal.     Tanna Furry, MD 10/22/15 [phone]

## 2015-10-22 NOTE — ED Notes (Signed)
Continuos neb completed. RT notifed

## 2015-10-22 NOTE — ED Triage Notes (Signed)
Patient c/o cough with wheezing x2 weeks. Denies any fevers. Per patient given prescription for Hydromet cough syrup with no relief. Per patient also using inhaler with little relief. Patient states that he has neb treatments at home but that they are out of date. Denies any chest pain. Shortness of breath with excretion and "coughing spells".

## 2015-11-16 ENCOUNTER — Ambulatory Visit (INDEPENDENT_AMBULATORY_CARE_PROVIDER_SITE_OTHER): Payer: Medicare Other | Admitting: Family

## 2015-11-16 ENCOUNTER — Encounter: Payer: Self-pay | Admitting: Family

## 2015-11-16 VITALS — BP 135/80 | HR 95 | Temp 97.1°F | Ht 69.0 in | Wt 243.0 lb

## 2015-11-16 DIAGNOSIS — I255 Ischemic cardiomyopathy: Secondary | ICD-10-CM | POA: Diagnosis not present

## 2015-11-16 DIAGNOSIS — J208 Acute bronchitis due to other specified organisms: Secondary | ICD-10-CM

## 2015-11-16 MED ORDER — PREDNISONE 10 MG (21) PO TBPK: ORAL_TABLET | ORAL | 0 refills | Status: DC

## 2015-11-16 MED ORDER — FLUTICASONE PROPIONATE 50 MCG/ACT NA SUSP: 2.0000 | Freq: Every day | NASAL | 6 refills | Status: DC

## 2015-11-16 MED ORDER — BENZONATATE 100 MG PO CAPS: 100.0000 mg | ORAL_CAPSULE | Freq: Three times a day (TID) | ORAL | 1 refills | Status: DC

## 2015-11-16 MED ORDER — BENZONATATE 100 MG PO CAPS: 100.0000 mg | ORAL_CAPSULE | Freq: Three times a day (TID) | ORAL | 0 refills | Status: DC

## 2015-11-16 MED ORDER — CETIRIZINE HCL 10 MG PO TABS: 10.0000 mg | ORAL_TABLET | Freq: Every day | ORAL | 2 refills | Status: DC

## 2015-11-16 NOTE — Progress Notes (Signed)
   Subjective:    Patient ID: Tony Patrick, male    DOB: [birth year], 65 y.o.   MRN: [id number]  Cough  This is a new problem. The current episode started in the past 7 days. The problem has been gradually worsening. The problem occurs every few minutes. The cough is non-productive. Associated symptoms include nasal congestion, shortness of breath and wheezing. Pertinent negatives include no chills, ear congestion, ear pain, fever, headaches or sore throat. The symptoms are aggravated by lying down. He has tried rest and OTC cough suppressant for the symptoms. The treatment provided mild relief. There is no history of asthma or COPD.      Review of Systems  Constitutional: Negative for chills and fever.  HENT: Negative for ear pain and sore throat.   Respiratory: Positive for cough, shortness of breath and wheezing.   Neurological: Negative for headaches.  All other systems reviewed and are negative.      Objective:   Physical Exam  Constitutional: He is oriented to person, place, and time. He appears well-developed and well-nourished. No distress.  HENT:  Head: Normocephalic.  Right Ear: External ear normal.  Left Ear: External ear normal.  Nasal passage erythemas with mild swelling Oropharynx erythemas  Cardiovascular: Normal rate, regular rhythm, normal heart sounds and intact distal pulses.   No murmur heard. Pulmonary/Chest: Effort normal and breath sounds normal. No respiratory distress. He has no wheezes.  Abdominal: Soft. Bowel sounds are normal. He exhibits no distension. There is no tenderness.  Musculoskeletal: Normal range of motion. He exhibits no edema or tenderness.  Neurological: He is alert and oriented to person, place, and time.  Skin: Skin is warm and dry. No rash noted. No erythema.  Psychiatric: He has a normal mood and affect. His behavior is normal. Judgment and thought content normal.  Vitals reviewed.     BP 135/80   Pulse 95   Temp 97.1 F (36.2  C) (Oral)   Ht 5\' 9"  (1.753 m)   Wt 243 lb (110.2 kg)   BMI 35.88 kg/m      Assessment & Plan:  1. Viral bronchitis -- Take meds as prescribed - Use a cool mist humidifier  -Use saline nose sprays frequently -Saline irrigations of the nose can be very helpful if done frequently.  * 4X daily for 1 week*  * Use of a nettie pot can be helpful with this. Follow directions with this* -Force fluids -For any cough or congestion  Use plain Mucinex- regular strength or max strength is fine   * Children- consult with Pharmacist for dosing -For fever or aces or pains- take tylenol or ibuprofen appropriate for age and weight.  * for fevers greater than 101 orally you may alternate ibuprofen and tylenol every  3 hours. -Throat lozenges if help - predniSONE (STERAPRED UNI-PAK 21 TAB) 10 MG (21) TBPK tablet; Use as directed  Dispense: 21 tablet; Refill: 0 - fluticasone (FLONASE) 50 MCG/ACT nasal spray; Place 2 sprays into both nostrils daily.  Dispense: 16 g; Refill: 6 - benzonatate (TESSALON) 100 MG capsule; Take 1 capsule (100 mg total) by mouth every 8 (eight) hours.  Dispense: 60 capsule; Refill: [place], FNP

## 2015-11-16 NOTE — Patient Instructions (Signed)

## 2015-11-27 DIAGNOSIS — R972 Elevated prostate specific antigen [PSA]: Secondary | ICD-10-CM | POA: Diagnosis not present

## 2015-12-04 DIAGNOSIS — N138 Other obstructive and reflux uropathy: Secondary | ICD-10-CM | POA: Diagnosis not present

## 2015-12-04 DIAGNOSIS — R350 Frequency of micturition: Secondary | ICD-10-CM | POA: Diagnosis not present

## 2015-12-04 DIAGNOSIS — R972 Elevated prostate specific antigen [PSA]: Secondary | ICD-10-CM | POA: Diagnosis not present

## 2015-12-14 ENCOUNTER — Ambulatory Visit (INDEPENDENT_AMBULATORY_CARE_PROVIDER_SITE_OTHER): Payer: Medicare Other

## 2015-12-14 DIAGNOSIS — Z23 Encounter for immunization: Secondary | ICD-10-CM | POA: Diagnosis not present

## 2015-12-27 DIAGNOSIS — H2513 Age-related nuclear cataract, bilateral: Secondary | ICD-10-CM | POA: Diagnosis not present

## 2016-01-21 ENCOUNTER — Other Ambulatory Visit: Payer: Self-pay | Admitting: Physician Assistant

## 2016-03-04 ENCOUNTER — Ambulatory Visit (INDEPENDENT_AMBULATORY_CARE_PROVIDER_SITE_OTHER): Payer: Medicare Other | Admitting: Pediatrics

## 2016-03-04 ENCOUNTER — Encounter: Payer: Self-pay | Admitting: Pediatrics

## 2016-03-04 VITALS — BP 130/81 | HR 76 | Temp 97.4°F | Ht 69.0 in | Wt 248.0 lb

## 2016-03-04 DIAGNOSIS — J45909 Unspecified asthma, uncomplicated: Secondary | ICD-10-CM | POA: Diagnosis not present

## 2016-03-04 DIAGNOSIS — R05 Cough: Secondary | ICD-10-CM

## 2016-03-04 DIAGNOSIS — R059 Cough, unspecified: Secondary | ICD-10-CM

## 2016-03-04 DIAGNOSIS — J069 Acute upper respiratory infection, unspecified: Secondary | ICD-10-CM

## 2016-03-04 MED ORDER — HYDROMET 5-1.5 MG/5ML PO SYRP: 5.0000 mL | ORAL_SOLUTION | ORAL | 0 refills | Status: DC | PRN

## 2016-03-04 MED ORDER — ALBUTEROL SULFATE HFA 108 (90 BASE) MCG/ACT IN AERS: 1.0000 | INHALATION_SPRAY | Freq: Four times a day (QID) | RESPIRATORY_TRACT | 0 refills | Status: DC | PRN

## 2016-03-04 NOTE — Progress Notes (Signed)
  Subjective:   Patient ID: Reinhardt Hillis, male    DOB: 10/12/1950, 66 y.o.   MRN: [id number] CC: Nasal Congestion (cough, HA, fever yesterday)  HPI: Tyson Pickron is a 66 y.o. male presenting for Nasal Congestion (cough, HA, fever yesterday)  Better today Three days ago started sneezing, coughing, lots of congestion Low grade temp Used inhaler this morning, also albuterol nebulizer Felt unable to move air at first Improved with albuteorl treatment Uses flonase every night  Relevant past medical, surgical, family and social history reviewed. Allergies and medications reviewed and updated. History  Smoking Status  . Former Smoker  . Packs/day: 2.00  . Years: 32.00  . Types: Cigarettes  . Quit date: 04/14/1992  Smokeless Tobacco  . Never Used   ROS: Per HPI   Objective:    BP 130/81 (BP Location: Right Arm)   Pulse 76   Temp 97.4 F (36.3 C) (Oral)   Ht 5\' 9"  (1.753 m)   Wt 248 lb (112.5 kg)   BMI 36.62 kg/m   Wt Readings from Last 3 Encounters:  03/04/16 248 lb (112.5 kg)  11/16/15 243 lb (110.2 kg)  10/22/15 240 lb (108.9 kg)    Gen: NAD, alert, cooperative with exam, NCAT EYES: EOMI, no conjunctival injection, or no icterus ENT:  TMs pearly gray b/l, OP without erythema LYMPH: no cervical LAD CV: NRRR, normal S1/S2, no murmur, distal pulses 2+ b/l Resp: CTABL, slight bronchial sound to breath sounds, no wheezes or crackles, normal WOB Ext: No edema, warm Neuro: Alert and oriented  Assessment & Plan:  Drelan was seen today for nasal congestion.  Diagnoses and all orders for this visit:  Uncomplicated asthma, unspecified asthma severity, unspecified whether persistent Use albuterol TID while sick -     albuterol (PROVENTIL HFA;VENTOLIN HFA) 108 (90 Base) MCG/ACT inhaler; Inhale 1-2 puffs into the lungs every 6 (six) hours as needed for wheezing.  Cough -     HYDROMET 5-1.5 MG/5ML syrup; Take 5 mLs by mouth every 4 (four) hours as needed for cough.  Acute  URI Feeling better today Increase albuteorl use as above Nl lung exam discussed return precautions  Follow up plan: prn Assunta Found, MD [place]

## 2016-04-11 DIAGNOSIS — R310 Gross hematuria: Secondary | ICD-10-CM | POA: Diagnosis not present

## 2016-04-11 DIAGNOSIS — R972 Elevated prostate specific antigen [PSA]: Secondary | ICD-10-CM | POA: Diagnosis not present

## 2016-04-22 DIAGNOSIS — R310 Gross hematuria: Secondary | ICD-10-CM | POA: Diagnosis not present

## 2016-04-22 DIAGNOSIS — N200 Calculus of kidney: Secondary | ICD-10-CM | POA: Diagnosis not present

## 2016-04-23 ENCOUNTER — Other Ambulatory Visit: Payer: Self-pay | Admitting: Family

## 2016-05-06 DIAGNOSIS — R310 Gross hematuria: Secondary | ICD-10-CM | POA: Diagnosis not present

## 2016-05-06 DIAGNOSIS — N200 Calculus of kidney: Secondary | ICD-10-CM | POA: Diagnosis not present

## 2016-05-06 DIAGNOSIS — R972 Elevated prostate specific antigen [PSA]: Secondary | ICD-10-CM | POA: Diagnosis not present

## 2016-05-06 DIAGNOSIS — N138 Other obstructive and reflux uropathy: Secondary | ICD-10-CM | POA: Diagnosis not present

## 2016-05-06 DIAGNOSIS — N281 Cyst of kidney, acquired: Secondary | ICD-10-CM | POA: Diagnosis not present

## 2016-05-14 ENCOUNTER — Ambulatory Visit (INDEPENDENT_AMBULATORY_CARE_PROVIDER_SITE_OTHER): Payer: Medicare Other | Admitting: Physician Assistant

## 2016-05-14 ENCOUNTER — Encounter: Payer: Self-pay | Admitting: Physician Assistant

## 2016-05-14 VITALS — Temp 97.5°F | Ht 69.0 in | Wt 243.0 lb

## 2016-05-14 DIAGNOSIS — R5383 Other fatigue: Secondary | ICD-10-CM | POA: Diagnosis not present

## 2016-05-14 DIAGNOSIS — I2510 Atherosclerotic heart disease of native coronary artery without angina pectoris: Secondary | ICD-10-CM

## 2016-05-14 DIAGNOSIS — Z0000 Encounter for general adult medical examination without abnormal findings: Secondary | ICD-10-CM

## 2016-05-14 DIAGNOSIS — N401 Enlarged prostate with lower urinary tract symptoms: Secondary | ICD-10-CM

## 2016-05-14 DIAGNOSIS — R350 Frequency of micturition: Secondary | ICD-10-CM

## 2016-05-14 DIAGNOSIS — E785 Hyperlipidemia, unspecified: Secondary | ICD-10-CM | POA: Diagnosis not present

## 2016-05-14 DIAGNOSIS — J208 Acute bronchitis due to other specified organisms: Secondary | ICD-10-CM | POA: Diagnosis not present

## 2016-05-14 MED ORDER — FLUTICASONE PROPIONATE 50 MCG/ACT NA SUSP: 2.0000 | Freq: Every day | NASAL | 11 refills | Status: DC

## 2016-05-14 NOTE — Progress Notes (Signed)
Temp 97.5 F (36.4 C) (Oral)   Ht _0  (1.753 m)   Wt 243 lb (110.2 kg)   BMI 35.88 kg/m    Subjective:    Patient ID: Tony Patrick, male    DOB: Jul 08, 1950, 66 y.o.   MRN: [id number]  HPI: Tony Patrick is a 66 y.o. male presenting on 05/14/2016 for Follow-up and Medication Refill  This patient comes in for periodic recheck on medications and conditions including arthritis, CAD, hyperlipid, HTN, BPH.   All medications are reviewed today. There are no reports of any problems with the medications. All of the medical conditions are reviewed and updated.  Lab work is reviewed and will be ordered as medically necessary. There are no new problems reported with today's visit.   Relevant past medical, surgical, family and social history reviewed and updated as indicated. Allergies and medications reviewed and updated.  Past Medical History:  Diagnosis Date  . Arthritis   . Atherosclerotic plaque   . Basal cell carcinoma   . CAD (coronary artery disease)    Anterior MI, [facility], 1995, no PCI, total LAD  / catheterization 2001, 30% LAD beyond the origin of a large diagonal, circumflex normal, RCA normal, anterolateral and apical  akinesis, ejection fraction 25-35% range  . Dyslipidemia   . Dysrhythmia   . Ejection fraction < 50%    EF 25-35%, catheterization 2001 / EF 20-25%, global hypokinesis, echo in June, 2012  . GERD (gastroesophageal reflux disease)   . History of bronchitis   . Hypoxia    Pneumonia, hospitalization, June, 2012  . Kidney stones 1990's  . MI (myocardial infarction) ([place]) 1995  . Pneumonia 2012  . S/P colectomy    Benign tumor    Past Surgical History:  Procedure Laterality Date  . CARDIAC CATHETERIZATION    . CARDIAC VALVE REPLACEMENT    . CHEST TUBE INSERTION    . COLECTOMY  1981   benign mass  . COLONOSCOPY WITH PROPOFOL N/A 07/04/2014   Procedure: COLONOSCOPY WITH PROPOFOL;  Surgeon: Gatha Mayer, MD;  Location: [facility];  Service: Endoscopy;   Laterality: N/A;  . KNEE ARTHROSCOPY Left 07/19/2015   Procedure: LEFT ARTHROSCOPY KNEE WITH MEDIAL MENISCECTOMY;  Surgeon: Latanya Maudlin, MD;  Location: [facility];  Service: Orthopedics;  Laterality: Left;  LMA  . LEG SURGERY Left    cyst  . PLEURAL SCARIFICATION  1978  . SHOULDER OPEN ROTATOR CUFF REPAIR Left 05/26/2015   Procedure: LEFT OPEN ACROMINECTOMY AND REPAIR WITH GRAFT AND ANCHOR ROTATOR CUFF TEAR ;  Surgeon: Latanya Maudlin, MD;  Location: [facility];  Service: Orthopedics;  Laterality: Left;  Left shoulder block in holding    Review of Systems  Constitutional: Negative.  Negative for appetite change and fatigue.  HENT: Negative.   Eyes: Negative.  Negative for pain and visual disturbance.  Respiratory: Negative.  Negative for cough, chest tightness, shortness of breath and wheezing.   Cardiovascular: Negative.  Negative for chest pain, palpitations and leg swelling.  Gastrointestinal: Negative.  Negative for abdominal pain, diarrhea, nausea and vomiting.  Endocrine: Negative.   Genitourinary: Negative.   Musculoskeletal: Positive for arthralgias, gait problem and joint swelling.  Skin: Negative.  Negative for color change and rash.  Neurological: Negative for weakness, numbness and headaches.  Psychiatric/Behavioral: Negative.     Allergies as of 05/14/2016      Reactions   Ivp Dye [iodinated Diagnostic Agents] Swelling, Other (See Comments)   Arrest   Morphine And Related Nausea And  Vomiting      Medication List       Accurate as of 05/14/16 11:59 PM. Always use your most recent med list.          albuterol (2.5 MG/3ML) 0.083% nebulizer solution Commonly known as:  PROVENTIL Take 3 mLs (2.5 mg total) by nebulization every 6 (six) hours as needed for wheezing or shortness of breath.   albuterol 108 (90 Base) MCG/ACT inhaler Commonly known as:  PROVENTIL HFA;VENTOLIN HFA Inhale 1-2 puffs into the lungs every 6 (six) hours as needed for wheezing.   aspirin EC 81 MG  tablet Take 81 mg by mouth daily.   carvedilol 25 MG tablet Commonly known as:  COREG Take 1 tablet (25 mg total) by mouth 2 (two) times daily.   cetirizine 10 MG tablet Commonly known as:  ZYRTEC Take 1 tablet (10 mg total) by mouth daily.   diclofenac sodium 1 % Gel Commonly known as:  VOLTAREN Apply 1 application topically 3 (three) times daily as needed (for hands).   enalapril 20 MG tablet Commonly known as:  VASOTEC TAKE ONE AND ONE-HALF TABLETS TWICE A DAY   finasteride 5 MG tablet Commonly known as:  PROSCAR   fluticasone 50 MCG/ACT nasal spray Commonly known as:  FLONASE Place 2 sprays into both nostrils daily.   ibuprofen 800 MG tablet Commonly known as:  ADVIL,MOTRIN TAKE 1 TABLET THREE TIMES A DAY   ipratropium-albuterol 0.5-2.5 (3) MG/3ML Soln Commonly known as:  DUONEB Take 3 mLs by nebulization every 6 (six) hours as needed (For shortness of breath.).   omeprazole 20 MG capsule Commonly known as:  PRILOSEC TAKE 1 CAPSULE DAILY   simvastatin 40 MG tablet Commonly known as:  ZOCOR TAKE 1 TABLET EVERY EVENING   tamsulosin 0.4 MG Caps capsule Commonly known as:  FLOMAX Take 0.4 mg by mouth daily.          Objective:    Temp 97.5 F (36.4 C) (Oral)   Ht _0  (1.753 m)   Wt 243 lb (110.2 kg)   BMI 35.88 kg/m   Allergies  Allergen Reactions  . Ivp Dye [Iodinated Diagnostic Agents] Swelling and Other (See Comments)    Arrest  . Morphine And Related Nausea And Vomiting    Physical Exam  Constitutional: He appears well-developed and well-nourished.  HENT:  Head: Normocephalic and atraumatic.  Eyes: Conjunctivae and EOM are normal. Pupils are equal, round, and reactive to light.  Neck: Normal range of motion. Neck supple.  Cardiovascular: Normal rate, regular rhythm and normal heart sounds.   Pulmonary/Chest: Effort normal and breath sounds normal.  Abdominal: Soft. Bowel sounds are normal.  Musculoskeletal:       Left knee: He exhibits  decreased range of motion and swelling. He exhibits no erythema. Tenderness found. Medial joint line tenderness noted.  Skin: Skin is warm and dry.  Nursing note and vitals reviewed.       Assessment & Plan:   1. Well adult exam - CBC with Differential/Platelet; Future - CMP14+EGFR; Future - Lipid panel; Future  2. Viral bronchitis - fluticasone (FLONASE) 50 MCG/ACT nasal spray; Place 2 sprays into both nostrils daily.  Dispense: 16 g; Refill: 11  3. Coronary artery disease involving native coronary artery of native heart without angina pectoris - CBC with Differential/Platelet; Future - CMP14+EGFR; Future - Lipid panel; Future  4. Dyslipidemia - CMP14+EGFR; Future - Lipid panel; Future  5. Fatigue, unspecified type - CBC with Differential/Platelet; Future  6. Benign  prostatic hyperplasia with urinary frequency - finasteride (PROSCAR) 5 MG tablet;    Current Outpatient Prescriptions:  .  albuterol (PROVENTIL HFA;VENTOLIN HFA) 108 (90 Base) MCG/ACT inhaler, Inhale 1-2 puffs into the lungs every 6 (six) hours as needed for wheezing., Disp: 1 Inhaler, Rfl: 0 .  albuterol (PROVENTIL) (2.5 MG/3ML) 0.083% nebulizer solution, Take 3 mLs (2.5 mg total) by nebulization every 6 (six) hours as needed for wheezing or shortness of breath., Disp: 75 mL, Rfl: 12 .  aspirin EC 81 MG tablet, Take 81 mg by mouth daily., Disp: , Rfl:  .  carvedilol (COREG) 25 MG tablet, Take 1 tablet (25 mg total) by mouth 2 (two) times daily., Disp: 180 tablet, Rfl: 3 .  cetirizine (ZYRTEC) 10 MG tablet, Take 1 tablet (10 mg total) by mouth daily., Disp: 90 tablet, Rfl: 2 .  diclofenac sodium (VOLTAREN) 1 % GEL, Apply 1 application topically 3 (three) times daily as needed (for hands)., Disp: , Rfl:  .  enalapril (VASOTEC) 20 MG tablet, TAKE ONE AND ONE-HALF TABLETS TWICE A DAY, Disp: 270 tablet, Rfl: 0 .  finasteride (PROSCAR) 5 MG tablet, , Disp: , Rfl:  .  fluticasone (FLONASE) 50 MCG/ACT nasal spray,  Place 2 sprays into both nostrils daily., Disp: 16 g, Rfl: 11 .  ibuprofen (ADVIL,MOTRIN) 800 MG tablet, TAKE 1 TABLET THREE TIMES A DAY (Patient taking differently: TAKE 1 TABLET THREE TIMES A DAY  AS NEEDED FOR PAIN), Disp: 270 tablet, Rfl: 3 .  ipratropium-albuterol (DUONEB) 0.5-2.5 (3) MG/3ML SOLN, Take 3 mLs by nebulization every 6 (six) hours as needed (For shortness of breath.). , Disp: , Rfl:  .  omeprazole (PRILOSEC) 20 MG capsule, TAKE 1 CAPSULE DAILY, Disp: 90 capsule, Rfl: 2 .  simvastatin (ZOCOR) 40 MG tablet, TAKE 1 TABLET EVERY EVENING, Disp: 90 tablet, Rfl: 2 .  tamsulosin (FLOMAX) 0.4 MG CAPS capsule, Take 0.4 mg by mouth daily. , Disp: , Rfl:   Continue all other maintenance medications as listed above.  Follow up plan: Recheck 6 months  Educational handout given for arthritis  Terald Sleeper PA-C [facility] [address]  [place], [place] [address] [phone]   05/16/2016, 5:53 PM

## 2016-05-15 ENCOUNTER — Ambulatory Visit: Payer: Medicare Other | Admitting: Cardiology

## 2016-05-16 DIAGNOSIS — R350 Frequency of micturition: Secondary | ICD-10-CM

## 2016-05-16 DIAGNOSIS — N401 Enlarged prostate with lower urinary tract symptoms: Secondary | ICD-10-CM | POA: Insufficient documentation

## 2016-05-16 NOTE — Patient Instructions (Signed)
Arthritis Arthritis means joint pain. It can also mean joint disease. A joint is a place where bones come together. People who have arthritis may have:  Red joints.  Swollen joints.  Stiff joints.  Warm joints.  A fever.  A feeling of being sick. Follow these instructions at home: Pay attention to any changes in your symptoms. Take these actions to help with your pain and swelling. Medicines   Take over-the-counter and prescription medicines only as told by your doctor.  Do not take aspirin for pain if your doctor says that you may have gout. Activity   Rest your joint if your doctor tells you to.  Avoid activities that make the pain worse.  Exercise your joint regularly as told by your doctor. Try doing exercises like:  Swimming.  Water aerobics.  Biking.  Walking. Joint Care    If your joint is swollen, keep it raised (elevated) if told by your doctor.  If your joint feels stiff in the morning, try taking a warm shower.  If you have diabetes, do not apply heat without asking your doctor.  If told, apply heat to the joint:  Put a towel between the joint and the hot pack or heating pad.  Leave the heat on the area for 20-30 minutes.  If told, apply ice to the joint:  Put ice in a plastic bag.  Place a towel between your skin and the bag.  Leave the ice on for 20 minutes, 2-3 times per day.  Keep all follow-up visits as told by your doctor. Contact a doctor if:  The pain gets worse.  You have a fever. Get help right away if:  You have very bad pain in your joint.  You have swelling in your joint.  Your joint is red.  Many joints become painful and swollen.  You have very bad back pain.  Your leg is very weak.  You cannot control your pee (urine) or poop (stool). This information is not intended to replace advice given to you by your health care provider. Make sure you discuss any questions you have with your health care  provider. Document Released: 03/27/2009 Document Revised: 06/08/2015 Document Reviewed: 03/28/2014 Else[employer] Interactive Patient Education  2017 [employer]  

## 2016-05-20 ENCOUNTER — Other Ambulatory Visit: Payer: Medicare Other

## 2016-05-20 DIAGNOSIS — R5383 Other fatigue: Secondary | ICD-10-CM | POA: Diagnosis not present

## 2016-05-20 DIAGNOSIS — Z0000 Encounter for general adult medical examination without abnormal findings: Secondary | ICD-10-CM | POA: Diagnosis not present

## 2016-05-20 DIAGNOSIS — I2510 Atherosclerotic heart disease of native coronary artery without angina pectoris: Secondary | ICD-10-CM

## 2016-05-20 DIAGNOSIS — E785 Hyperlipidemia, unspecified: Secondary | ICD-10-CM | POA: Diagnosis not present

## 2016-05-21 LAB — CBC WITH DIFFERENTIAL/PLATELET
Basophils Absolute: 0 10*3/uL (ref 0.0–0.2)
Basos: 0 %
EOS (ABSOLUTE): 0.2 10*3/uL (ref 0.0–0.4)
Eos: 3 %
HEMOGLOBIN: 14.8 g/dL (ref 13.0–17.7)
Hematocrit: 46.1 % (ref 37.5–51.0)
IMMATURE GRANS (ABS): 0 10*3/uL (ref 0.0–0.1)
IMMATURE GRANULOCYTES: 0 %
LYMPHS: 21 %
Lymphocytes Absolute: 1.5 10*3/uL (ref 0.7–3.1)
MCH: 29.8 pg (ref 26.6–33.0)
MCHC: 32.1 g/dL (ref 31.5–35.7)
MCV: 93 fL (ref 79–97)
MONOCYTES: 8 %
Monocytes Absolute: 0.6 10*3/uL (ref 0.1–0.9)
NEUTROS ABS: 4.7 10*3/uL (ref 1.4–7.0)
NEUTROS PCT: 68 %
PLATELETS: 193 10*3/uL (ref 150–379)
RBC: 4.97 x10E6/uL (ref 4.14–5.80)
RDW: 14.4 % (ref 12.3–15.4)
WBC: 7 10*3/uL (ref 3.4–10.8)

## 2016-05-21 LAB — LIPID PANEL
CHOLESTEROL TOTAL: 140 mg/dL (ref 100–199)
Chol/HDL Ratio: 3.5 ratio (ref 0.0–5.0)
HDL: 40 mg/dL (ref >39)
LDL Calculated: 77 mg/dL (ref 0–99)
TRIGLYCERIDES: 116 mg/dL (ref 0–149)
VLDL CHOLESTEROL CAL: 23 mg/dL (ref 5–40)

## 2016-05-21 LAB — CMP14+EGFR
A/G RATIO: 1.8 (ref 1.2–2.2)
ALT: 19 IU/L (ref 0–44)
AST: 18 IU/L (ref 0–40)
Albumin: 4.1 g/dL (ref 3.6–4.8)
Alkaline Phosphatase: 63 IU/L (ref 39–117)
BILIRUBIN TOTAL: 0.7 mg/dL (ref 0.0–1.2)
BUN/Creatinine Ratio: 19 (ref 10–24)
BUN: 18 mg/dL (ref 8–27)
CHLORIDE: 105 mmol/L (ref 96–106)
CO2: 21 mmol/L (ref 18–29)
Calcium: 9.2 mg/dL (ref 8.6–10.2)
Creatinine, Ser: 0.97 mg/dL (ref 0.76–1.27)
GFR calc non Af Amer: 80 mL/min/{1.73_m2} (ref >59)
GFR, EST AFRICAN AMERICAN: 93 mL/min/{1.73_m2} (ref >59)
GLUCOSE: 98 mg/dL (ref 65–99)
Globulin, Total: 2.3 g/dL (ref 1.5–4.5)
POTASSIUM: 4.2 mmol/L (ref 3.5–5.2)
Sodium: 145 mmol/L — ABNORMAL HIGH (ref 134–144)
TOTAL PROTEIN: 6.4 g/dL (ref 6.0–8.5)

## 2016-05-22 ENCOUNTER — Encounter: Payer: Self-pay | Admitting: Cardiology

## 2016-05-22 ENCOUNTER — Ambulatory Visit (INDEPENDENT_AMBULATORY_CARE_PROVIDER_SITE_OTHER): Payer: Medicare Other | Admitting: Cardiology

## 2016-05-22 VITALS — BP 152/96 | HR 65 | Ht 69.0 in | Wt 244.0 lb

## 2016-05-22 DIAGNOSIS — I2510 Atherosclerotic heart disease of native coronary artery without angina pectoris: Secondary | ICD-10-CM

## 2016-05-22 DIAGNOSIS — E785 Hyperlipidemia, unspecified: Secondary | ICD-10-CM

## 2016-05-22 DIAGNOSIS — I255 Ischemic cardiomyopathy: Secondary | ICD-10-CM

## 2016-05-22 DIAGNOSIS — I252 Old myocardial infarction: Secondary | ICD-10-CM | POA: Insufficient documentation

## 2016-05-22 DIAGNOSIS — I420 Dilated cardiomyopathy: Secondary | ICD-10-CM | POA: Diagnosis not present

## 2016-05-22 NOTE — Progress Notes (Signed)
Cardiology Office Note:    Date:  05/22/2016   ID:  Tony Patrick, DOB 01/29/1950, MRN [id number]  PCP:  Terald Sleeper, PA-C  Cardiologist:  Candee Furbish, MD    Referring MD: Terald Sleeper, PA-C   Chief Complaint  Patient presents with  . Follow-up  Here for follow-up history of ischemic cardiomyopathy  History of Present Illness:    Tony Patrick is a 66 y.o. male with a hx of Coronary artery disease status post anterior myocardial infarction in 1995 with Subsequent cardiac catheterization in 2001, see below with ejection fraction of 25% at that time, currently 30-35%, did not wish to proceed with ICD here for follow-up.  Multiple discussions about ICD, not interested. He states that his first heart attack was in 1995 and he has lived a long life since then.  Back in 2012 nuclear stress test was recommended but he did not wish to have this done.  Last time seeing him he was here for preoperative risk assessment prior to left shoulder surgery.  His left knee has been bothering as well. He's been getting gel shots. He denies any chest pain, shortness of breath, syncope, bleeding. No orthopnea. No heart failure-like symptoms. He is still working with the Therapist, nutritional periodically. His orthopedic issues predominate.  Past Medical History:  Diagnosis Date  . Arthritis   . Atherosclerotic plaque   . Basal cell carcinoma   . CAD (coronary artery disease)    Anterior MI, [facility], 1995, no PCI, total LAD  / catheterization 2001, 30% LAD beyond the origin of a large diagonal, circumflex normal, RCA normal, anterolateral and apical  akinesis, ejection fraction 25-35% range  . Dyslipidemia   . Dysrhythmia   . Ejection fraction < 50%    EF 25-35%, catheterization 2001 / EF 20-25%, global hypokinesis, echo in June, 2012  . GERD (gastroesophageal reflux disease)   . History of bronchitis   . Hypoxia    Pneumonia, hospitalization, June, 2012  . Kidney stones 1990's  . MI  (myocardial infarction) ([place]) 1995  . Pneumonia 2012  . S/P colectomy    Benign tumor    Past Surgical History:  Procedure Laterality Date  . CARDIAC CATHETERIZATION    . CARDIAC VALVE REPLACEMENT    . CHEST TUBE INSERTION    . COLECTOMY  1981   benign mass  . COLONOSCOPY WITH PROPOFOL N/A 07/04/2014   Procedure: COLONOSCOPY WITH PROPOFOL;  Surgeon: Gatha Mayer, MD;  Location: [facility];  Service: Endoscopy;  Laterality: N/A;  . KNEE ARTHROSCOPY Left 07/19/2015   Procedure: LEFT ARTHROSCOPY KNEE WITH MEDIAL MENISCECTOMY;  Surgeon: Latanya Maudlin, MD;  Location: [facility];  Service: Orthopedics;  Laterality: Left;  LMA  . LEG SURGERY Left    cyst  . PLEURAL SCARIFICATION  1978  . SHOULDER OPEN ROTATOR CUFF REPAIR Left 05/26/2015   Procedure: LEFT OPEN ACROMINECTOMY AND REPAIR WITH GRAFT AND ANCHOR ROTATOR CUFF TEAR ;  Surgeon: Latanya Maudlin, MD;  Location: [facility];  Service: Orthopedics;  Laterality: Left;  Left shoulder block in holding    Current Medications: Current Meds  Medication Sig  . albuterol (PROVENTIL HFA;VENTOLIN HFA) 108 (90 Base) MCG/ACT inhaler Inhale 1-2 puffs into the lungs every 6 (six) hours as needed for wheezing.  Marland Kitchen albuterol (PROVENTIL) (2.5 MG/3ML) 0.083% nebulizer solution Take 3 mLs (2.5 mg total) by nebulization every 6 (six) hours as needed for wheezing or shortness of breath.  Marland Kitchen aspirin EC 81 MG tablet Take 81  mg by mouth daily.  . carvedilol (COREG) 25 MG tablet Take 1 tablet (25 mg total) by mouth 2 (two) times daily.  . cetirizine (ZYRTEC) 10 MG tablet Take 1 tablet (10 mg total) by mouth daily.  . diclofenac sodium (VOLTAREN) 1 % GEL Apply 1 application topically 3 (three) times daily as needed (for hands).  . enalapril (VASOTEC) 20 MG tablet Take 1.5 tablets by mouth daily.  . finasteride (PROSCAR) 5 MG tablet Take 5 mg by mouth daily.   . fluticasone (FLONASE) 50 MCG/ACT nasal spray Place 2 sprays into both nostrils daily.  Marland Kitchen HYDROMET 5-1.5 MG/5ML  syrup Take 5 mLs by mouth every 6 (six) hours as needed for cough.  Marland Kitchen ibuprofen (ADVIL,MOTRIN) 800 MG tablet Take 1 tablet by mouth 3 (three) times daily as needed for pain.  Marland Kitchen ipratropium-albuterol (DUONEB) 0.5-2.5 (3) MG/3ML SOLN Take 3 mLs by nebulization every 6 (six) hours as needed (shortness of breath).  Marland Kitchen omeprazole (PRILOSEC) 20 MG capsule Take 20 mg by mouth daily.  . simvastatin (ZOCOR) 40 MG tablet Take 40 mg by mouth daily.  . tamsulosin (FLOMAX) 0.4 MG CAPS capsule Take 0.4 mg by mouth daily.      Allergies:   Ivp dye [iodinated diagnostic agents] and Morphine and related   Social History   Social History  . Marital status: Married    Spouse name: N/A  . Number of children: 1  . Years of education: N/A   Occupational History  . retired    Social History Main Topics  . Smoking status: Former Smoker    Packs/day: 2.00    Years: 32.00    Types: Cigarettes    Quit date: 04/14/1992  . Smokeless tobacco: Never Used  . Alcohol use No  . Drug use: No  . Sexual activity: Not Asked   Other Topics Concern  . None   Social History Narrative   Lives with wife   Retired [place] [employer] (73 yrs)   Museum/gallery conservator        Family History: [name] family history includes Cancer in his brother and maternal grandmother; Diabetes in his mother; Hypertension in his mother; Melanoma in his brother; Prostatitis in his brother. ROS:   Please see the history of present illness.     All other systems reviewed and are negative.  EKGs/Labs/Other Studies Reviewed:    The following studies were reviewed today: ECHO 05/25/15: - Left ventricle: The cavity size was severely dilated. Wall   thickness was increased in a pattern of mild LVH. Systolic   function was moderately to severely reduced. The estimated   ejection fraction was in the range of 30% to 35%. Predominant   anterior, apical and inferoapical akinesis suggestive of LAD   infarct. Doppler parameters are consistent with  abnormal left   ventricular relaxation (grade 1 diastolic dysfunction). The E/e&'   ratio is >15, suggesting elevated LV filling pressure. - Left atrium: The atrium was mildly dilated. - Right ventricle: The cavity size was mildly dilated. Systolic   function was normal. - Inferior vena cava: The vessel was normal in size. The   respirophasic diameter changes were in the normal range (= 50%),   consistent with normal central venous pressure.  Impressions:  - Compared to a prior study in 2012, the LVEF has improved to   30-35% with predominantly LAD territory wall motion   abnormalities.  EKG:  EKG is  ordered today.  The ekg ordered today demonstrates 05/22/16 shows  sinus rhythm 65 with left bundle branch block like appearance, nonspecific ST-T wave changes personally viewed. No significant change from prior personally viewed.  Recent Labs: 07/19/2015: Hemoglobin 14.8 05/20/2016: ALT 19; BUN 18; Creatinine, Ser 0.97; Platelets 193; Potassium 4.2; Sodium 145   Recent Lipid Panel    Component Value Date/Time   CHOL 140 05/20/2016 1029   TRIG 116 05/20/2016 1029   HDL 40 05/20/2016 1029   CHOLHDL 3.5 05/20/2016 1029   LDLCALC 77 05/20/2016 1029    Physical Exam:    VS:  BP (!) 152/96   Pulse 65   Ht 5\' 9"  (1.753 m)   Wt 244 lb (110.7 kg)   BMI 36.03 kg/m     Wt Readings from Last 3 Encounters:  05/22/16 244 lb (110.7 kg)  05/14/16 243 lb (110.2 kg)  03/04/16 248 lb (112.5 kg)     GEN:  Well nourished, well developed in no acute distress HEENT: Normal NECK: No JVD; No carotid bruits LYMPHATICS: No lymphadenopathy CARDIAC: RRR, no murmurs, rubs, gallops RESPIRATORY:  Clear to auscultation without rales, wheezing or rhonchi  ABDOMEN: Soft, non-tender, non-distended MUSCULOSKELETAL:  No edema; No deformity Obesity SKIN: Warm and dry NEUROLOGIC:  Alert and oriented x 3 PSYCHIATRIC:  Normal affect   ASSESSMENT:    1. Coronary artery disease involving native coronary  artery of native heart without angina pectoris   2. Dyslipidemia   3. Cardiomyopathy, ischemic   4. Dilated cardiomyopathy ([place])   5. History of MI (myocardial infarction)    PLAN:    In order of problems listed above:  Coronary artery disease  - Old anterior MI many years ago in 1995, no PCI, total LAD occlusion noted on 2001 heart catheterization and 30% LAD beyond the origin of a large diagonal branch, circumflex was normal, RCA was normal. EF in the 30-35% range.  - Continue with secondary prevention, goal-directed therapy, beta blocker, ACE inhibitor, statin, aspirin.  Essential hypertension  - His blood pressure was 152/96 today but he hadn't taken his blood pressure medications this morning. His wife thinks that it has been going up recently.  Ischemic cardiomyopathy  - Not interested in ICD. Many discussions. Understands risk of sudden death. He states I have lived this long since my heart attack in 1995.  Morbid Obesity  - Continue to encourage weight loss  - We discussed at length today. Wife present. She has type 2 diabetes.  - BMI greater than 35 with multiple comorbidities.  Hyperlipidemia  - Continue statin therapy.   Medication Adjustments/Labs and Tests Ordered: Current medicines are reviewed at length with the patient today.  Concerns regarding medicines are outlined above. Labs and tests ordered and medication changes are outlined in the patient instructions below:  Patient Instructions  Medication Instructions:  The current medical regimen is effective;  continue present plan and medications.  Follow-Up: Follow up in 1 year with Dr. Marlou Porch.  You will receive a letter in the mail 2 months before you are due.  Please call us when you receive this letter to schedule your follow up appointment.  If you need a refill on your cardiac medications before your next appointment, please call your pharmacy.  Thank you for choosing [facility]!!         Signed, Candee Furbish, MD  05/22/2016 9:51 AM    [place]

## 2016-05-22 NOTE — Patient Instructions (Signed)

## 2016-06-03 ENCOUNTER — Other Ambulatory Visit: Payer: Self-pay | Admitting: Physician Assistant

## 2016-07-23 ENCOUNTER — Other Ambulatory Visit: Payer: Self-pay | Admitting: Physician Assistant

## 2016-07-26 ENCOUNTER — Other Ambulatory Visit: Payer: Self-pay | Admitting: Family

## 2016-08-07 DIAGNOSIS — M5137 Other intervertebral disc degeneration, lumbosacral region: Secondary | ICD-10-CM | POA: Diagnosis not present

## 2016-08-07 DIAGNOSIS — M9905 Segmental and somatic dysfunction of pelvic region: Secondary | ICD-10-CM | POA: Diagnosis not present

## 2016-08-08 DIAGNOSIS — M9905 Segmental and somatic dysfunction of pelvic region: Secondary | ICD-10-CM | POA: Diagnosis not present

## 2016-08-08 DIAGNOSIS — M5137 Other intervertebral disc degeneration, lumbosacral region: Secondary | ICD-10-CM | POA: Diagnosis not present

## 2016-08-12 DIAGNOSIS — M9905 Segmental and somatic dysfunction of pelvic region: Secondary | ICD-10-CM | POA: Diagnosis not present

## 2016-08-12 DIAGNOSIS — M5137 Other intervertebral disc degeneration, lumbosacral region: Secondary | ICD-10-CM | POA: Diagnosis not present

## 2016-08-14 DIAGNOSIS — M5137 Other intervertebral disc degeneration, lumbosacral region: Secondary | ICD-10-CM | POA: Diagnosis not present

## 2016-08-14 DIAGNOSIS — M9905 Segmental and somatic dysfunction of pelvic region: Secondary | ICD-10-CM | POA: Diagnosis not present

## 2016-08-15 DIAGNOSIS — M5137 Other intervertebral disc degeneration, lumbosacral region: Secondary | ICD-10-CM | POA: Diagnosis not present

## 2016-08-15 DIAGNOSIS — M9905 Segmental and somatic dysfunction of pelvic region: Secondary | ICD-10-CM | POA: Diagnosis not present

## 2016-08-19 DIAGNOSIS — M5137 Other intervertebral disc degeneration, lumbosacral region: Secondary | ICD-10-CM | POA: Diagnosis not present

## 2016-08-19 DIAGNOSIS — M9905 Segmental and somatic dysfunction of pelvic region: Secondary | ICD-10-CM | POA: Diagnosis not present

## 2016-08-21 DIAGNOSIS — M5137 Other intervertebral disc degeneration, lumbosacral region: Secondary | ICD-10-CM | POA: Diagnosis not present

## 2016-08-21 DIAGNOSIS — M9905 Segmental and somatic dysfunction of pelvic region: Secondary | ICD-10-CM | POA: Diagnosis not present

## 2016-08-22 DIAGNOSIS — M5137 Other intervertebral disc degeneration, lumbosacral region: Secondary | ICD-10-CM | POA: Diagnosis not present

## 2016-08-22 DIAGNOSIS — M9905 Segmental and somatic dysfunction of pelvic region: Secondary | ICD-10-CM | POA: Diagnosis not present

## 2016-08-26 DIAGNOSIS — M9905 Segmental and somatic dysfunction of pelvic region: Secondary | ICD-10-CM | POA: Diagnosis not present

## 2016-08-26 DIAGNOSIS — M5137 Other intervertebral disc degeneration, lumbosacral region: Secondary | ICD-10-CM | POA: Diagnosis not present

## 2016-08-28 DIAGNOSIS — M5137 Other intervertebral disc degeneration, lumbosacral region: Secondary | ICD-10-CM | POA: Diagnosis not present

## 2016-08-28 DIAGNOSIS — M9905 Segmental and somatic dysfunction of pelvic region: Secondary | ICD-10-CM | POA: Diagnosis not present

## 2016-09-01 ENCOUNTER — Other Ambulatory Visit: Payer: Self-pay | Admitting: Physician Assistant

## 2016-09-01 DIAGNOSIS — I2510 Atherosclerotic heart disease of native coronary artery without angina pectoris: Secondary | ICD-10-CM

## 2016-10-22 ENCOUNTER — Other Ambulatory Visit: Payer: Self-pay | Admitting: Physician Assistant

## 2016-10-22 NOTE — Telephone Encounter (Signed)
Last seen 05/14/16  Tony Patrick

## 2016-10-27 ENCOUNTER — Other Ambulatory Visit: Payer: Self-pay | Admitting: Physician Assistant

## 2016-10-28 DIAGNOSIS — R972 Elevated prostate specific antigen [PSA]: Secondary | ICD-10-CM | POA: Diagnosis not present

## 2016-11-21 ENCOUNTER — Encounter: Payer: Self-pay | Admitting: Physician Assistant

## 2016-11-21 ENCOUNTER — Ambulatory Visit (INDEPENDENT_AMBULATORY_CARE_PROVIDER_SITE_OTHER): Payer: Medicare Other | Admitting: Physician Assistant

## 2016-11-21 VITALS — BP 125/80 | HR 72 | Temp 97.4°F | Ht 69.0 in | Wt 240.0 lb

## 2016-11-21 DIAGNOSIS — Z23 Encounter for immunization: Secondary | ICD-10-CM | POA: Diagnosis not present

## 2016-11-21 DIAGNOSIS — M7661 Achilles tendinitis, right leg: Secondary | ICD-10-CM

## 2016-11-21 NOTE — Progress Notes (Signed)
BP 125/80   Pulse 72   Temp (!) 97.4 F (36.3 C) (Oral)   Ht 5\' 9"  (1.753 m)   Wt 240 lb (108.9 kg)   BMI 35.44 kg/m    Subjective:    Patient ID: Tony Patrick, male    DOB: [birth year], 66 y.o.   MRN: [id number]  HPI: Tony Patrick is a 66 y.o. male presenting on 11/21/2016 for Foot Pain (right )  This patient has a known degenerative joint disease of the left knee that has been told that he needs replacement.  Because of this he is having a very abnormal gait and now causing his right Achilles to hurt tremendously.  He tries to wear boots most of the time that are supportive.  He noticed that there is swelling.  It hurts tremendously when he puts his foot down on the ground.  He is seen by orthopedics for his knee.  I have encouraged him to go back and talk to them about a new plan for the knee.  He is also given a sports medicine exercises for the Achilles tendinitis  Relevant past medical, surgical, family and social history reviewed and updated as indicated. Allergies and medications reviewed and updated.  Past Medical History:  Diagnosis Date  . Arthritis   . Atherosclerotic plaque   . Basal cell carcinoma   . CAD (coronary artery disease)    Anterior MI, [facility], 1995, no PCI, total LAD  / catheterization 2001, 30% LAD beyond the origin of a large diagonal, circumflex normal, RCA normal, anterolateral and apical  akinesis, ejection fraction 25-35% range  . Dyslipidemia   . Dysrhythmia   . Ejection fraction < 50%    EF 25-35%, catheterization 2001 / EF 20-25%, global hypokinesis, echo in June, 2012  . GERD (gastroesophageal reflux disease)   . History of bronchitis   . Hypoxia    Pneumonia, hospitalization, June, 2012  . Kidney stones 1990's  . MI (myocardial infarction) ([place]) 1995  . Pneumonia 2012  . S/P colectomy    Benign tumor    Past Surgical History:  Procedure Laterality Date  . CARDIAC CATHETERIZATION    . CARDIAC VALVE REPLACEMENT    . CHEST TUBE  INSERTION    . COLECTOMY  1981   benign mass  . LEG SURGERY Left    cyst  . PLEURAL SCARIFICATION  1978    Review of Systems  Constitutional: Negative.  Negative for appetite change and fatigue.  Eyes: Negative for pain and visual disturbance.  Respiratory: Negative.  Negative for cough, chest tightness, shortness of breath and wheezing.   Cardiovascular: Negative.  Negative for chest pain, palpitations and leg swelling.  Gastrointestinal: Negative.  Negative for abdominal pain, diarrhea, nausea and vomiting.  Genitourinary: Negative.   Skin: Negative.  Negative for color change and rash.  Neurological: Negative.  Negative for weakness, numbness and headaches.  Psychiatric/Behavioral: Negative.     Allergies as of 11/21/2016      Reactions   Ivp Dye [iodinated Diagnostic Agents] Swelling, Other (See Comments)   Arrest   Morphine And Related Nausea And Vomiting      Medication List        Accurate as of 11/21/16  3:45 PM. Always use your most recent med list.          albuterol (2.5 MG/3ML) 0.083% nebulizer solution Commonly known as:  PROVENTIL Take 3 mLs (2.5 mg total) by nebulization every 6 (six) hours as needed for wheezing or shortness  of breath.   albuterol 108 (90 Base) MCG/ACT inhaler Commonly known as:  PROVENTIL HFA;VENTOLIN HFA Inhale 1-2 puffs into the lungs every 6 (six) hours as needed for wheezing.   aspirin EC 81 MG tablet Take 81 mg by mouth daily.   carvedilol 25 MG tablet Commonly known as:  COREG TAKE 1 TABLET TWICE A DAY   cetirizine 10 MG tablet Commonly known as:  ZYRTEC TAKE 1 TABLET DAILY   diclofenac sodium 1 % Gel Commonly known as:  VOLTAREN Apply 1 application topically 3 (three) times daily as needed (for hands).   enalapril 20 MG tablet Commonly known as:  VASOTEC TAKE ONE AND ONE-HALF TABLETS TWICE A DAY   finasteride 5 MG tablet Commonly known as:  PROSCAR Take 5 mg by mouth daily.   fluticasone 50 MCG/ACT nasal  spray Commonly known as:  FLONASE Place 2 sprays into both nostrils daily.   HYDROMET 5-1.5 MG/5ML syrup Generic drug:  HYDROcodone-homatropine Take 5 mLs by mouth every 6 (six) hours as needed for cough.   ibuprofen 800 MG tablet Commonly known as:  ADVIL,MOTRIN TAKE 1 TABLET THREE TIMES A DAY   ipratropium-albuterol 0.5-2.5 (3) MG/3ML Soln Commonly known as:  DUONEB Take 3 mLs by nebulization every 6 (six) hours as needed (shortness of breath).   omeprazole 20 MG capsule Commonly known as:  PRILOSEC TAKE 1 CAPSULE DAILY   simvastatin 40 MG tablet Commonly known as:  ZOCOR TAKE 1 TABLET EVERY EVENING   tamsulosin 0.4 MG Caps capsule Commonly known as:  FLOMAX Take 0.4 mg by mouth daily.          Objective:    BP 125/80   Pulse 72   Temp (!) 97.4 F (36.3 C) (Oral)   Ht 5\' 9"  (1.753 m)   Wt 240 lb (108.9 kg)   BMI 35.44 kg/m   Allergies  Allergen Reactions  . Ivp Dye [Iodinated Diagnostic Agents] Swelling and Other (See Comments)    Arrest  . Morphine And Related Nausea And Vomiting    Physical Exam  Constitutional: He appears well-developed and well-nourished. No distress.  HENT:  Head: Normocephalic and atraumatic.  Eyes: Conjunctivae and EOM are normal. Pupils are equal, round, and reactive to light.  Cardiovascular: Normal rate, regular rhythm and normal heart sounds.  Pulmonary/Chest: Effort normal and breath sounds normal. No respiratory distress.  Musculoskeletal:       Right ankle: He exhibits decreased range of motion and swelling. Tenderness. Achilles tendon exhibits pain and defect. Achilles tendon exhibits normal Thompson's test results.       Feet:  Skin: Skin is warm and dry.  Psychiatric: He has a normal mood and affect. His behavior is normal.  Nursing note and vitals reviewed.       Assessment & Plan:   1. Achilles tendinitis of right lower extremity Ice several times a day Wear supportive shoe with a high ankle such as his work  boots to help brace it Ibuprofen 803 times a day for a week Consider proceeding with left knee procedure in order to normalize gait  2. Encounter for immunization - Flu vaccine HIGH DOSE PF    Current Outpatient Medications:  .  albuterol (PROVENTIL HFA;VENTOLIN HFA) 108 (90 Base) MCG/ACT inhaler, Inhale 1-2 puffs into the lungs every 6 (six) hours as needed for wheezing., Disp: 1 Inhaler, Rfl: 0 .  albuterol (PROVENTIL) (2.5 MG/3ML) 0.083% nebulizer solution, Take 3 mLs (2.5 mg total) by nebulization every 6 (six) hours as  needed for wheezing or shortness of breath., Disp: 75 mL, Rfl: 12 .  aspirin EC 81 MG tablet, Take 81 mg by mouth daily., Disp: , Rfl:  .  carvedilol (COREG) 25 MG tablet, TAKE 1 TABLET TWICE A DAY, Disp: 180 tablet, Rfl: 3 .  cetirizine (ZYRTEC) 10 MG tablet, TAKE 1 TABLET DAILY, Disp: 90 tablet, Rfl: 0 .  diclofenac sodium (VOLTAREN) 1 % GEL, Apply 1 application topically 3 (three) times daily as needed (for hands)., Disp: , Rfl:  .  enalapril (VASOTEC) 20 MG tablet, TAKE ONE AND ONE-HALF TABLETS TWICE A DAY, Disp: 270 tablet, Rfl: 0 .  finasteride (PROSCAR) 5 MG tablet, Take 5 mg by mouth daily. , Disp: , Rfl:  .  fluticasone (FLONASE) 50 MCG/ACT nasal spray, Place 2 sprays into both nostrils daily., Disp: 16 g, Rfl: 11 .  HYDROMET 5-1.5 MG/5ML syrup, Take 5 mLs by mouth every 6 (six) hours as needed for cough., Disp: , Rfl:  .  ibuprofen (ADVIL,MOTRIN) 800 MG tablet, TAKE 1 TABLET THREE TIMES A DAY, Disp: 270 tablet, Rfl: 3 .  ipratropium-albuterol (DUONEB) 0.5-2.5 (3) MG/3ML SOLN, Take 3 mLs by nebulization every 6 (six) hours as needed (shortness of breath)., Disp: , Rfl:  .  omeprazole (PRILOSEC) 20 MG capsule, TAKE 1 CAPSULE DAILY, Disp: 90 capsule, Rfl: 1 .  simvastatin (ZOCOR) 40 MG tablet, TAKE 1 TABLET EVERY EVENING, Disp: 90 tablet, Rfl: 1 .  tamsulosin (FLOMAX) 0.4 MG CAPS capsule, Take 0.4 mg by mouth daily. , Disp: , Rfl:  Continue all other maintenance  medications as listed above.  Follow up plan: Return in about 6 months (around 05/21/2017) for recheck.  Educational handout given for achilles exercises  Terald Sleeper PA-C [place] [address]  [place], [place] [address] [phone]   11/21/2016, 3:45 PM

## 2016-11-21 NOTE — Patient Instructions (Signed)

## 2016-11-25 DIAGNOSIS — R972 Elevated prostate specific antigen [PSA]: Secondary | ICD-10-CM | POA: Diagnosis not present

## 2016-12-01 ENCOUNTER — Other Ambulatory Visit: Payer: Self-pay | Admitting: Physician Assistant

## 2016-12-19 DIAGNOSIS — R972 Elevated prostate specific antigen [PSA]: Secondary | ICD-10-CM | POA: Diagnosis not present

## 2017-01-03 ENCOUNTER — Telehealth: Payer: Self-pay | Admitting: *Deleted

## 2017-01-03 NOTE — Telephone Encounter (Signed)
   [facility] Pre-operative Risk Assessment    Request for surgical clearance:  1. What type of surgery is being performed? Left Knee: TKA-medial & LATERAL W/WO PATELLA RESURFACING   2. When is this surgery scheduled?  Pending Upon Clearance   3. Are there any medications that need to be held prior to surgery and how long?   N/A  4. Practice name and name of physician performing surgery? [facility] Dr. Latanya Maudlin   5. What is your office phone and fax number?  Phone: [phone]         Fax: [phone] Attn: Fabio Asa  6. Anesthesia type (None, local, MAC, general) ? None   Lataria Courser M 01/03/2017, 3:15 PM  _________________________________________________________________   (provider comments below)

## 2017-01-08 NOTE — Telephone Encounter (Signed)
Faxed to requesting office. 

## 2017-01-08 NOTE — Telephone Encounter (Signed)
   Primary Cardiologist: No primary care provider on file.  Chart reviewed as part of pre-operative protocol coverage. Patient was contacted 01/08/2017 in reference to pre-operative risk assessment for pending surgery as outlined below.  Tony Patrick was last seen on 05/22/2016 by Dr. Marlou Porch.  Since that day, Tony Patrick has done well, able to complete at least 4 METS without any issue. RCRI Class III risk, 6.6% risk of major cardiac event.   Therefore, based on ACC/AHA guidelines, the patient would be at acceptable risk for the planned procedure without further cardiovascular testing.   I will route this recommendation to the requesting party via Epic fax function and remove from pre-op pool.  Please call with questions. Will also forward to Dr. Marlou Porch as well as he previously recommend stress test several years ago and patient was not interested.   [place], [place] 01/08/2017, 4:08 PM

## 2017-01-09 NOTE — Telephone Encounter (Signed)
He may proceed with surgery. Agree. No need for further testing.  Candee Furbish, MD

## 2017-01-16 ENCOUNTER — Other Ambulatory Visit (HOSPITAL_COMMUNITY): Payer: Self-pay | Admitting: Emergency Medicine

## 2017-01-16 NOTE — Patient Instructions (Signed)
Clare Casto  01/16/2017   Your procedure is scheduled on: 01-29-17  Report to [facility] Main  Entrance    Report to admitting at 6AM   Call this number if you have problems the morning of surgery [phone]    Remember: ONLY 1 PERSON MAY GO WITH YOU TO SHORT STAY TO GET  READY MORNING OF YOUR SURGERY.    Do not eat food or drink liquids :After Midnight.     Take these medicines the morning of surgery with A SIP OF WATER: CARVEDILOL, FINASTERIDE, NEBULIZER AND INHALER IF NEEDED ([place])                                 You may not have any metal on your body including hair pins and              piercings  Do not wear jewelry, make-up, lotions, powders or perfumes, deodorant              Men may shave face and neck.   Do not bring valuables to the hospital. [place].  Contacts, dentures or bridgework may not be worn into surgery.  Leave suitcase in the car. After surgery it may be brought to your room.                  Please read over the following fact sheets you were given: _____________________________________________________________________             [facility] - Preparing for Surgery Before surgery, you can play an important role.  Because skin is not sterile, your skin needs to be as free of germs as possible.  You can reduce the number of germs on your skin by washing with CHG (chlorahexidine gluconate) soap before surgery.  CHG is an antiseptic cleaner which kills germs and bonds with the skin to continue killing germs even after washing. Please DO NOT use if you have an allergy to CHG or antibacterial soaps.  If your skin becomes reddened/irritated stop using the CHG and inform your nurse when you arrive at Short Stay. Do not shave (including legs and underarms) for at least 48 hours prior to the first CHG shower.  You may shave your face/neck. Please follow these  instructions carefully:  1.  Shower with CHG Soap the night before surgery and the  morning of Surgery.  2.  If you choose to wash your hair, wash your hair first as usual with your  normal  shampoo.  3.  After you shampoo, rinse your hair and body thoroughly to remove the  shampoo.                           4.  Use CHG as you would any other liquid soap.  You can apply chg directly  to the skin and wash                       Gently with a scrungie or clean washcloth.  5.  Apply the CHG Soap to your body ONLY FROM THE NECK DOWN.   Do not use on face/ open  Wound or open sores. Avoid contact with eyes, ears mouth and genitals (private parts).                       Wash face,  Genitals (private parts) with your normal soap.             6.  Wash thoroughly, paying special attention to the area where your surgery  will be performed.  7.  Thoroughly rinse your body with warm water from the neck down.  8.  DO NOT shower/wash with your normal soap after using and rinsing off  the CHG Soap.                9.  Pat yourself dry with a clean towel.            10.  Wear clean pajamas.            11.  Place clean sheets on your bed the night of your first shower and do not  sleep with pets. Day of Surgery : Do not apply any lotions/deodorants the morning of surgery.  Please wear clean clothes to the hospital/surgery center.  FAILURE TO FOLLOW THESE INSTRUCTIONS MAY RESULT IN THE CANCELLATION OF YOUR SURGERY PATIENT SIGNATURE_________________________________  NURSE SIGNATURE__________________________________  ________________________________________________________________________   Adam Phenix  An incentive spirometer is a tool that can help keep your lungs clear and active. This tool measures how well you are filling your lungs with each breath. Taking long deep breaths may help reverse or decrease the chance of developing breathing (pulmonary) problems (especially  infection) following:  A long period of time when you are unable to move or be active. BEFORE THE PROCEDURE   If the spirometer includes an indicator to show your best effort, your nurse or respiratory therapist will set it to a desired goal.  If possible, sit up straight or lean slightly forward. Try not to slouch.  Hold the incentive spirometer in an upright position. INSTRUCTIONS FOR USE  1. Sit on the edge of your bed if possible, or sit up as far as you can in bed or on a chair. 2. Hold the incentive spirometer in an upright position. 3. Breathe out normally. 4. Place the mouthpiece in your mouth and seal your lips tightly around it. 5. Breathe in slowly and as deeply as possible, raising the piston or the ball toward the top of the column. 6. Hold your breath for 3-5 seconds or for as long as possible. Allow the piston or ball to fall to the bottom of the column. 7. Remove the mouthpiece from your mouth and breathe out normally. 8. Rest for a few seconds and repeat Steps 1 through 7 at least 10 times every 1-2 hours when you are awake. Take your time and take a few normal breaths between deep breaths. 9. The spirometer may include an indicator to show your best effort. Use the indicator as a goal to work toward during each repetition. 10. After each set of 10 deep breaths, practice coughing to be sure your lungs are clear. If you have an incision (the cut made at the time of surgery), support your incision when coughing by placing a pillow or rolled up towels firmly against it. Once you are able to get out of bed, walk around indoors and cough well. You may stop using the incentive spirometer when instructed by your caregiver.  RISKS AND COMPLICATIONS  Take your time so you do not get  dizzy or light-headed.  If you are in pain, you may need to take or ask for pain medication before doing incentive spirometry. It is harder to take a deep breath if you are having pain. AFTER  USE  Rest and breathe slowly and easily.  It can be helpful to keep track of a log of your progress. Your caregiver can provide you with a simple table to help with this. If you are using the spirometer at home, follow these instructions: [place] IF:   You are having difficultly using the spirometer.  You have trouble using the spirometer as often as instructed.  Your pain medication is not giving enough relief while using the spirometer.  You develop fever of 100.5 F (38.1 C) or higher. SEEK IMMEDIATE MEDICAL CARE IF:   You cough up bloody sputum that had not been present before.  You develop fever of 102 F (38.9 C) or greater.  You develop worsening pain at or near the incision site. MAKE SURE YOU:   Understand these instructions.  Will watch your condition.  Will get help right away if you are not doing well or get worse. Document Released: 05/13/2006 Document Revised: 03/25/2011 Document Reviewed: 07/14/2006 [facility] Patient Information 2014 [place], [place].   ________________________________________________________________________

## 2017-01-16 NOTE — Progress Notes (Signed)
Cardiac clearance dr Marlou Porch 01-09-17 on chart. [facility] cardiology dr Marlou Porch 05-22-16 epic   ekg 05-22-16 epic   Echo 05-25-15 epic

## 2017-01-20 ENCOUNTER — Encounter (INDEPENDENT_AMBULATORY_CARE_PROVIDER_SITE_OTHER): Payer: Self-pay

## 2017-01-20 ENCOUNTER — Ambulatory Visit (HOSPITAL_COMMUNITY)
Admission: RE | Admit: 2017-01-20 | Discharge: 2017-01-20 | Disposition: A | Discharge status: Home / Self Care | Payer: Medicare Other | Source: Ambulatory Visit | Attending: Surgical | Admitting: Surgical

## 2017-01-20 ENCOUNTER — Other Ambulatory Visit: Payer: Self-pay

## 2017-01-20 ENCOUNTER — Encounter (HOSPITAL_COMMUNITY)
Admission: RE | Admit: 2017-01-20 | Discharge: 2017-01-20 | Disposition: A | Discharge status: Home / Self Care | Payer: Medicare Other | Source: Ambulatory Visit | Attending: Orthopedic Surgery | Admitting: Orthopedic Surgery

## 2017-01-20 ENCOUNTER — Encounter (HOSPITAL_COMMUNITY): Payer: Self-pay

## 2017-01-20 DIAGNOSIS — I2510 Atherosclerotic heart disease of native coronary artery without angina pectoris: Secondary | ICD-10-CM | POA: Diagnosis not present

## 2017-01-20 DIAGNOSIS — J42 Unspecified chronic bronchitis: Secondary | ICD-10-CM | POA: Insufficient documentation

## 2017-01-20 DIAGNOSIS — Z01818 Encounter for other preprocedural examination: Secondary | ICD-10-CM | POA: Diagnosis not present

## 2017-01-20 DIAGNOSIS — R05 Cough: Secondary | ICD-10-CM | POA: Diagnosis not present

## 2017-01-20 DIAGNOSIS — Z7982 Long term (current) use of aspirin: Secondary | ICD-10-CM | POA: Diagnosis not present

## 2017-01-20 DIAGNOSIS — K219 Gastro-esophageal reflux disease without esophagitis: Secondary | ICD-10-CM | POA: Diagnosis not present

## 2017-01-20 DIAGNOSIS — M1712 Unilateral primary osteoarthritis, left knee: Secondary | ICD-10-CM | POA: Insufficient documentation

## 2017-01-20 DIAGNOSIS — E785 Hyperlipidemia, unspecified: Secondary | ICD-10-CM | POA: Diagnosis not present

## 2017-01-20 DIAGNOSIS — I420 Dilated cardiomyopathy: Secondary | ICD-10-CM | POA: Insufficient documentation

## 2017-01-20 DIAGNOSIS — I252 Old myocardial infarction: Secondary | ICD-10-CM | POA: Insufficient documentation

## 2017-01-20 DIAGNOSIS — Z79899 Other long term (current) drug therapy: Secondary | ICD-10-CM | POA: Insufficient documentation

## 2017-01-20 DIAGNOSIS — R918 Other nonspecific abnormal finding of lung field: Secondary | ICD-10-CM | POA: Insufficient documentation

## 2017-01-20 LAB — CBC WITH DIFFERENTIAL/PLATELET
Basophils Absolute: 0 10*3/uL (ref 0.0–0.1)
Basophils Relative: 0 %
Eosinophils Absolute: 0.2 10*3/uL (ref 0.0–0.7)
Eosinophils Relative: 2 %
HCT: 46.7 % (ref 39.0–52.0)
Hemoglobin: 15.4 g/dL (ref 13.0–17.0)
Lymphocytes Relative: 22 %
Lymphs Abs: 2 10*3/uL (ref 0.7–4.0)
MCH: 30.6 pg (ref 26.0–34.0)
MCHC: 33 g/dL (ref 30.0–36.0)
MCV: 92.8 fL (ref 78.0–100.0)
Monocytes Absolute: 0.7 10*3/uL (ref 0.1–1.0)
Monocytes Relative: 7 %
Neutro Abs: 6.3 10*3/uL (ref 1.7–7.7)
Neutrophils Relative %: 69 %
Platelets: 163 10*3/uL (ref 150–400)
RBC: 5.03 MIL/uL (ref 4.22–5.81)
RDW: 14.4 % (ref 11.5–15.5)
WBC: 9.2 10*3/uL (ref 4.0–10.5)

## 2017-01-20 LAB — URINALYSIS, ROUTINE W REFLEX MICROSCOPIC
Bilirubin Urine: NEGATIVE
Glucose, UA: NEGATIVE mg/dL
Hgb urine dipstick: NEGATIVE
Ketones, ur: NEGATIVE mg/dL
Nitrite: NEGATIVE
Protein, ur: NEGATIVE mg/dL
Specific Gravity, Urine: 1.021 (ref 1.005–1.030)
Squamous Epithelial / LPF: NONE SEEN
pH: 5 (ref 5.0–8.0)

## 2017-01-20 LAB — COMPREHENSIVE METABOLIC PANEL
ALT: 16 U/L — ABNORMAL LOW (ref 17–63)
AST: 18 U/L (ref 15–41)
Albumin: 3.8 g/dL (ref 3.5–5.0)
Alkaline Phosphatase: 62 U/L (ref 38–126)
Anion gap: 6 (ref 5–15)
BUN: 22 mg/dL — ABNORMAL HIGH (ref 6–20)
CO2: 25 mmol/L (ref 22–32)
Calcium: 8.9 mg/dL (ref 8.9–10.3)
Chloride: 109 mmol/L (ref 101–111)
Creatinine, Ser: 0.9 mg/dL (ref 0.61–1.24)
GFR calc Af Amer: >60 mL/min (ref >60)
GFR calc non Af Amer: >60 mL/min (ref >60)
Glucose, Bld: 111 mg/dL — ABNORMAL HIGH (ref 65–99)
Potassium: 4.1 mmol/L (ref 3.5–5.1)
Sodium: 140 mmol/L (ref 135–145)
Total Bilirubin: 1.2 mg/dL (ref 0.3–1.2)
Total Protein: 6.6 g/dL (ref 6.5–8.1)

## 2017-01-20 LAB — APTT: aPTT: 27 seconds (ref 24–36)

## 2017-01-20 LAB — PROTIME-INR
INR: 0.96
Prothrombin Time: 12.7 seconds (ref 11.4–15.2)

## 2017-01-20 LAB — SURGICAL PCR SCREEN
MRSA, PCR: NEGATIVE
Staphylococcus aureus: POSITIVE — AB

## 2017-01-20 LAB — ABO/RH: ABO/RH(D): O NEG

## 2017-01-20 NOTE — Progress Notes (Signed)
CXR RESULT ROUTED VIA Epic TO DR Jori Moll GIOFFRE

## 2017-01-21 ENCOUNTER — Other Ambulatory Visit: Payer: Self-pay | Admitting: Physician Assistant

## 2017-01-23 NOTE — H&P (Signed)
TOTAL KNEE ADMISSION H&P  Patient is being admitted for left total knee arthroplasty.  Subjective:  Chief Complaint:left knee pain.  HPI: Tony Patrick, 66 y.o. male, has a history of pain and functional disability in the left knee due to trauma and arthritis and has failed non-surgical conservative treatments for greater than 12 weeks to includeNSAID's and/or analgesics, corticosteriod injections, viscosupplementation injections, use of assistive devices and activity modification.  Onset of symptoms was gradual, starting 2 years ago with gradually worsening course since that time. The patient noted prior procedures on the knee to include  arthroscopy and menisectomy on the left knee(s).  Patient currently rates pain in the left knee(s) at 8 out of 10 with activity. Patient has night pain, worsening of pain with activity and weight bearing, pain that interferes with activities of daily living, pain with passive range of motion, crepitus and joint swelling.  Patient has evidence of periarticular osteophytes and joint space narrowing by imaging studies. There is no active infection.  Patient Active Problem List   Diagnosis Date Noted  . Achilles tendinitis of right lower extremity 11/21/2016  . Dilated cardiomyopathy ([place]) 05/22/2016  . History of MI (myocardial infarction) 05/22/2016  . Benign prostatic hyperplasia with urinary frequency 05/16/2016  . Tear of rotator cuff 05/26/2015  . Shoulder injury 04/06/2015  . Rotator cuff syndrome 04/06/2015  . Special screening for malignant neoplasms, colon   . CAD (coronary artery disease)   . GERD (gastroesophageal reflux disease)   . Dyslipidemia   . Ejection fraction < 50%   . Hypoxia    Past Medical History:  Diagnosis Date  . Arthritis   . Atherosclerotic plaque   . Basal cell carcinoma   . CAD (coronary artery disease)    Anterior MI, [facility], 1995, no PCI, total LAD  / catheterization 2001, 30% LAD beyond the origin of a large diagonal,  circumflex normal, RCA normal, anterolateral and apical  akinesis, ejection fraction 25-35% range  . Dyslipidemia   . Dysrhythmia   . Ejection fraction < 50%    EF 25-35%, catheterization 2001 / EF 20-25%, global hypokinesis, echo in June, 2012  . GERD (gastroesophageal reflux disease)   . History of bronchitis   . Hypoxia    Pneumonia, hospitalization, June, 2012  . Kidney stones 1990's  . MI (myocardial infarction) ([place]) 1995  . Pneumonia 2012  . S/P colectomy    Benign tumor    Past Surgical History:  Procedure Laterality Date  . CARDIAC CATHETERIZATION    . CARDIAC VALVE REPLACEMENT     denies   . CHEST TUBE INSERTION    . COLECTOMY  1981   benign mass  . COLONOSCOPY WITH PROPOFOL N/A 07/04/2014   Procedure: COLONOSCOPY WITH PROPOFOL;  Surgeon: Gatha Mayer, MD;  Location: [facility] ENDOSCOPY;  Service: Endoscopy;  Laterality: N/A;  . KNEE ARTHROSCOPY Left 07/19/2015   Procedure: LEFT ARTHROSCOPY KNEE WITH MEDIAL MENISCECTOMY;  Surgeon: Latanya Maudlin, MD;  Location: [facility] ORS;  Service: Orthopedics;  Laterality: Left;  LMA  . LEG SURGERY Left    cyst  . PLEURAL SCARIFICATION  1978  . SHOULDER OPEN ROTATOR CUFF REPAIR Left 05/26/2015   Procedure: LEFT OPEN ACROMINECTOMY AND REPAIR WITH GRAFT AND ANCHOR ROTATOR CUFF TEAR ;  Surgeon: Latanya Maudlin, MD;  Location: [facility] ORS;  Service: Orthopedics;  Laterality: Left;  Left shoulder block in holding       Current Outpatient Medications  Medication Sig Dispense Refill Last Dose  . albuterol (PROVENTIL HFA;VENTOLIN  HFA) 108 (90 Base) MCG/ACT inhaler Inhale 1-2 puffs into the lungs every 6 (six) hours as needed for wheezing. 1 Inhaler 0 Taking  . aspirin EC 81 MG tablet Take 81 mg by mouth daily.   Taking  . carvedilol (COREG) 25 MG tablet TAKE 1 TABLET TWICE A DAY 180 tablet 3 Taking  . cetirizine (ZYRTEC) 10 MG tablet TAKE 1 TABLET DAILY 90 tablet 0 Taking  . diclofenac sodium (VOLTAREN) 1 % GEL Apply 1 application topically 3 (three) times  daily as needed (for hands).   Taking  . finasteride (PROSCAR) 5 MG tablet Take 5 mg by mouth daily.    Taking  . fluticasone (FLONASE) 50 MCG/ACT nasal spray Place 2 sprays into both nostrils daily. (Patient taking differently: Place 2 sprays into both nostrils at bedtime as needed for allergies. ) 16 g 11 Taking  . ibuprofen (ADVIL,MOTRIN) 800 MG tablet TAKE 1 TABLET THREE TIMES A DAY (Patient taking differently: TAKE 1 TABLET THREE TIMES A DAY AS NEEDED FOR PAIN) 270 tablet 3 Taking  . ipratropium-albuterol (DUONEB) 0.5-2.5 (3) MG/3ML SOLN Take 3 mLs by nebulization every 6 (six) hours as needed (shortness of breath).   Taking  . omeprazole (PRILOSEC) 20 MG capsule TAKE 1 CAPSULE DAILY 90 capsule 1   . simvastatin (ZOCOR) 40 MG tablet TAKE 1 TABLET EVERY EVENING 90 tablet 1   . tamsulosin (FLOMAX) 0.4 MG CAPS capsule Take 0.4 mg by mouth every evening.    Taking  . enalapril (VASOTEC) 20 MG tablet TAKE ONE AND ONE-HALF TABLETS TWICE A DAY 270 tablet 0    Allergies  Allergen Reactions  . Ivp Dye [Iodinated Diagnostic Agents] Swelling and Other (See Comments)    Arrest  . Morphine And Related Nausea And Vomiting    Social History   Tobacco Use  . Smoking status: Former Smoker    Packs/day: 2.00    Years: 32.00    Pack years: 64.00    Types: Cigarettes    Last attempt to quit: 04/14/1992    Years since quitting: 24.7  . Smokeless tobacco: Never Used  Substance Use Topics  . Alcohol use: No    Family History  Problem Relation Age of Onset  . Diabetes Mother   . Hypertension Mother   . Cancer Maternal Grandmother        type unknown  . Melanoma Brother   . Cancer Brother        type unknown  . Prostatitis Brother      Review of Systems  Constitutional: Negative.   HENT: Negative.   Eyes: Negative.   Respiratory: Negative.   Cardiovascular: Negative.   Gastrointestinal: Positive for heartburn. Negative for abdominal pain, blood in stool, constipation, diarrhea, melena,  nausea and vomiting.  Genitourinary: Negative.   Musculoskeletal: Positive for falls, joint pain and myalgias. Negative for back pain and neck pain.  Skin: Negative.   Neurological: Negative.   Endo/Heme/Allergies: Negative.   Psychiatric/Behavioral: Negative.     Objective:  Physical Exam  Constitutional: He is oriented to person, place, and time. No distress.  Morbidly obese  HENT:  Head: Normocephalic and atraumatic.  Right Ear: External ear normal.  Left Ear: External ear normal.  Nose: Nose normal.  Mouth/Throat: Oropharynx is clear and moist.  Eyes: Conjunctivae and EOM are normal.  Neck: Normal range of motion. Neck supple.  Cardiovascular: Normal rate, regular rhythm, normal heart sounds and intact distal pulses.  No murmur heard. Respiratory: Effort normal and breath sounds  normal. No respiratory distress. He has no wheezes.  GI: Soft. Bowel sounds are normal. He exhibits no distension. There is no tenderness.  Musculoskeletal:       Right hip: Normal.       Left hip: Normal.       Right knee: Normal.       Left knee: He exhibits decreased range of motion and swelling. He exhibits no effusion and no erythema. Tenderness found. Medial joint line and lateral joint line tenderness noted.  Neurological: He is alert and oriented to person, place, and time. He has normal strength. No sensory deficit.  Skin: No rash noted. He is not diaphoretic. No erythema.  Psychiatric: He has a normal mood and affect. His behavior is normal.    Ht: 5 ft 9.25 in Wt: 247.1 lbs  BMI: 36.2  BP: 140/85 sitting L arm Pulse: 80 bpm   Imaging Review Plain radiographs demonstrate severe degenerative joint disease of the left knee(s). The overall alignment ismild varus. The bone quality appears to be good for age and reported activity level.  Assessment/Plan:  End stage post-traumatic osteoarthritis, left knee   The patient history, physical examination, clinical judgment of the provider  and imaging studies are consistent with end stage degenerative joint disease of the left knee(s) and total knee arthroplasty is deemed medically necessary. The treatment options including medical management, injection therapy arthroscopy and arthroplasty were discussed at length. The risks and benefits of total knee arthroplasty were presented and reviewed. The risks due to aseptic loosening, infection, stiffness, patella tracking problems, thromboembolic complications and other imponderables were discussed. The patient acknowledged the explanation, agreed to proceed with the plan and consent was signed. Patient is being admitted for inpatient treatment for surgery, pain control, PT, OT, prophylactic antibiotics, VTE prophylaxis, progressive ambulation and ADL's and discharge planning. The patient is planning to be discharged outpatient therapy at [facility]     PCP: Particia Nearing, PA-C Cardio: Dr. Marlou Porch No DME needed   Ardeen Jourdain, PA-C

## 2017-01-24 ENCOUNTER — Telehealth: Payer: Self-pay | Admitting: Physician Assistant

## 2017-01-24 MED ORDER — HYDROCODONE-HOMATROPINE 5-1.5 MG/5ML PO SYRP: 5.0000 mL | ORAL_SOLUTION | Freq: Four times a day (QID) | ORAL | 0 refills | Status: DC | PRN

## 2017-01-24 NOTE — Telephone Encounter (Signed)
Patient calls to request cough syrup refill be done today.  His coughing bothers him since he has a heart condition.

## 2017-01-24 NOTE — Telephone Encounter (Signed)
What is the name of the medication? Hydrocodone cough medication  Have you contacted your pharmacy to request a refill? no  Which pharmacy would you like this sent to? [facility], pt states he is usually prescribed this once a year for cough and congestion   Patient notified that their request is being sent to the clinical staff for review and that they should receive a call once it is complete. If they do not receive a call within 24 hours they can check with their pharmacy or our office.

## 2017-01-24 NOTE — Telephone Encounter (Signed)
sent 

## 2017-01-24 NOTE — Telephone Encounter (Signed)
Patient aware.

## 2017-01-27 ENCOUNTER — Other Ambulatory Visit: Payer: Self-pay | Admitting: Physician Assistant

## 2017-01-27 DIAGNOSIS — H2513 Age-related nuclear cataract, bilateral: Secondary | ICD-10-CM | POA: Diagnosis not present

## 2017-01-28 NOTE — Anesthesia Preprocedure Evaluation (Addendum)
Anesthesia Evaluation  Patient identified by MRN, date of birth, ID band Patient awake    Reviewed: Allergy & Precautions, H&P , NPO status , Patient's Chart, lab work & pertinent test results, reviewed documented beta blocker date and time   Airway Mallampati: II  TM Distance: >3 FB Neck ROM: Full    Dental no notable dental hx. (+) Dental Advisory Given, Teeth Intact   Pulmonary neg pulmonary ROS, former smoker,    Pulmonary exam normal breath sounds clear to auscultation       Cardiovascular Exercise Tolerance: Good + CAD   Rhythm:Regular Rate:Normal     Neuro/Psych negative neurological ROS  negative psych ROS   GI/Hepatic Neg liver ROS, GERD  ,  Endo/Other  Morbid obesity  Renal/GU negative Renal ROS  negative genitourinary   Musculoskeletal  (+) Arthritis , Osteoarthritis,    Abdominal   Peds  Hematology negative hematology ROS (+)   Anesthesia Other Findings   Reproductive/Obstetrics negative OB ROS                           Anesthesia Physical Anesthesia Plan  ASA: III  Anesthesia Plan: Spinal   Post-op Pain Management:  Regional for Post-op pain   Induction: Intravenous  PONV Risk Score and Plan: 2 and Ondansetron, Dexamethasone and Propofol infusion  Airway Management Planned: Simple Face Mask  Additional Equipment:   Intra-op Plan:   Post-operative Plan:   Informed Consent: I have reviewed the patients History and Physical, chart, labs and discussed the procedure including the risks, benefits and alternatives for the proposed anesthesia with the patient or authorized representative who has indicated his/her understanding and acceptance.   Dental advisory given  Plan Discussed with: CRNA  Anesthesia Plan Comments:         Anesthesia Quick Evaluation

## 2017-01-29 ENCOUNTER — Encounter (HOSPITAL_COMMUNITY)
Admission: RE | Disposition: A | Discharge status: Home / Self Care | Payer: Self-pay | Source: Home / Self Care | Attending: Orthopedic Surgery

## 2017-01-29 ENCOUNTER — Inpatient Hospital Stay (HOSPITAL_COMMUNITY): Payer: Worker's Compensation | Admitting: Anesthesiology

## 2017-01-29 ENCOUNTER — Inpatient Hospital Stay (HOSPITAL_COMMUNITY)
Admission: RE | Admit: 2017-01-29 | Discharge: 2017-01-31 | DRG: 470 | Disposition: A | Discharge status: Home / Self Care | Payer: Worker's Compensation | Attending: Orthopedic Surgery | Admitting: Orthopedic Surgery

## 2017-01-29 ENCOUNTER — Other Ambulatory Visit: Payer: Self-pay

## 2017-01-29 ENCOUNTER — Encounter (HOSPITAL_COMMUNITY): Payer: Self-pay | Admitting: Registered Nurse

## 2017-01-29 DIAGNOSIS — Z8701 Personal history of pneumonia (recurrent): Secondary | ICD-10-CM

## 2017-01-29 DIAGNOSIS — Z87442 Personal history of urinary calculi: Secondary | ICD-10-CM | POA: Diagnosis not present

## 2017-01-29 DIAGNOSIS — I252 Old myocardial infarction: Secondary | ICD-10-CM

## 2017-01-29 DIAGNOSIS — Z91041 Radiographic dye allergy status: Secondary | ICD-10-CM

## 2017-01-29 DIAGNOSIS — M25562 Pain in left knee: Secondary | ICD-10-CM | POA: Diagnosis present

## 2017-01-29 DIAGNOSIS — Z885 Allergy status to narcotic agent status: Secondary | ICD-10-CM

## 2017-01-29 DIAGNOSIS — Z6836 Body mass index (BMI) 36.0-36.9, adult: Secondary | ICD-10-CM | POA: Diagnosis not present

## 2017-01-29 DIAGNOSIS — M24562 Contracture, left knee: Secondary | ICD-10-CM | POA: Diagnosis present

## 2017-01-29 DIAGNOSIS — Z808 Family history of malignant neoplasm of other organs or systems: Secondary | ICD-10-CM | POA: Diagnosis not present

## 2017-01-29 DIAGNOSIS — Z7951 Long term (current) use of inhaled steroids: Secondary | ICD-10-CM | POA: Diagnosis not present

## 2017-01-29 DIAGNOSIS — E785 Hyperlipidemia, unspecified: Secondary | ICD-10-CM | POA: Diagnosis present

## 2017-01-29 DIAGNOSIS — Z952 Presence of prosthetic heart valve: Secondary | ICD-10-CM

## 2017-01-29 DIAGNOSIS — Z7982 Long term (current) use of aspirin: Secondary | ICD-10-CM | POA: Diagnosis not present

## 2017-01-29 DIAGNOSIS — K219 Gastro-esophageal reflux disease without esophagitis: Secondary | ICD-10-CM | POA: Diagnosis present

## 2017-01-29 DIAGNOSIS — Z8249 Family history of ischemic heart disease and other diseases of the circulatory system: Secondary | ICD-10-CM

## 2017-01-29 DIAGNOSIS — Z87891 Personal history of nicotine dependence: Secondary | ICD-10-CM | POA: Diagnosis not present

## 2017-01-29 DIAGNOSIS — I420 Dilated cardiomyopathy: Secondary | ICD-10-CM | POA: Diagnosis present

## 2017-01-29 DIAGNOSIS — Z9049 Acquired absence of other specified parts of digestive tract: Secondary | ICD-10-CM

## 2017-01-29 DIAGNOSIS — M1732 Unilateral post-traumatic osteoarthritis, left knee: Principal | ICD-10-CM | POA: Diagnosis present

## 2017-01-29 DIAGNOSIS — Z79899 Other long term (current) drug therapy: Secondary | ICD-10-CM

## 2017-01-29 DIAGNOSIS — I2510 Atherosclerotic heart disease of native coronary artery without angina pectoris: Secondary | ICD-10-CM | POA: Diagnosis present

## 2017-01-29 DIAGNOSIS — Z96651 Presence of right artificial knee joint: Secondary | ICD-10-CM | POA: Diagnosis present

## 2017-01-29 DIAGNOSIS — N400 Enlarged prostate without lower urinary tract symptoms: Secondary | ICD-10-CM | POA: Diagnosis present

## 2017-01-29 DIAGNOSIS — Z85828 Personal history of other malignant neoplasm of skin: Secondary | ICD-10-CM | POA: Diagnosis not present

## 2017-01-29 HISTORY — PX: TOTAL KNEE ARTHROPLASTY: SHX125

## 2017-01-29 LAB — TYPE AND SCREEN
ABO/RH(D): O NEG
Antibody Screen: NEGATIVE

## 2017-01-29 SURGERY — ARTHROPLASTY, KNEE, TOTAL
Anesthesia: Spinal | Site: Knee | Laterality: Left

## 2017-01-29 MED ORDER — ROPIVACAINE HCL 7.5 MG/ML IJ SOLN
INTRAMUSCULAR | Status: DC | PRN
  Administered 2017-01-29: 20 mL via PERINEURAL

## 2017-01-29 MED ORDER — POLYMYXIN B SULFATE 500000 UNITS IJ SOLR
INTRAMUSCULAR | Status: AC
  Filled 2017-01-29: qty 500000

## 2017-01-29 MED ORDER — CEFAZOLIN SODIUM-DEXTROSE 1-4 GM/50ML-% IV SOLN
1.0000 g | Freq: Four times a day (QID) | INTRAVENOUS | Status: AC
  Administered 2017-01-29 (×2): 1 g via INTRAVENOUS
  Filled 2017-01-29 (×2): qty 50

## 2017-01-29 MED ORDER — HYDROMORPHONE HCL 1 MG/ML IJ SOLN
0.5000 mg | INTRAMUSCULAR | Status: DC | PRN
  Administered 2017-01-29: 0.5 mg via INTRAVENOUS
  Filled 2017-01-29: qty 0.5

## 2017-01-29 MED ORDER — SODIUM CHLORIDE 0.9 % IV SOLN
INTRAVENOUS | Status: AC | PRN
  Administered 2017-01-29: 2000 mg via TOPICAL

## 2017-01-29 MED ORDER — METHOCARBAMOL 500 MG PO TABS
500.0000 mg | ORAL_TABLET | Freq: Four times a day (QID) | ORAL | Status: DC | PRN
  Administered 2017-01-30 – 2017-01-31 (×3): 500 mg via ORAL
  Filled 2017-01-29 (×3): qty 1

## 2017-01-29 MED ORDER — HYDROMORPHONE HCL 1 MG/ML IJ SOLN
INTRAMUSCULAR | Status: AC
  Administered 2017-01-29: 0.5 mg via INTRAVENOUS
  Filled 2017-01-29: qty 1

## 2017-01-29 MED ORDER — ALBUTEROL SULFATE (2.5 MG/3ML) 0.083% IN NEBU: 3.0000 mL | INHALATION_SOLUTION | Freq: Four times a day (QID) | RESPIRATORY_TRACT | Status: DC | PRN

## 2017-01-29 MED ORDER — METHOCARBAMOL 1000 MG/10ML IJ SOLN
500.0000 mg | Freq: Four times a day (QID) | INTRAVENOUS | Status: DC | PRN
  Administered 2017-01-29: 500 mg via INTRAVENOUS
  Filled 2017-01-29: qty 550

## 2017-01-29 MED ORDER — ENALAPRIL MALEATE 10 MG PO TABS
20.0000 mg | ORAL_TABLET | Freq: Every day | ORAL | Status: DC
  Filled 2017-01-29: qty 2

## 2017-01-29 MED ORDER — ONDANSETRON HCL 4 MG/2ML IJ SOLN
INTRAMUSCULAR | Status: AC
  Filled 2017-01-29: qty 2

## 2017-01-29 MED ORDER — HEMOSTATIC AGENTS (NO CHARGE) OPTIME
TOPICAL | Status: DC | PRN
  Administered 2017-01-29: 1 via TOPICAL

## 2017-01-29 MED ORDER — CEFAZOLIN SODIUM-DEXTROSE 2-4 GM/100ML-% IV SOLN
2.0000 g | INTRAVENOUS | Status: AC
  Administered 2017-01-29: 2 g via INTRAVENOUS
  Filled 2017-01-29: qty 100

## 2017-01-29 MED ORDER — PROPOFOL 10 MG/ML IV BOLUS
INTRAVENOUS | Status: AC
  Filled 2017-01-29: qty 20

## 2017-01-29 MED ORDER — FLUTICASONE PROPIONATE 50 MCG/ACT NA SUSP
2.0000 | Freq: Every evening | NASAL | Status: DC | PRN
  Filled 2017-01-29: qty 16

## 2017-01-29 MED ORDER — MENTHOL 3 MG MT LOZG: 1.0000 | LOZENGE | OROMUCOSAL | Status: DC | PRN

## 2017-01-29 MED ORDER — SODIUM CHLORIDE 0.9 % IV SOLN
INTRAVENOUS | Status: DC | PRN
  Administered 2017-01-29: 500 mL

## 2017-01-29 MED ORDER — FENTANYL CITRATE (PF) 100 MCG/2ML IJ SOLN
INTRAMUSCULAR | Status: DC | PRN
  Administered 2017-01-29 (×3): 50 ug via INTRAVENOUS

## 2017-01-29 MED ORDER — BUPIVACAINE LIPOSOME 1.3 % IJ SUSP
INTRAMUSCULAR | Status: DC | PRN
  Administered 2017-01-29: 20 mL

## 2017-01-29 MED ORDER — FENTANYL CITRATE (PF) 100 MCG/2ML IJ SOLN
INTRAMUSCULAR | Status: AC
  Filled 2017-01-29: qty 2

## 2017-01-29 MED ORDER — POLYETHYLENE GLYCOL 3350 17 G PO PACK: 17.0000 g | PACK | Freq: Every day | ORAL | Status: DC | PRN

## 2017-01-29 MED ORDER — BISACODYL 5 MG PO TBEC: 5.0000 mg | DELAYED_RELEASE_TABLET | Freq: Every day | ORAL | Status: DC | PRN

## 2017-01-29 MED ORDER — PANTOPRAZOLE SODIUM 40 MG PO TBEC
40.0000 mg | DELAYED_RELEASE_TABLET | Freq: Every day | ORAL | Status: DC
  Administered 2017-01-29 – 2017-01-31 (×3): 40 mg via ORAL
  Filled 2017-01-29 (×3): qty 1

## 2017-01-29 MED ORDER — SODIUM CHLORIDE 0.9 % IJ SOLN
INTRAMUSCULAR | Status: DC | PRN
  Administered 2017-01-29: 20 mL

## 2017-01-29 MED ORDER — ENALAPRIL MALEATE 10 MG PO TABS
30.0000 mg | ORAL_TABLET | Freq: Two times a day (BID) | ORAL | Status: DC
  Administered 2017-01-30 – 2017-01-31 (×3): 30 mg via ORAL
  Filled 2017-01-29 (×3): qty 3

## 2017-01-29 MED ORDER — PHENOL 1.4 % MT LIQD: 1.0000 | OROMUCOSAL | Status: DC | PRN

## 2017-01-29 MED ORDER — DEXAMETHASONE SODIUM PHOSPHATE 10 MG/ML IJ SOLN
INTRAMUSCULAR | Status: DC | PRN
  Administered 2017-01-29: 10 mg via INTRAVENOUS

## 2017-01-29 MED ORDER — TAMSULOSIN HCL 0.4 MG PO CAPS
0.4000 mg | ORAL_CAPSULE | Freq: Every evening | ORAL | Status: DC
  Administered 2017-01-30: 0.4 mg via ORAL
  Filled 2017-01-29: qty 1

## 2017-01-29 MED ORDER — LACTATED RINGERS IV SOLN
INTRAVENOUS | Status: DC
  Administered 2017-01-29 (×2): via INTRAVENOUS
  Administered 2017-01-29: 1000 mL via INTRAVENOUS
  Administered 2017-01-29: 09:00:00 via INTRAVENOUS

## 2017-01-29 MED ORDER — FERROUS SULFATE 325 (65 FE) MG PO TABS
325.0000 mg | ORAL_TABLET | Freq: Three times a day (TID) | ORAL | Status: DC
  Administered 2017-01-30 (×2): 325 mg via ORAL
  Filled 2017-01-29 (×2): qty 1

## 2017-01-29 MED ORDER — SODIUM CHLORIDE 0.9 % IR SOLN
Status: DC | PRN
  Administered 2017-01-29: 1000 mL

## 2017-01-29 MED ORDER — CHLORHEXIDINE GLUCONATE 4 % EX LIQD: 60.0000 mL | Freq: Once | CUTANEOUS | Status: DC

## 2017-01-29 MED ORDER — BUPIVACAINE HCL (PF) 0.25 % IJ SOLN
INTRAMUSCULAR | Status: AC
  Filled 2017-01-29: qty 30

## 2017-01-29 MED ORDER — CELECOXIB 200 MG PO CAPS
200.0000 mg | ORAL_CAPSULE | Freq: Two times a day (BID) | ORAL | Status: DC
  Administered 2017-01-29 – 2017-01-31 (×5): 200 mg via ORAL
  Filled 2017-01-29 (×5): qty 1

## 2017-01-29 MED ORDER — TRANEXAMIC ACID 1000 MG/10ML IV SOLN
2000.0000 mg | Freq: Once | INTRAVENOUS | Status: DC
  Filled 2017-01-29: qty 20

## 2017-01-29 MED ORDER — PROPOFOL 500 MG/50ML IV EMUL
INTRAVENOUS | Status: DC | PRN
  Administered 2017-01-29: 100 ug/kg/min via INTRAVENOUS

## 2017-01-29 MED ORDER — HYDROCODONE-ACETAMINOPHEN 10-325 MG PO TABS
2.0000 | ORAL_TABLET | ORAL | Status: DC | PRN
  Administered 2017-01-29 (×2): 2 via ORAL
  Administered 2017-01-31 (×2): 1 via ORAL
  Filled 2017-01-29 (×4): qty 2

## 2017-01-29 MED ORDER — BUPIVACAINE LIPOSOME 1.3 % IJ SUSP
20.0000 mL | Freq: Once | INTRAMUSCULAR | Status: DC
  Filled 2017-01-29: qty 20

## 2017-01-29 MED ORDER — FINASTERIDE 5 MG PO TABS
5.0000 mg | ORAL_TABLET | Freq: Every day | ORAL | Status: DC
  Administered 2017-01-30 – 2017-01-31 (×2): 5 mg via ORAL
  Filled 2017-01-29 (×2): qty 1

## 2017-01-29 MED ORDER — MIDAZOLAM HCL 5 MG/5ML IJ SOLN
INTRAMUSCULAR | Status: DC | PRN
  Administered 2017-01-29: 2 mg via INTRAVENOUS

## 2017-01-29 MED ORDER — LACTATED RINGERS IV SOLN
INTRAVENOUS | Status: DC
  Administered 2017-01-29: 21:00:00 via INTRAVENOUS
  Administered 2017-01-29: 14:00:00 100 mL/h via INTRAVENOUS
  Administered 2017-01-30: 08:00:00 via INTRAVENOUS

## 2017-01-29 MED ORDER — PHENYLEPHRINE HCL 10 MG/ML IJ SOLN
INTRAVENOUS | Status: DC | PRN
  Administered 2017-01-29: 25 ug/min via INTRAVENOUS

## 2017-01-29 MED ORDER — SODIUM CHLORIDE 0.9 % IJ SOLN
INTRAMUSCULAR | Status: AC
  Filled 2017-01-29: qty 50

## 2017-01-29 MED ORDER — ONDANSETRON HCL 4 MG/2ML IJ SOLN: 4.0000 mg | Freq: Four times a day (QID) | INTRAMUSCULAR | Status: DC | PRN

## 2017-01-29 MED ORDER — CARVEDILOL 25 MG PO TABS
25.0000 mg | ORAL_TABLET | Freq: Two times a day (BID) | ORAL | Status: DC
  Administered 2017-01-29 – 2017-01-31 (×4): 25 mg via ORAL
  Filled 2017-01-29 (×4): qty 1

## 2017-01-29 MED ORDER — STERILE WATER FOR IRRIGATION IR SOLN
Status: DC | PRN
Start: 2017-01-29 — End: 2017-01-29
  Administered 2017-01-29: 2000 mL

## 2017-01-29 MED ORDER — HYDROCODONE-ACETAMINOPHEN 5-325 MG PO TABS
1.0000 | ORAL_TABLET | ORAL | Status: DC | PRN
  Administered 2017-01-30 (×5): 1 via ORAL
  Filled 2017-01-29 (×4): qty 1

## 2017-01-29 MED ORDER — ONDANSETRON HCL 4 MG/2ML IJ SOLN
INTRAMUSCULAR | Status: DC | PRN
  Administered 2017-01-29: 4 mg via INTRAVENOUS

## 2017-01-29 MED ORDER — ONDANSETRON HCL 4 MG PO TABS: 4.0000 mg | ORAL_TABLET | Freq: Four times a day (QID) | ORAL | Status: DC | PRN

## 2017-01-29 MED ORDER — PROPOFOL 10 MG/ML IV BOLUS
INTRAVENOUS | Status: AC
  Filled 2017-01-29: qty 60

## 2017-01-29 MED ORDER — HYDROMORPHONE HCL 1 MG/ML IJ SOLN
0.2500 mg | INTRAMUSCULAR | Status: DC | PRN
  Administered 2017-01-29 (×2): 0.5 mg via INTRAVENOUS

## 2017-01-29 MED ORDER — DEXAMETHASONE SODIUM PHOSPHATE 10 MG/ML IJ SOLN
INTRAMUSCULAR | Status: AC
  Filled 2017-01-29: qty 1

## 2017-01-29 MED ORDER — MIDAZOLAM HCL 2 MG/2ML IJ SOLN
INTRAMUSCULAR | Status: AC
  Filled 2017-01-29: qty 2

## 2017-01-29 MED ORDER — ALUM & MAG HYDROXIDE-SIMETH 200-200-20 MG/5ML PO SUSP: 30.0000 mL | ORAL | Status: DC | PRN

## 2017-01-29 MED ORDER — BUPIVACAINE IN DEXTROSE 0.75-8.25 % IT SOLN
INTRATHECAL | Status: DC | PRN
  Administered 2017-01-29: 2 mL via INTRATHECAL

## 2017-01-29 MED ORDER — IPRATROPIUM-ALBUTEROL 0.5-2.5 (3) MG/3ML IN SOLN: 3.0000 mL | Freq: Four times a day (QID) | RESPIRATORY_TRACT | Status: DC | PRN

## 2017-01-29 MED ORDER — ACETAMINOPHEN 650 MG RE SUPP
650.0000 mg | RECTAL | Status: DC | PRN
Start: 2017-01-29 — End: 2017-01-31

## 2017-01-29 MED ORDER — FLEET ENEMA 7-19 GM/118ML RE ENEM: 1.0000 | ENEMA | Freq: Once | RECTAL | Status: DC | PRN

## 2017-01-29 MED ORDER — RIVAROXABAN 10 MG PO TABS
10.0000 mg | ORAL_TABLET | Freq: Every day | ORAL | Status: DC
  Administered 2017-01-30 – 2017-01-31 (×2): 10 mg via ORAL
  Filled 2017-01-29 (×2): qty 1

## 2017-01-29 MED ORDER — ACETAMINOPHEN 325 MG PO TABS: 650.0000 mg | ORAL_TABLET | ORAL | Status: DC | PRN

## 2017-01-29 SURGICAL SUPPLY — 71 items
AGENT HMST SPONGE THK3/8 (HEMOSTASIS) ×1
BAG DECANTER FOR FLEXI CONT (MISCELLANEOUS) ×4 IMPLANT
BAG SPEC THK2 15X12 ZIP CLS (MISCELLANEOUS) ×1
BAG ZIPLOCK 12X15 (MISCELLANEOUS) ×2 IMPLANT
BANDAGE ACE 4X5 VEL STRL LF (GAUZE/BANDAGES/DRESSINGS) ×3 IMPLANT
BANDAGE ACE 6X5 VEL STRL LF (GAUZE/BANDAGES/DRESSINGS) ×3 IMPLANT
BLADE SAG 18X100X1.27 (BLADE) ×3 IMPLANT
BLADE SAW SGTL 11.0X1.19X90.0M (BLADE) ×3 IMPLANT
BONE CEMENT GENTAMICIN (Cement) ×6 IMPLANT
CAP KNEE TOTAL 3 SIGMA ×2 IMPLANT
CEMENT BONE GENTAMICIN 40 (Cement) ×2 IMPLANT
CHLORAPREP W/TINT 26ML (MISCELLANEOUS) ×4 IMPLANT
CLOSURE WOUND 1/2 X4 (GAUZE/BANDAGES/DRESSINGS) ×1
COVER SURGICAL LIGHT HANDLE (MISCELLANEOUS) ×3 IMPLANT
CUFF TOURN SGL QUICK 34 (TOURNIQUET CUFF) ×3
CUFF TRNQT CYL 34X4X40X1 (TOURNIQUET CUFF) ×1 IMPLANT
DECANTER SPIKE VIAL GLASS SM (MISCELLANEOUS) ×3 IMPLANT
DRAPE INCISE 23X17 IOBAN STRL (DRAPES) ×2
DRAPE INCISE 23X17 STRL (DRAPES) IMPLANT
DRAPE INCISE IOBAN 23X17 STRL (DRAPES) ×1 IMPLANT
DRAPE INCISE IOBAN 66X45 STRL (DRAPES) IMPLANT
DRSG ADAPTIC 3X8 NADH LF (GAUZE/BANDAGES/DRESSINGS) ×3 IMPLANT
DRSG KUZMA FLUFF (GAUZE/BANDAGES/DRESSINGS) ×2 IMPLANT
ELECT REM PT RETURN 15FT ADLT (MISCELLANEOUS) ×3 IMPLANT
EVACUATOR 1/8 PVC DRAIN (DRAIN) ×3 IMPLANT
FACESHIELD WRAPAROUND (MASK) ×3 IMPLANT
FACESHIELD WRAPAROUND OR TEAM (MASK) ×1 IMPLANT
GAUZE SPONGE 4X4 12PLY STRL (GAUZE/BANDAGES/DRESSINGS) ×3 IMPLANT
GLOVE BIO SURGEON STRL SZ 6.5 (GLOVE) ×1 IMPLANT
GLOVE BIO SURGEON STRL SZ7.5 (GLOVE) ×2 IMPLANT
GLOVE BIO SURGEONS STRL SZ 6.5 (GLOVE) ×1
GLOVE BIOGEL PI IND STRL 6.5 (GLOVE) ×1 IMPLANT
GLOVE BIOGEL PI IND STRL 7.0 (GLOVE) IMPLANT
GLOVE BIOGEL PI IND STRL 8 (GLOVE) IMPLANT
GLOVE BIOGEL PI IND STRL 8.5 (GLOVE) ×1 IMPLANT
GLOVE BIOGEL PI INDICATOR 6.5 (GLOVE) ×6
GLOVE BIOGEL PI INDICATOR 7.0 (GLOVE) ×4
GLOVE BIOGEL PI INDICATOR 8 (GLOVE) ×2
GLOVE BIOGEL PI INDICATOR 8.5 (GLOVE) ×2
GLOVE ECLIPSE 8.0 STRL XLNG CF (GLOVE) ×6 IMPLANT
GLOVE SURG SS PI 6.5 STRL IVOR (GLOVE) ×3 IMPLANT
GOWN STRL REUS W/TWL LRG LVL3 (GOWN DISPOSABLE) ×7 IMPLANT
GOWN STRL REUS W/TWL XL LVL3 (GOWN DISPOSABLE) ×5 IMPLANT
HANDPIECE INTERPULSE COAX TIP (DISPOSABLE) ×3
HEMOSTAT SPONGE AVITENE ULTRA (HEMOSTASIS) ×3 IMPLANT
IMMOBILIZER KNEE 20 (SOFTGOODS) ×3
IMMOBILIZER KNEE 20 THIGH 36 (SOFTGOODS) ×1 IMPLANT
MANIFOLD NEPTUNE II (INSTRUMENTS) ×3 IMPLANT
NEEDLE HYPO 22GX1.5 SAFETY (NEEDLE) ×2 IMPLANT
PACK TOTAL KNEE CUSTOM (KITS) ×3 IMPLANT
PAD ABD 7.5X8 STRL (GAUZE/BANDAGES/DRESSINGS) ×2 IMPLANT
PAD ABD 8X10 STRL (GAUZE/BANDAGES/DRESSINGS) ×2 IMPLANT
PADDING CAST COTTON 6X4 STRL (CAST SUPPLIES) ×3 IMPLANT
PENCIL SMOKE EVAC W/HOLSTER (ELECTROSURGICAL) ×3 IMPLANT
POSITIONER SURGICAL ARM (MISCELLANEOUS) ×3 IMPLANT
SCRUB TECHNI CARE SURGICAL (MISCELLANEOUS) ×2 IMPLANT
SET HNDPC FAN SPRY TIP SCT (DISPOSABLE) ×1 IMPLANT
SET PAD KNEE POSITIONER (MISCELLANEOUS) ×3 IMPLANT
SPONGE LAP 18X18 X RAY DECT (DISPOSABLE) IMPLANT
STRIP CLOSURE SKIN 1/2X4 (GAUZE/BANDAGES/DRESSINGS) ×3 IMPLANT
SUT MNCRL AB 4-0 PS2 18 (SUTURE) ×3 IMPLANT
SUT VIC AB 1 CT1 27 (SUTURE) ×6
SUT VIC AB 1 CT1 27XBRD ANTBC (SUTURE) ×2 IMPLANT
SUT VIC AB 2-0 CT1 27 (SUTURE) ×9
SUT VIC AB 2-0 CT1 TAPERPNT 27 (SUTURE) ×3 IMPLANT
SUT VLOC 180 0 24IN GS25 (SUTURE) ×3 IMPLANT
SYR 20CC LL (SYRINGE) ×6 IMPLANT
TOWER CARTRIDGE SMART MIX (DISPOSABLE) ×3 IMPLANT
TRAY FOLEY W/METER SILVER 16FR (SET/KITS/TRAYS/PACK) ×3 IMPLANT
WRAP KNEE MAXI GEL POST OP (GAUZE/BANDAGES/DRESSINGS) ×3 IMPLANT
YANKAUER SUCT BULB TIP 10FT TU (MISCELLANEOUS) ×3 IMPLANT

## 2017-01-29 NOTE — Anesthesia Procedure Notes (Signed)
Date/Time: 01/29/2017 8:30 AM Performed by: Lissa Morales, CRNA Pre-anesthesia Checklist: Patient identified, Emergency Drugs available, Suction available, Patient being monitored and Timeout performed Patient Re-evaluated:Patient Re-evaluated prior to induction Oxygen Delivery Method: Simple face mask Placement Confirmation: positive ETCO2

## 2017-01-29 NOTE — Brief Op Note (Signed)
01/29/2017  10:18 AM  PATIENT:  Tony Patrick  67 y.o. male  PRE-OPERATIVE DIAGNOSIS:  left knee osteoarthritis and Flexion Contracture  POST-OPERATIVE DIAGNOSIS:  left knee osteoarthritis and Flexion Contracture  PROCEDURE:  Procedure(s) with comments: LEFT TOTAL KNEE ARTHROPLASTY (Left) - Adductor Blockand release of Flexion Contracture  SURGEON:  Surgeon(s) and Role:    Latanya Maudlin, MD - Primary  PHYSICIAN ASSISTANT: Ardeen Jourdain PA  ASSISTANTS: AmbeConstable PA  ANESTHESIA:   spinal and Adductor Block on the Left  EBL:  50 mL   BLOOD ADMINISTERED:none  DRAINS: (one) Hemovact drain(s) in the Left Knee with  Suction Open   LOCAL MEDICATIONS USED:  OTHER 20cc of Exparel mixed with 20cc of Normal Saline  SPECIMEN:  No Specimen  DISPOSITION OF SPECIMEN:  N/A  COUNTS:  YES  TOURNIQUET:  * Missing tourniquet times found for documented tourniquets in log: [id number] *  DICTATION: .Other Dictation: Dictation Number [id number]  PLAN OF CARE: Admit to inpatient   PATIENT DISPOSITION:  Stable in OR   Delay start of Pharmacological VTE agent (>24hrs) due to surgical blood loss or risk of bleeding: yes

## 2017-01-29 NOTE — Interval H&P Note (Signed)
History and Physical Interval Note:  01/29/2017 8:24 AM  Tony Patrick  has presented today for surgery, with the diagnosis of left knee osteoarthritis  The various methods of treatment have been discussed with the patient and family. After consideration of risks, benefits and other options for treatment, the patient has consented to  Procedure(s): LEFT TOTAL KNEE ARTHROPLASTY (Left) as a surgical intervention .  [name] history has been reviewed, patient examined, no change in status, stable for surgery.  I have reviewed the patient's chart and labs.  Questions were answered to the patient's satisfaction.     Tony Patrick

## 2017-01-29 NOTE — Transfer of Care (Signed)
Immediate Anesthesia Transfer of Care Note  Patient: Tony Patrick  Procedure(s) Performed: LEFT TOTAL KNEE ARTHROPLASTY (Left Knee)  Patient Location: PACU  Anesthesia Type:Spinal  Level of Consciousness: awake, alert , oriented and patient cooperative  Airway & Oxygen Therapy: Patient Spontanous Breathing and Patient connected to face mask oxygen  Post-op Assessment: Report given to RN and Post -op Vital signs reviewed and stable  Post vital signs: stable  Last Vitals:  Vitals:   01/29/17 0823 01/29/17 0824  BP:    Pulse: 61 62  Resp: 12 13  Temp:    SpO2: 97% 97%    Last Pain:  Vitals:   01/29/17 0822  TempSrc:   PainSc: 0-No pain         Complications: No apparent anesthesia complications  H72 spinal level

## 2017-01-29 NOTE — Anesthesia Postprocedure Evaluation (Signed)
Anesthesia Post Note  Patient: Tony Patrick  Procedure(s) Performed: LEFT TOTAL KNEE ARTHROPLASTY (Left Knee)     Patient location during evaluation: PACU Anesthesia Type: Spinal and Regional Level of consciousness: awake and alert Pain management: pain level controlled Vital Signs Assessment: post-procedure vital signs reviewed and stable Respiratory status: spontaneous breathing, respiratory function stable and patient connected to nasal cannula oxygen Cardiovascular status: blood pressure returned to baseline and stable Postop Assessment: spinal receding Anesthetic complications: no    Last Vitals:  Vitals:   01/29/17 1300 01/29/17 1325  BP: (!) 141/97 (!) 141/91  Pulse: (!) 59 64  Resp: 12 14  Temp: 36.8 C (!) 36.4 C  SpO2: 98% 98%    Last Pain:  Vitals:   01/29/17 1230  TempSrc:   PainSc: 3                  Taia Bramlett,W. EDMOND

## 2017-01-29 NOTE — Progress Notes (Signed)
Assisted Dr. Edmond Fitzgerald with left, ultrasound guided, adductor canal block. Side rails up, monitors on throughout procedure. See vital signs in flow sheet. Tolerated Procedure well. 

## 2017-01-29 NOTE — Anesthesia Procedure Notes (Signed)
Anesthesia Regional Block: Adductor canal block   Pre-Anesthetic Checklist: ,, timeout performed, Correct Patient, Correct Site, Correct Laterality, Correct Procedure, Correct Position, site marked, Risks and benefits discussed, pre-op evaluation,  At surgeon's request and post-op pain management  Laterality: Left  Prep: Maximum Sterile Barrier Precautions used, chloraprep       Needles:  Injection technique: Single-shot  Needle Type: Echogenic Stimulator Needle     Needle Length: 9cm  Needle Gauge: 21     Additional Needles:   Procedures:,,,, ultrasound used (permanent image in chart),,,,  Narrative:  Start time: 01/29/2017 8:05 AM End time: 01/29/2017 8:15 AM Injection made incrementally with aspirations every 5 mL.  Performed by: Personally  Anesthesiologist: Roderic Palau, MD  Additional Notes: 2% Lidocaine skin wheel.

## 2017-01-29 NOTE — Anesthesia Procedure Notes (Signed)
Spinal  Patient location during procedure: OR End time: 01/29/2017 8:38 AM Staffing Resident/CRNA: Lissa Morales, CRNA Performed: resident/CRNA  Preanesthetic Checklist Completed: patient identified, site marked, surgical consent, pre-op evaluation, timeout performed, IV checked, risks and benefits discussed and monitors and equipment checked Spinal Block Patient position: sitting Prep: ChloraPrep Patient monitoring: heart rate, continuous pulse ox and blood pressure Approach: midline Location: L3-4 Injection technique: single-shot Needle Needle type: Pencan  Needle gauge: 24 G Needle length: 9 cm Assessment Sensory level: T6 Additional Notes Expiration date of kit checked and confirmed. Patient tolerated procedure well, without complications.

## 2017-01-30 ENCOUNTER — Encounter (HOSPITAL_COMMUNITY): Payer: Self-pay | Admitting: *Deleted

## 2017-01-30 LAB — BASIC METABOLIC PANEL
ANION GAP: 5 (ref 5–15)
BUN: 17 mg/dL (ref 6–20)
CALCIUM: 8.4 mg/dL — AB (ref 8.9–10.3)
CO2: 26 mmol/L (ref 22–32)
Chloride: 107 mmol/L (ref 101–111)
Creatinine, Ser: 0.94 mg/dL (ref 0.61–1.24)
GFR calc Af Amer: >60 mL/min (ref >60)
Glucose, Bld: 118 mg/dL — ABNORMAL HIGH (ref 65–99)
POTASSIUM: 4 mmol/L (ref 3.5–5.1)
SODIUM: 138 mmol/L (ref 135–145)

## 2017-01-30 LAB — CBC
HCT: 39.2 % (ref 39.0–52.0)
Hemoglobin: 12.7 g/dL — ABNORMAL LOW (ref 13.0–17.0)
MCH: 30 pg (ref 26.0–34.0)
MCHC: 32.4 g/dL (ref 30.0–36.0)
MCV: 92.7 fL (ref 78.0–100.0)
PLATELETS: 142 10*3/uL — AB (ref 150–400)
RBC: 4.23 MIL/uL (ref 4.22–5.81)
RDW: 14.2 % (ref 11.5–15.5)
WBC: 10.3 10*3/uL (ref 4.0–10.5)

## 2017-01-30 MED ORDER — ASPIRIN EC 325 MG PO TBEC: 325.0000 mg | DELAYED_RELEASE_TABLET | Freq: Two times a day (BID) | ORAL | 0 refills | Status: DC

## 2017-01-30 MED ORDER — METHOCARBAMOL 500 MG PO TABS: 500.0000 mg | ORAL_TABLET | Freq: Four times a day (QID) | ORAL | 1 refills | Status: DC | PRN

## 2017-01-30 MED ORDER — HYDROCODONE-ACETAMINOPHEN 10-325 MG PO TABS: 1.0000 | ORAL_TABLET | ORAL | 0 refills | Status: DC | PRN

## 2017-01-30 MED ORDER — DOCUSATE SODIUM 100 MG PO CAPS
100.0000 mg | ORAL_CAPSULE | Freq: Two times a day (BID) | ORAL | Status: DC
  Administered 2017-01-30 – 2017-01-31 (×3): 100 mg via ORAL
  Filled 2017-01-30 (×3): qty 1

## 2017-01-30 MED ORDER — POLYETHYLENE GLYCOL 3350 17 G PO PACK: 17.0000 g | PACK | Freq: Every day | ORAL | Status: DC | PRN

## 2017-01-30 NOTE — Progress Notes (Signed)
Subjective: 1 Day Post-Op Procedure(s) (LRB): LEFT TOTAL KNEE ARTHROPLASTY (Left) Patient reports pain as 3 on 0-10 scale. Doing very well today.Dressing changed and drain pulled.Wound looks fine   Objective: Vital signs in last 24 hours: Temp:  [97.5 F (36.4 C)-99.1 F (37.3 C)] 97.8 F (36.6 C) (01/17 0600) Pulse Rate:  [50-79] 65 (01/17 0600) Resp:  [11-27] 17 (01/17 0600) BP: (107-159)/(69-137) 125/72 (01/17 0600) SpO2:  [92 %-100 %] 97 % (01/17 0600) FiO2 (%):  [40 %] 40 % (01/16 0822) Weight:  [111.6 kg (246 lb)] 111.6 kg (246 lb) (01/16 1325)  Intake/Output from previous day: 01/16 0701 - 01/17 0700 In: 4853.3 [P.O.:980; I.V.:3818.3; IV Piggyback:55] Out: 2895 [Urine:2425; Drains:370; Blood:100] Intake/Output this shift: No intake/output data recorded.  Recent Labs    01/30/17 0550  HGB 12.7*   Recent Labs    01/30/17 0550  WBC 10.3  RBC 4.23  HCT 39.2  PLT 142*   Recent Labs    01/30/17 0550  NA 138  K 4.0  CL 107  CO2 26  BUN 17  CREATININE 0.94  GLUCOSE 118*  CALCIUM 8.4*   No results for input(s): LABPT, INR in the last 72 hours.  Dorsiflexion/Plantar flexion intact No cellulitis present Compartment soft  Assessment/Plan: 1 Day Post-Op Procedure(s) (LRB): LEFT TOTAL KNEE ARTHROPLASTY (Left) Up with therapy  Latanya Maudlin 01/30/2017, 7:31 AM

## 2017-01-30 NOTE — Evaluation (Signed)
Physical Therapy Evaluation Patient Details Name: Tony Patrick MRN: [id number] DOB: [birth year] Today's Date: 01/30/2017   History of Present Illness  s/p L TKA  Clinical Impression  Pt s/p L TKR and presents with decreased L LE strength/ROM and post op pain limiting functional mobility.  Pt should progress to dc home with family assist.    Follow Up Recommendations DC plan and follow up therapy as arranged by surgeon    Equipment Recommendations  None recommended by PT    Recommendations for Other Services       Precautions / Restrictions Precautions Precautions: Fall;Knee Required Braces or Orthoses: Knee Immobilizer - Left Knee Immobilizer - Left: Discontinue once straight leg raise with < 10 degree lag Restrictions Weight Bearing Restrictions: No Other Position/Activity Restrictions: wbat      Mobility  Bed Mobility Overal bed mobility: Needs Assistance Bed Mobility: Supine to Sit     Supine to sit: Min assist     General bed mobility comments: cues for sequence and use of R LE to self assist  Transfers Overall transfer level: Needs assistance Equipment used: Rolling walker (2 wheeled) Transfers: Sit to/from Stand Sit to Stand: Min assist;Min guard         General transfer comment: cues for LE management and use of UEs to self assist  Ambulation/Gait Ambulation/Gait assistance: Min assist;Min guard Ambulation Distance (Feet): 100 Feet Assistive device: Rolling walker (2 wheeled) Gait Pattern/deviations: Step-to pattern;Decreased step length - right;Decreased step length - left;Shuffle;Trunk flexed Gait velocity: decr Gait velocity interpretation: Below normal speed for age/gender General Gait Details: cues for sequence, posture and position from [employer]            Wheelchair Mobility    Modified Rankin (Stroke Patients Only)       Balance                                             Pertinent Vitals/Pain Pain  Assessment: 0-10 Pain Score: 4  Pain Location: l knee Pain Descriptors / Indicators: Sore;Aching Pain Intervention(s): Limited activity within patient's tolerance;Monitored during session;Premedicated before session;Ice applied    Home Living Family/patient expects to be discharged to:: Private residence Living Arrangements: Spouse/significant other Available Help at Discharge: Family Type of Home: House Home Access: Stairs to enter   Technical brewer of Steps: 1 Home Layout: One level Home Equipment: Environmental consultant - 2 wheels;Cane - single point Additional Comments: Pt states Workers Comp sent him a Corporate treasurer Level of Independence: Independent               Journalist, newspaper        Extremity/Trunk Assessment   Upper Extremity Assessment Upper Extremity Assessment: Overall WFL for tasks assessed    Lower Extremity Assessment Lower Extremity Assessment: LLE deficits/detail LLE Deficits / Details: 2/5 quads with AAROM at knee -10 - 45    Cervical / Trunk Assessment Cervical / Trunk Assessment: Normal  Communication   Communication: No difficulties  Cognition Arousal/Alertness: Awake/alert Behavior During Therapy: WFL for tasks assessed/performed Overall Cognitive Status: Within Functional Limits for tasks assessed                                        General Comments  Exercises Total Joint Exercises Ankle Circles/Pumps: AROM;Both;20 reps;Supine Quad Sets: AROM;Both;10 reps;Supine Heel Slides: AAROM;Left;15 reps;Supine Straight Leg Raises: AAROM;Left;10 reps;Supine   Assessment/Plan    PT Assessment Patient needs continued PT services  PT Problem List Decreased strength;Decreased range of motion;Decreased activity tolerance;Decreased mobility;Decreased knowledge of use of DME;Pain       PT Treatment Interventions DME instruction;Gait training;Stair training;Functional mobility training;Therapeutic exercise;Therapeutic  activities;Patient/family education    PT Goals (Current goals can be found in the Care Plan section)  Acute Rehab PT Goals Patient Stated Goal: Regain IND  PT Goal Formulation: With patient Time For Goal Achievement: 02/01/17 Potential to Achieve Goals: Good    Frequency 7X/week   Barriers to discharge        Co-evaluation               AM-PAC PT "6 Clicks" Daily Activity  Outcome Measure Difficulty turning over in bed (including adjusting bedclothes, sheets and blankets)?: A Lot Difficulty moving from lying on back to sitting on the side of the bed? : A Lot Difficulty sitting down on and standing up from a chair with arms (e.g., wheelchair, bedside commode, etc,.)?: A Lot Help needed moving to and from a bed to chair (including a wheelchair)?: A Little Help needed walking in hospital room?: A Little Help needed climbing 3-5 steps with a railing? : A Little 6 Click Score: 15    End of Session Equipment Utilized During Treatment: Gait belt;Left knee immobilizer Activity Tolerance: Patient tolerated treatment well Patient left: in chair;with call bell/phone within reach;with family/visitor present Nurse Communication: Mobility status PT Visit Diagnosis: Difficulty in walking, not elsewhere classified (R26.2)    Time: 0950-1020 PT Time Calculation (min) (ACUTE ONLY): 30 min   Charges:   PT Evaluation $PT Eval Low Complexity: 1 Low PT Treatments $Therapeutic Exercise: 8-22 mins   PT G Codes:        Pg [phone]   Jcion Buddenhagen 01/30/2017, 4:16 PM

## 2017-01-30 NOTE — Progress Notes (Signed)
Spoke with patient and spouse at bedside. Confirmed plan for OP PT, already arranged. Has RW, declines 3n1. Per patient and spouse this encounter should be under [place] #: [id number]       Date of injury: 03-27-15       Adjuster and/or Nurse Case Manager name, phone and fax number: Quincy Simmonds [phone] Contacted Admissions to update [phone]

## 2017-01-30 NOTE — Discharge Instructions (Signed)

## 2017-01-30 NOTE — Op Note (Signed)
NAME:  SULTAN, PARGAS                    ACCOUNT NO.:  MEDICAL RECORD NO.:  [id number]  LOCATION:                                 FACILITY:  PHYSICIAN:  Rhet Rorke A. Sueann Brownley, M.D.DATE OF BIRTH:  19-Jul-1950  DATE OF PROCEDURE:  01/29/2017 DATE OF DISCHARGE:                              OPERATIVE REPORT   SURGEON:  Kipp Brood. Gladstone Lighter, M.D.  ASSISTANT:  Ardeen Jourdain, [place]  PREOPERATIVE DIAGNOSES: 1. Traumatic arthritis, left knee. 2. Flexion contracture, left knee.  POSTOPERATIVE DIAGNOSES: 1. Traumatic arthritis, left knee. 2. Flexion contracture, left knee.  OPERATIONS: 1. Release of the flexion contracture, left knee. 2. Left total knee arthroplasty utilizing the DePuy system.  All three     components were cemented and gentamicin was used in the cement.     The size of the femoral component was a size 3 left.  The tibial     tray was a size 4.  The insert was a size 3, 12.5-mm thickness.     Patella button was a size 41.  We utilized the rotating platform     polyethylene insert for the tibia.  DESCRIPTION OF PROCEDURE:  Under spinal anesthesia, routine orthopedic prep and draping, left lower extremity was carried out.  Prior to the anesthesia, the patient had an adductor block by Anesthesia in the holding area.  The appropriate time-out was carried out.  I also marked the appropriate left leg in the holding area.  At this time, after sterile prep and draping and time-out, the leg was exsanguinated with an Esmarch.  Tourniquet was elevated to 325 mmHg.  The knee then was placed in the [facility] knee holder.  The knee was flexed.  Anterior approach to the knee was carried out.  She had 2 g of IV Ancef.  Two flaps were created.  I then carried out a median parapatellar incision.  I reflected the patella laterally.  I did medial and lateral meniscectomies, and excised the anterior and posterior cruciate ligaments.  After this was done, we then made our initial drill hole in the  intercondylar notch region.  The guide rod was inserted up into the femoral canal to make sure we were properly in the canal and we were. At this time, we then removed that and thoroughly irrigated out the femoral canal.  I then removed 12 mm thickness off the distal femur at this point.  Following that, we measured the femur to be a size 3.  We did the appropriate anterior, posterior and chamfering cuts for a size 3 femoral component.  After the finger was prepared, we then went onto the tibia.  We utilized the saw to remove the spinous processes of tibia. We then measured the tibia to be a size 4 tray.  We then inserted our external guide and removed the tibial plateau utilizing 4 mm thickness cut and we used as a reference point to medial tibial plateau.  After this was done, we then went back and just checked our tray size once again and was a 4.  We then inserted our lamina spreaders and made sure we had no posterior spurs.  We  did not.  Then, we inserted our spacer blocks and elected to use a 12.5-mm thickness block.  Following that, we then went on to prepare the tibial plateau by making our keel cut in the tibial plateau.  I then utilized the femoral notch cut.  I then inserted the trial instrument.  First, we utilized the size 3 left femoral component, the size 4 tibial tray with an insert, 12.5-mm size 3.  We had a nice stable reduction.  I had to do a good medial release at this time because he was somewhat tight from his varus deformity medially. Following that, we then did a resurfacing procedure on the patella for a size 41-mm patella.  We made three drill holes in the articular surface of the patella.  All components then were removed.  We thoroughly water picked out the knee and cemented all three components in simultaneously with gentamicin in the cement.  At this time, after the cement was hardened, we went through range of motion again and we finally elected to utilize  the 12.5-mm thickness tibial insert.  At this particular time after we removed all loose pieces of cement, we finally inserted our permanent size 3 12-mm thickness insert.  We reduced the knee and had excellent function.  At this particular point, we irrigated the knee out again with antibiotic solution.  I inserted a Hemovac drain and the wound was closed in layers over Hemovac drain in usual fashion.  Sterile dressings were applied.  He will be admitted and kept on an anticoagulant at this particular time.          ______________________________ Kipp Brood Gladstone Lighter, M.D.     RAG/MEDQ  D:  01/29/2017  T:  01/30/2017  Job:  [id number]

## 2017-01-30 NOTE — Evaluation (Signed)
Occupational Therapy Evaluation Patient Details Name: Tony Patrick MRN: [id number] DOB: Nov 15, 1950 Today's Date: 01/30/2017    History of Present Illness s/p L TKA   Clinical Impression   Pt was admitted for the above. H/o RCR.  Pt needs min guard for mobility and min to mod A for LB ads. Wife will assist him at home as needed. All education was completed. No further OT needs    Follow Up Recommendations  No OT follow up;Supervision/Assistance - 24 hour    Equipment Recommendations  None recommended by OT    Recommendations for Other Services       Precautions / Restrictions Precautions Precautions: Fall;Knee Required Braces or Orthoses: Knee Immobilizer - Left Restrictions Other Position/Activity Restrictions: wbat      Mobility Bed Mobility                  Transfers Overall transfer level: Needs assistance Equipment used: Rolling walker (2 wheeled) Transfers: Sit to/from Stand Sit to Stand: Min guard         General transfer comment: for safety    Balance                                           ADL either performed or assessed with clinical judgement   ADL Overall ADL's : Needs assistance/impaired             Lower Body Bathing: Minimal assistance;Sit to/from stand       Lower Body Dressing: Moderate assistance;Sit to/from stand   Toilet Transfer: Min guard;RW;Comfort height toilet;Ambulation   Toileting- Clothing Manipulation and Hygiene: Min guard;Sit to/from stand         General ADL Comments: ambulated to bathroom; stood to use urinal. Practiced comfort height commode transfer; pt used grab bar to simulate sink.  Reviewed tub readiness. Pt will sponge bathe initially     Vision         Perception     Praxis      Pertinent Vitals/Pain Pain Assessment: 0-10 Pain Score: 4  Pain Location: l knee Pain Descriptors / Indicators: Sore Pain Intervention(s): Limited activity within patient's  tolerance;Monitored during session;Premedicated before session;Repositioned;Ice applied     Hand Dominance     Extremity/Trunk Assessment Upper Extremity Assessment Upper Extremity Assessment: Overall WFL for tasks assessed           Communication Communication Communication: No difficulties   Cognition Arousal/Alertness: Awake/alert Behavior During Therapy: WFL for tasks assessed/performed Overall Cognitive Status: Within Functional Limits for tasks assessed                                     General Comments       Exercises     Shoulder Instructions      Home Living Family/patient expects to be discharged to:: Private residence Living Arrangements: Spouse/significant other                 Bathroom Shower/Tub: Teacher, early years/pre: Handicapped height         Additional Comments: sink next to commode      Prior Functioning/Environment Level of Independence: Independent                 OT Problem List:        OT Treatment/Interventions:  OT Goals(Current goals can be found in the care plan section) Acute Rehab OT Goals OT Goal Formulation: All assessment and education complete, DC therapy  OT Frequency:     Barriers to D/C:            Co-evaluation              AM-PAC PT "6 Clicks" Daily Activity     Outcome Measure Help from another person eating meals?: None Help from another person taking care of personal grooming?: A Little Help from another person toileting, which includes using toliet, bedpan, or urinal?: A Little Help from another person bathing (including washing, rinsing, drying)?: A Little Help from another person to put on and taking off regular upper body clothing?: A Little Help from another person to put on and taking off regular lower body clothing?: A Lot 6 Click Score: 18   End of Session    Activity Tolerance: Patient tolerated treatment well Patient left: in chair;with call  bell/phone within reach;with family/visitor present  OT Visit Diagnosis: Pain Pain - Right/Left: Left Pain - part of body: Knee                Time: 2841-3244 OT Time Calculation (min): 16 min Charges:  OT General Charges $OT Visit: 1 Visit OT Evaluation $OT Eval Low Complexity: 1 Low G-Codes:     [place], OTR/L [phone] 01/30/2017  Tony Patrick 01/30/2017, 12:02 PM

## 2017-01-31 LAB — CBC
HEMATOCRIT: 39.4 % (ref 39.0–52.0)
HEMOGLOBIN: 12.9 g/dL — AB (ref 13.0–17.0)
MCH: 29.9 pg (ref 26.0–34.0)
MCHC: 32.7 g/dL (ref 30.0–36.0)
MCV: 91.4 fL (ref 78.0–100.0)
Platelets: 162 10*3/uL (ref 150–400)
RBC: 4.31 MIL/uL (ref 4.22–5.81)
RDW: 14 % (ref 11.5–15.5)
WBC: 11.5 10*3/uL — ABNORMAL HIGH (ref 4.0–10.5)

## 2017-01-31 LAB — BASIC METABOLIC PANEL
ANION GAP: 4 — AB (ref 5–15)
BUN: 16 mg/dL (ref 6–20)
CALCIUM: 8.7 mg/dL — AB (ref 8.9–10.3)
CHLORIDE: 108 mmol/L (ref 101–111)
CO2: 26 mmol/L (ref 22–32)
CREATININE: 0.87 mg/dL (ref 0.61–1.24)
GFR calc non Af Amer: >60 mL/min (ref >60)
Glucose, Bld: 123 mg/dL — ABNORMAL HIGH (ref 65–99)
Potassium: 4 mmol/L (ref 3.5–5.1)
SODIUM: 138 mmol/L (ref 135–145)

## 2017-01-31 NOTE — Progress Notes (Signed)
Physical Therapy Treatment Patient Details Name: Tony Patrick MRN: [id number] DOB: 06-11-1950 Today's Date: 01/31/2017    History of Present Illness s/p L TKA    PT Comments    Therex program performed with written instruction provided for home.    Follow Up Recommendations  DC plan and follow up therapy as arranged by surgeon     Equipment Recommendations  None recommended by PT    Recommendations for Other Services       Precautions / Restrictions Precautions Precautions: Fall;Knee Required Braces or Orthoses: Knee Immobilizer - Left Knee Immobilizer - Left: Discontinue once straight leg raise with < 10 degree lag Restrictions Weight Bearing Restrictions: No Other Position/Activity Restrictions: wbat    Mobility  Bed Mobility                  Transfers                    Ambulation/Gait                 Stairs            Wheelchair Mobility    Modified Rankin (Stroke Patients Only)       Balance                                            Cognition Arousal/Alertness: Awake/alert Behavior During Therapy: WFL for tasks assessed/performed Overall Cognitive Status: Within Functional Limits for tasks assessed                                        Exercises Total Joint Exercises Ankle Circles/Pumps: AROM;Both;20 reps;Supine Quad Sets: AROM;Both;10 reps;Supine Short Arc Quad: AAROM;AROM;Left;Supine;10 reps Heel Slides: AAROM;Left;Supine;20 reps Straight Leg Raises: AAROM;Left;Supine;20 reps Knee Flexion: AAROM;Left;15 reps;Seated Goniometric ROM: AAROM at L knee -10 - 40    General Comments        Pertinent Vitals/Pain Pain Assessment: 0-10 Pain Score: 5  Pain Location: l knee Pain Descriptors / Indicators: Sore;Aching Pain Intervention(s): Limited activity within patient's tolerance;Monitored during session;Premedicated before session    Home Living                      Prior Function            PT Goals (current goals can now be found in the care plan section) Acute Rehab PT Goals Patient Stated Goal: Regain IND  PT Goal Formulation: With patient Time For Goal Achievement: 02/01/17 Potential to Achieve Goals: Good Progress towards PT goals: Progressing toward goals    Frequency    7X/week      PT Plan Current plan remains appropriate    Co-evaluation              AM-PAC PT "6 Clicks" Daily Activity  Outcome Measure  Difficulty turning over in bed (including adjusting bedclothes, sheets and blankets)?: A Lot Difficulty moving from lying on back to sitting on the side of the bed? : A Lot Difficulty sitting down on and standing up from a chair with arms (e.g., wheelchair, bedside commode, etc,.)?: A Lot Help needed moving to and from a bed to chair (including a wheelchair)?: A Little Help needed walking in hospital room?: A Little Help needed climbing 3-5 steps with a  railing? : A Little 6 Click Score: 15    End of Session   Activity Tolerance: Patient tolerated treatment well Patient left: in bed;with call bell/phone within reach;with family/visitor present   PT Visit Diagnosis: Difficulty in walking, not elsewhere classified (R26.2)     Time: 0459-9774 PT Time Calculation (min) (ACUTE ONLY): 25 min  Charges:  $Therapeutic Exercise: 23-37 mins                    G Codes:       Pg [phone]    Quan Cybulski 01/31/2017, 12:25 PM

## 2017-01-31 NOTE — Discharge Summary (Signed)
Physician Discharge Summary   Patient ID: Tony Patrick MRN: [id number] DOB/AGE: 05-28-1950 67 y.o.  Admit date: 01/29/2017 Discharge date: 01/31/2017  Primary Diagnosis: Post-traumatic osteoarthritis left knee  Admission Diagnoses:  Past Medical History:  Diagnosis Date  . Arthritis   . Atherosclerotic plaque   . Basal cell carcinoma   . CAD (coronary artery disease)    Anterior MI, [facility], 1995, no PCI, total LAD  / catheterization 2001, 30% LAD beyond the origin of a large diagonal, circumflex normal, RCA normal, anterolateral and apical  akinesis, ejection fraction 25-35% range  . Dyslipidemia   . Ejection fraction < 50%    EF 25-35%, catheterization 2001 / EF 20-25%, global hypokinesis, echo in June, 2012  . GERD (gastroesophageal reflux disease)   . History of bronchitis   . Hypoxia    Pneumonia, hospitalization, June, 2012  . Kidney stones 1990's  . MI (myocardial infarction) ([place]) 1995  . Pneumonia 2012  . S/P colectomy    Benign tumor   Discharge Diagnoses:   Active Problems:   Hx of total knee arthroplasty, right  Estimated body mass index is 36.33 kg/m as calculated from the following:   Height as of this encounter: _0  (1.753 m).   Weight as of this encounter: 111.6 kg (246 lb).  Procedure:  Procedure(s) (LRB): LEFT TOTAL KNEE ARTHROPLASTY (Left)   Consults: None  HPI: Tony Patrick, 67 y.o. male, has a history of pain and functional disability in the left knee due to trauma and arthritis and has failed non-surgical conservative treatments for greater than 12 weeks to includeNSAID's and/or analgesics, corticosteriod injections, viscosupplementation injections, use of assistive devices and activity modification.  Onset of symptoms was gradual, starting 2 years ago with gradually worsening course since that time. The patient noted prior procedures on the knee to include  arthroscopy and menisectomy on the left knee(s).  Patient currently rates pain in the left  knee(s) at 8 out of 10 with activity. Patient has night pain, worsening of pain with activity and weight bearing, pain that interferes with activities of daily living, pain with passive range of motion, crepitus and joint swelling.  Patient has evidence of periarticular osteophytes and joint space narrowing by imaging studies. There is no active infection.    Laboratory Data: Admission on 01/29/2017, Discharged on 01/31/2017  Component Date Value Ref Range Status  . WBC 01/30/2017 10.3  4.0 - 10.5 K/uL Final  . RBC 01/30/2017 4.23  4.22 - 5.81 MIL/uL Final  . Hemoglobin 01/30/2017 12.7* 13.0 - 17.0 g/dL Final  . HCT 01/30/2017 39.2  39.0 - 52.0 % Final  . MCV 01/30/2017 92.7  78.0 - 100.0 fL Final  . MCH 01/30/2017 30.0  26.0 - 34.0 pg Final  . MCHC 01/30/2017 32.4  30.0 - 36.0 g/dL Final  . RDW 01/30/2017 14.2  11.5 - 15.5 % Final  . Platelets 01/30/2017 142* 150 - 400 K/uL Final  . Sodium 01/30/2017 138  135 - 145 mmol/L Final  . Potassium 01/30/2017 4.0  3.5 - 5.1 mmol/L Final  . Chloride 01/30/2017 107  101 - 111 mmol/L Final  . CO2 01/30/2017 26  22 - 32 mmol/L Final  . Glucose, Bld 01/30/2017 118* 65 - 99 mg/dL Final  . BUN 01/30/2017 17  6 - 20 mg/dL Final  . Creatinine, Ser 01/30/2017 0.94  0.61 - 1.24 mg/dL Final  . Calcium 01/30/2017 8.4* 8.9 - 10.3 mg/dL Final  . GFR calc non Af Amer 01/30/2017 >60  >  60 mL/min Final  . GFR calc Af Amer 01/30/2017 >60  >60 mL/min Final   Comment: (NOTE) The eGFR has been calculated using the CKD EPI equation. This calculation has not been validated in all clinical situations. eGFR's persistently <60 mL/min signify possible Chronic Kidney Disease.   . Anion gap 01/30/2017 5  5 - 15 Final  . WBC 01/31/2017 11.5* 4.0 - 10.5 K/uL Final  . RBC 01/31/2017 4.31  4.22 - 5.81 MIL/uL Final  . Hemoglobin 01/31/2017 12.9* 13.0 - 17.0 g/dL Final  . HCT 01/31/2017 39.4  39.0 - 52.0 % Final  . MCV 01/31/2017 91.4  78.0 - 100.0 fL Final  . MCH  01/31/2017 29.9  26.0 - 34.0 pg Final  . MCHC 01/31/2017 32.7  30.0 - 36.0 g/dL Final  . RDW 01/31/2017 14.0  11.5 - 15.5 % Final  . Platelets 01/31/2017 162  150 - 400 K/uL Final  . Sodium 01/31/2017 138  135 - 145 mmol/L Final  . Potassium 01/31/2017 4.0  3.5 - 5.1 mmol/L Final  . Chloride 01/31/2017 108  101 - 111 mmol/L Final  . CO2 01/31/2017 26  22 - 32 mmol/L Final  . Glucose, Bld 01/31/2017 123* 65 - 99 mg/dL Final  . BUN 01/31/2017 16  6 - 20 mg/dL Final  . Creatinine, Ser 01/31/2017 0.87  0.61 - 1.24 mg/dL Final  . Calcium 01/31/2017 8.7* 8.9 - 10.3 mg/dL Final  . GFR calc non Af Amer 01/31/2017 >60  >60 mL/min Final  . GFR calc Af Amer 01/31/2017 >60  >60 mL/min Final   Comment: (NOTE) The eGFR has been calculated using the CKD EPI equation. This calculation has not been validated in all clinical situations. eGFR's persistently <60 mL/min signify possible Chronic Kidney Disease.   Georgiann Hahn gap 01/31/2017 4* 5 - 15 Final  Hospital Outpatient Visit on 01/20/2017  Component Date Value Ref Range Status  . MRSA, PCR 01/20/2017 NEGATIVE  NEGATIVE Final  . Staphylococcus aureus 01/20/2017 POSITIVE* NEGATIVE Final   Comment: (NOTE) The Xpert SA Assay (FDA approved for NASAL specimens in patients 60 years of age and older), is one component of a comprehensive surveillance program. It is not intended to diagnose infection nor to guide or monitor treatment.   Marland Kitchen aPTT 01/20/2017 27  24 - 36 seconds Final  . WBC 01/20/2017 9.2  4.0 - 10.5 K/uL Final  . RBC 01/20/2017 5.03  4.22 - 5.81 MIL/uL Final  . Hemoglobin 01/20/2017 15.4  13.0 - 17.0 g/dL Final  . HCT 01/20/2017 46.7  39.0 - 52.0 % Final  . MCV 01/20/2017 92.8  78.0 - 100.0 fL Final  . MCH 01/20/2017 30.6  26.0 - 34.0 pg Final  . MCHC 01/20/2017 33.0  30.0 - 36.0 g/dL Final  . RDW 01/20/2017 14.4  11.5 - 15.5 % Final  . Platelets 01/20/2017 163  150 - 400 K/uL Final  . Neutrophils Relative % 01/20/2017 69  % Final  .  Neutro Abs 01/20/2017 6.3  1.7 - 7.7 K/uL Final  . Lymphocytes Relative 01/20/2017 22  % Final  . Lymphs Abs 01/20/2017 2.0  0.7 - 4.0 K/uL Final  . Monocytes Relative 01/20/2017 7  % Final  . Monocytes Absolute 01/20/2017 0.7  0.1 - 1.0 K/uL Final  . Eosinophils Relative 01/20/2017 2  % Final  . Eosinophils Absolute 01/20/2017 0.2  0.0 - 0.7 K/uL Final  . Basophils Relative 01/20/2017 0  % Final  . Basophils Absolute 01/20/2017 0.0  0.0 - 0.1 K/uL Final  . Sodium 01/20/2017 140  135 - 145 mmol/L Final  . Potassium 01/20/2017 4.1  3.5 - 5.1 mmol/L Final  . Chloride 01/20/2017 109  101 - 111 mmol/L Final  . CO2 01/20/2017 25  22 - 32 mmol/L Final  . Glucose, Bld 01/20/2017 111* 65 - 99 mg/dL Final  . BUN 01/20/2017 22* 6 - 20 mg/dL Final  . Creatinine, Ser 01/20/2017 0.90  0.61 - 1.24 mg/dL Final  . Calcium 01/20/2017 8.9  8.9 - 10.3 mg/dL Final  . Total Protein 01/20/2017 6.6  6.5 - 8.1 g/dL Final  . Albumin 01/20/2017 3.8  3.5 - 5.0 g/dL Final  . AST 01/20/2017 18  15 - 41 U/L Final  . ALT 01/20/2017 16* 17 - 63 U/L Final  . Alkaline Phosphatase 01/20/2017 62  38 - 126 U/L Final  . Total Bilirubin 01/20/2017 1.2  0.3 - 1.2 mg/dL Final  . GFR calc non Af Amer 01/20/2017 >60  >60 mL/min Final  . GFR calc Af Amer 01/20/2017 >60  >60 mL/min Final   Comment: (NOTE) The eGFR has been calculated using the CKD EPI equation. This calculation has not been validated in all clinical situations. eGFR's persistently <60 mL/min signify possible Chronic Kidney Disease.   . Anion gap 01/20/2017 6  5 - 15 Final  . Prothrombin Time 01/20/2017 12.7  11.4 - 15.2 seconds Final  . INR 01/20/2017 0.96   Final  . ABO/RH(D) 01/20/2017 O NEG   Final  . Antibody Screen 01/20/2017 NEG   Final  . Sample Expiration 01/20/2017 02/01/2017   Final  . Extend sample reason 01/20/2017 NO TRANSFUSIONS OR PREGNANCY IN THE PAST 3 MONTHS   Final  . Color, Urine 01/20/2017 YELLOW  YELLOW Final  . APPearance 01/20/2017  CLEAR  CLEAR Final  . Specific Gravity, Urine 01/20/2017 1.021  1.005 - 1.030 Final  . pH 01/20/2017 5.0  5.0 - 8.0 Final  . Glucose, UA 01/20/2017 NEGATIVE  NEGATIVE mg/dL Final  . Hgb urine dipstick 01/20/2017 NEGATIVE  NEGATIVE Final  . Bilirubin Urine 01/20/2017 NEGATIVE  NEGATIVE Final  . Ketones, ur 01/20/2017 NEGATIVE  NEGATIVE mg/dL Final  . Protein, ur 01/20/2017 NEGATIVE  NEGATIVE mg/dL Final  . Nitrite 01/20/2017 NEGATIVE  NEGATIVE Final  . Leukocytes, UA 01/20/2017 TRACE* NEGATIVE Final  . RBC / HPF 01/20/2017 0-5  0 - 5 RBC/hpf Final  . WBC, UA 01/20/2017 0-5  0 - 5 WBC/hpf Final  . Bacteria, UA 01/20/2017 RARE* NONE SEEN Final  . Squamous Epithelial / LPF 01/20/2017 NONE SEEN  NONE SEEN Final  . Mucus 01/20/2017 PRESENT   Final  . Ca Oxalate Crys, UA 01/20/2017 PRESENT   Final  . ABO/RH(D) 01/20/2017 O NEG   Final     X-Rays:Dg Chest 2 View  Result Date: 01/20/2017 CLINICAL DATA:  Preoperative left total knee joint replacement. Previous MI. Some cough. No other chest complaints. Former smoker. EXAM: CHEST  2 VIEW COMPARISON:  Chest x-ray of October 22, 2015 FINDINGS: The lungs are adequately inflated. The interstitial markings are coarse though stable. Subtle increased density lateral to the cardiac apex is observed and appears new. There is a trace of blunting of the left lateral costophrenic angle which is new. The heart and pulmonary vascularity are normal. The mediastinum is normal in width. The bony thorax exhibits no acute abnormality. IMPRESSION: Subsegmental atelectasis is suspected in the left lower lung with a trace of pleural fluid. The findings are  new since the previous study. There are underlying chronic bronchitic changes. Followup PA and lateral chest X-ray is recommended in 3-4 weeks following trial of antibiotic therapy to ensure resolution. Electronically Signed   By: David  Martinique M.D.   On: 01/20/2017 09:28    EKG: Orders placed or performed in visit on  05/22/16  . EKG 12-Lead     Hospital Course: Tony Patrick is a 67 y.o. who was admitted to [facility]. They were brought to the operating room on 01/29/2017 and underwent Procedure(s): LEFT TOTAL KNEE ARTHROPLASTY.  Patient tolerated the procedure well and was later transferred to the recovery room and then to the orthopaedic floor for postoperative care.  They were given PO and IV analgesics for pain control following their surgery.  They were given 24 hours of postoperative antibiotics of  Anti-infectives (From admission, onward)   Start     Dose/Rate Route Frequency Ordered Stop   01/29/17 1500  ceFAZolin (ANCEF) IVPB 1 g/50 mL premix     1 g 100 mL/hr over 30 Minutes Intravenous Every 6 hours 01/29/17 1325 01/29/17 2147   01/29/17 0902  polymyxin B 500,000 Units, bacitracin 50,000 Units in sodium chloride 0.9 % 500 mL irrigation  Status:  Discontinued       As needed 01/29/17 0905 01/29/17 1043   01/29/17 0638  ceFAZolin (ANCEF) IVPB 2g/100 mL premix     2 g 200 mL/hr over 30 Minutes Intravenous On call to O.R. 01/29/17 2595 01/29/17 0856     and started on DVT prophylaxis in the form of Xarelto.   PT and OT were ordered for total joint protocol.  Discharge planning consulted to help with postop disposition and equipment needs.  Patient had a good night on the evening of surgery.  They started to get up OOB with therapy on day one. Hemovac drain was pulled without difficulty.  Continued to work with therapy into day two.  Dressing was changed on day two and the incision was clean and dry.  Incision was healing well.  Patient was seen in rounds and was ready to go home.   Diet: Cardiac diet Activity:WBAT Follow-up:in 2 weeks Disposition - Home Discharged Condition: stable   Discharge Instructions    Call MD / Call 911   Complete by:  As directed    If you experience chest pain or shortness of breath, CALL 911 and be transported to the hospital emergency room.  If you  develope a fever above 101 F, pus (white drainage) or increased drainage or redness at the wound, or calf pain, call your surgeon's office.   Constipation Prevention   Complete by:  As directed    Drink plenty of fluids.  Prune juice may be helpful.  You may use a stool softener, such as Colace (over the counter) 100 mg twice a day.  Use MiraLax (over the counter) for constipation as needed.   Diet - low sodium heart healthy   Complete by:  As directed    Discharge instructions   Complete by:  As directed    INSTRUCTIONS AFTER JOINT REPLACEMENT   Remove items at home which could result in a fall. This includes throw rugs or furniture in walking pathways ICE to the affected joint every three hours while awake for 30 minutes at a time, for at least the first 3-5 days, and then as needed for pain and swelling.  Continue to use ice for pain and swelling. You may notice swelling that  will progress down to the foot and ankle.  This is normal after surgery.  Elevate your leg when you are not up walking on it.   Continue to use the breathing machine you got in the hospital (incentive spirometer) which will help keep your temperature down.  It is common for your temperature to cycle up and down following surgery, especially at night when you are not up moving around and exerting yourself.  The breathing machine keeps your lungs expanded and your temperature down.   DIET:  As you were doing prior to hospitalization, we recommend a well-balanced diet.  DRESSING / WOUND CARE / SHOWERING  You may change your dressing every day with sterile gauze.  Please use good hand washing techniques before changing the dressing.  Do not use any lotions or creams on the incision until instructed by your surgeon.  ACTIVITY  Increase activity slowly as tolerated, but follow the weight bearing instructions below.   No driving for 6 weeks or until further direction given by your physician.  You cannot drive while taking  narcotics.  No lifting or carrying greater than 10 lbs. until further directed by your surgeon. Avoid periods of inactivity such as sitting longer than an hour when not asleep. This helps prevent blood clots.  You may return to work once you are authorized by your doctor.     WEIGHT BEARING   Weight bearing as tolerated with assist device (walker, cane, etc) as directed, use it as long as suggested by your surgeon or therapist, typically at least 4-6 weeks.   EXERCISES  Results after joint replacement surgery are often greatly improved when you follow the exercise, range of motion and muscle strengthening exercises prescribed by your doctor. Safety measures are also important to protect the joint from further injury. Any time any of these exercises cause you to have increased pain or swelling, decrease what you are doing until you are comfortable again and then slowly increase them. If you have problems or questions, call your caregiver or physical therapist for advice.   Rehabilitation is important following a joint replacement. After just a few days of immobilization, the muscles of the leg can become weakened and shrink (atrophy).  These exercises are designed to build up the tone and strength of the thigh and leg muscles and to improve motion. Often times heat used for twenty to thirty minutes before working out will loosen up your tissues and help with improving the range of motion but do not use heat for the first two weeks following surgery (sometimes heat can increase post-operative swelling).   These exercises can be done on a training (exercise) mat, on the floor, on a table or on a bed. Use whatever works the best and is most comfortable for you.    Use music or television while you are exercising so that the exercises are a pleasant break in your day. This will make your life better with the exercises acting as a break in your routine that you can look forward to.   Perform all  exercises about fifteen times, three times per day or as directed.  You should exercise both the operative leg and the other leg as well.  Exercises include:   Quad Sets - Tighten up the muscle on the front of the thigh (Quad) and hold for 5-10 seconds.   Straight Leg Raises - With your knee straight (if you were given a brace, keep it on), lift the leg to 60  degrees, hold for 3 seconds, and slowly lower the leg.  Perform this exercise against resistance later as your leg gets stronger.  Leg Slides: Lying on your back, slowly slide your foot toward your buttocks, bending your knee up off the floor (only go as far as is comfortable). Then slowly slide your foot back down until your leg is flat on the floor again.  Angel Wings: Lying on your back spread your legs to the side as far apart as you can without causing discomfort.  Hamstring Strength:  Lying on your back, push your heel against the floor with your leg straight by tightening up the muscles of your buttocks.  Repeat, but this time bend your knee to a comfortable angle, and push your heel against the floor.  You may put a pillow under the heel to make it more comfortable if necessary.   A rehabilitation program following joint replacement surgery can speed recovery and prevent re-injury in the future due to weakened muscles. Contact your doctor or a physical therapist for more information on knee rehabilitation.    CONSTIPATION  Constipation is defined medically as fewer than three stools per week and severe constipation as less than one stool per week.  Even if you have a regular bowel pattern at home, your normal regimen is likely to be disrupted due to multiple reasons following surgery.  Combination of anesthesia, postoperative narcotics, change in appetite and fluid intake all can affect your bowels.   YOU MUST use at least one of the following options; they are listed in order of increasing strength to get the job done.  They are all  available over the counter, and you may need to use some, POSSIBLY even all of these options:    Drink plenty of fluids (prune juice may be helpful) and high fiber foods Colace 100 mg by mouth twice a day  Senokot for constipation as directed and as needed Dulcolax (bisacodyl), take with full glass of water  Miralax (polyethylene glycol) once or twice a day as needed.  If you have tried all these things and are unable to have a bowel movement in the first 3-4 days after surgery call either your surgeon or your primary doctor.    If you experience loose stools or diarrhea, hold the medications until you stool forms back up.  If your symptoms do not get better within 1 week or if they get worse, check with your doctor.  If you experience "the worst abdominal pain ever" or develop nausea or vomiting, please contact the office immediately for further recommendations for treatment.   ITCHING:  If you experience itching with your medications, try taking only a single pain pill, or even half a pain pill at a time.  You can also use Benadryl over the counter for itching or also to help with sleep.   TED HOSE STOCKINGS:  Use stockings on both legs until for at least 2 weeks or as directed by physician office. They may be removed at night for sleeping.  MEDICATIONS:  See your medication summary on the "After Visit Summary" that nursing will review with you.  You may have some home medications which will be placed on hold until you complete the course of blood thinner medication.  It is important for you to complete the blood thinner medication as prescribed.  PRECAUTIONS:  If you experience chest pain or shortness of breath - call 911 immediately for transfer to the hospital emergency department.   If  you develop a fever greater that 101 F, purulent drainage from wound, increased redness or drainage from wound, foul odor from the wound/dressing, or calf pain - CONTACT YOUR SURGEON.                                                    FOLLOW-UP APPOINTMENTS:  If you do not already have a post-op appointment, please call the office for an appointment to be seen by your surgeon.  Guidelines for how soon to be seen are listed in your "After Visit Summary", but are typically between 1-4 weeks after surgery.  MAKE SURE YOU:  Understand these instructions.  Get help right away if you are not doing well or get worse.    Thank you for letting us be a part of your medical care team.  It is a privilege we respect greatly.  We hope these instructions will help you stay on track for a fast and full recovery!   Increase activity slowly as tolerated   Complete by:  As directed      Allergies as of 01/31/2017      Reactions   Ivp Dye [iodinated Diagnostic Agents] Swelling, Other (See Comments)   Arrest   Morphine And Related Nausea And Vomiting      Medication List    STOP taking these medications   ibuprofen 800 MG tablet Commonly known as:  ADVIL,MOTRIN     TAKE these medications   albuterol 108 (90 Base) MCG/ACT inhaler Commonly known as:  PROVENTIL HFA;VENTOLIN HFA Inhale 1-2 puffs into the lungs every 6 (six) hours as needed for wheezing.   aspirin EC 325 MG tablet Take 1 tablet (325 mg total) by mouth 2 (two) times daily. What changed:    medication strength  how much to take  when to take this   carvedilol 25 MG tablet Commonly known as:  COREG TAKE 1 TABLET TWICE A DAY   cetirizine 10 MG tablet Commonly known as:  ZYRTEC TAKE 1 TABLET DAILY   diclofenac sodium 1 % Gel Commonly known as:  VOLTAREN Apply 1 application topically 3 (three) times daily as needed (for hands).   enalapril 20 MG tablet Commonly known as:  VASOTEC TAKE ONE AND ONE-HALF TABLETS TWICE A DAY   finasteride 5 MG tablet Commonly known as:  PROSCAR Take 5 mg by mouth daily.   fluticasone 50 MCG/ACT nasal spray Commonly known as:  FLONASE Place 2 sprays into both nostrils daily. What changed:     when to take this  reasons to take this   HYDROcodone-acetaminophen 10-325 MG tablet Commonly known as:  NORCO Take 1 tablet by mouth every 4 (four) hours as needed for severe pain ((score 7 to 10)).   HYDROcodone-homatropine 5-1.5 MG/5ML syrup Commonly known as:  HYCODAN Take 5-10 mLs by mouth every 6 (six) hours as needed.   ipratropium-albuterol 0.5-2.5 (3) MG/3ML Soln Commonly known as:  DUONEB Take 3 mLs by nebulization every 6 (six) hours as needed (shortness of breath).   methocarbamol 500 MG tablet Commonly known as:  ROBAXIN Take 1 tablet (500 mg total) by mouth every 6 (six) hours as needed for muscle spasms.   omeprazole 20 MG capsule Commonly known as:  PRILOSEC TAKE 1 CAPSULE DAILY   simvastatin 40 MG tablet Commonly known as:  ZOCOR TAKE 1 TABLET  EVERY EVENING   tamsulosin 0.4 MG Caps capsule Commonly known as:  FLOMAX Take 0.4 mg by mouth every evening.      Follow-up Information    Latanya Maudlin, MD. Schedule an appointment as soon as possible for a visit in 2 week(s).   Specialty:  Orthopedic Surgery Contact information: [address] [place] [address] [phone]           Signed: Ardeen Jourdain, PA-C Orthopaedic Surgery 01/31/2017, 4:54 PM

## 2017-01-31 NOTE — Progress Notes (Signed)
Physical Therapy Treatment Patient Details Name: Tony Patrick MRN: [id number] DOB: November 10, 1950 Today's Date: 01/31/2017    History of Present Illness s/p L TKA    PT Comments    Pt motivated and eager for dc home.  Reviewed stairs and don/doff KI.   Follow Up Recommendations  DC plan and follow up therapy as arranged by surgeon     Equipment Recommendations  None recommended by PT    Recommendations for Other Services       Precautions / Restrictions Precautions Precautions: Fall;Knee Required Braces or Orthoses: Knee Immobilizer - Left Knee Immobilizer - Left: Discontinue once straight leg raise with < 10 degree lag Restrictions Weight Bearing Restrictions: No Other Position/Activity Restrictions: wbat    Mobility  Bed Mobility Overal bed mobility: Needs Assistance Bed Mobility: Supine to Sit     Supine to sit: Min guard     General bed mobility comments: Increased time with cues for sequence and use of R LE to self assist  Transfers Overall transfer level: Needs assistance Equipment used: Rolling walker (2 wheeled) Transfers: Sit to/from Stand Sit to Stand: Min guard;Supervision         General transfer comment: min cues for LE management and use of UEs to self assist  Ambulation/Gait Ambulation/Gait assistance: Min guard;Supervision Ambulation Distance (Feet): 123 Feet Assistive device: Rolling walker (2 wheeled) Gait Pattern/deviations: Step-to pattern;Decreased step length - right;Decreased step length - left;Shuffle;Trunk flexed Gait velocity: decr Gait velocity interpretation: Below normal speed for age/gender General Gait Details: cues for sequence, posture and position from RW   Stairs Stairs: Yes   Stair Management: No rails;Step to pattern;Backwards;With walker Number of Stairs: 2 General stair comments: single step twice bkwd with RW and cues for sequence and foot/RW placement  Wheelchair Mobility    Modified Rankin (Stroke  Patients Only)       Balance                                            Cognition Arousal/Alertness: Awake/alert Behavior During Therapy: WFL for tasks assessed/performed Overall Cognitive Status: Within Functional Limits for tasks assessed                                        Exercises Total Joint Exercises Ankle Circles/Pumps: AROM;Both;20 reps;Supine Quad Sets: AROM;Both;10 reps;Supine Short Arc Quad: AAROM;AROM;Left;Supine;10 reps Heel Slides: AAROM;Left;Supine;20 reps Straight Leg Raises: AAROM;Left;Supine;20 reps Knee Flexion: AAROM;Left;15 reps;Seated Goniometric ROM: AAROM at L knee -10 - 40    General Comments        Pertinent Vitals/Pain Pain Assessment: 0-10 Pain Score: 4  Pain Location: l knee Pain Descriptors / Indicators: Sore;Aching Pain Intervention(s): Limited activity within patient's tolerance;Monitored during session;Premedicated before session;Ice applied    Home Living                      Prior Function            PT Goals (current goals can now be found in the care plan section) Acute Rehab PT Goals Patient Stated Goal: Regain IND  PT Goal Formulation: With patient Time For Goal Achievement: 02/01/17 Potential to Achieve Goals: Good Progress towards PT goals: Progressing toward goals    Frequency    7X/week  PT Plan Current plan remains appropriate    Co-evaluation              AM-PAC PT "6 Clicks" Daily Activity  Outcome Measure  Difficulty turning over in bed (including adjusting bedclothes, sheets and blankets)?: A Lot Difficulty moving from lying on back to sitting on the side of the bed? : A Lot Difficulty sitting down on and standing up from a chair with arms (e.g., wheelchair, bedside commode, etc,.)?: A Lot Help needed moving to and from a bed to chair (including a wheelchair)?: A Little Help needed walking in hospital room?: A Little Help needed climbing 3-5  steps with a railing? : A Little 6 Click Score: 15    End of Session Equipment Utilized During Treatment: Gait belt;Left knee immobilizer Activity Tolerance: Patient tolerated treatment well Patient left: in chair;with call bell/phone within reach;with family/visitor present Nurse Communication: Mobility status PT Visit Diagnosis: Difficulty in walking, not elsewhere classified (R26.2)     Time: 0935-1001 PT Time Calculation (min) (ACUTE ONLY): 26 min  Charges:  $Gait Training: 8-22 mins $Therapeutic Exercise: 23-37 mins $Therapeutic Activity: 8-22 mins                    G Codes:       Pg [phone]    Jasara Corrigan 01/31/2017, 12:29 PM

## 2017-01-31 NOTE — Progress Notes (Signed)
Subjective: 2 Days Post-Op Procedure(s) (LRB): LEFT TOTAL KNEE ARTHROPLASTY (Left) Patient reports pain as 2 on 0-10 scale. He is doing very well today. Will DC   Objective: Vital signs in last 24 hours: Temp:  [97.5 F (36.4 C)-99.2 F (37.3 C)] 99.2 F (37.3 C) (01/18 0516) Pulse Rate:  [74-81] 78 (01/18 0516) Resp:  [15-16] 15 (01/18 0516) BP: (117-154)/(62-85) 124/62 (01/18 0516) SpO2:  [93 %-95 %] 94 % (01/18 0516)  Intake/Output from previous day: 01/17 0701 - 01/18 0700 In: 1940 [P.O.:1140; I.V.:800] Out: 1550 [Urine:1550] Intake/Output this shift: No intake/output data recorded.  Recent Labs    01/30/17 0550 01/31/17 0537  HGB 12.7* 12.9*   Recent Labs    01/30/17 0550 01/31/17 0537  WBC 10.3 11.5*  RBC 4.23 4.31  HCT 39.2 39.4  PLT 142* 162   Recent Labs    01/30/17 0550 01/31/17 0537  NA 138 138  K 4.0 4.0  CL 107 108  CO2 26 26  BUN 17 16  CREATININE 0.94 0.87  GLUCOSE 118* 123*  CALCIUM 8.4* 8.7*   No results for input(s): LABPT, INR in the last 72 hours.  Dorsiflexion/Plantar flexion intact  Assessment/Plan: 2 Days Post-Op Procedure(s) (LRB): LEFT TOTAL KNEE ARTHROPLASTY (Left) Up with therapy.DC today  Latanya Maudlin 01/31/2017, 7:28 AM

## 2017-02-05 ENCOUNTER — Ambulatory Visit: Payer: Worker's Compensation | Attending: Orthopedic Surgery | Admitting: Physical Therapy

## 2017-02-05 ENCOUNTER — Other Ambulatory Visit: Payer: Self-pay

## 2017-02-05 DIAGNOSIS — R262 Difficulty in walking, not elsewhere classified: Secondary | ICD-10-CM | POA: Diagnosis present

## 2017-02-05 DIAGNOSIS — M25662 Stiffness of left knee, not elsewhere classified: Secondary | ICD-10-CM | POA: Insufficient documentation

## 2017-02-05 DIAGNOSIS — M25562 Pain in left knee: Secondary | ICD-10-CM | POA: Insufficient documentation

## 2017-02-05 NOTE — Therapy (Signed)
[facility] [place], [place], [address] Phone: [phone]   Fax:  [phone]  Physical Therapy Evaluation  Patient Details  Name: Aadi Bordner MRN: [id number] Date of Birth: 1950-02-17 Referring Provider: Dr. Gladstone Lighter   Encounter Date: 02/05/2017  PT End of Session - 02/05/17 1427    Visit Number  1    Number of Visits  12    Date for PT Re-Evaluation  03/05/17    Authorization Type  Worker's Compensation authorized 12 visits    Authorization - Visit Number  1    Authorization - Number of Visits  12    PT Start Time  3382    PT Stop Time  1406    PT Time Calculation (min)  59 min    Activity Tolerance  Patient tolerated treatment well    Behavior During Therapy  [facility] for tasks assessed/performed       Past Medical History:  Diagnosis Date  . Arthritis   . Atherosclerotic plaque   . Basal cell carcinoma   . CAD (coronary artery disease)    Anterior MI, [facility], 1995, no PCI, total LAD  / catheterization 2001, 30% LAD beyond the origin of a large diagonal, circumflex normal, RCA normal, anterolateral and apical  akinesis, ejection fraction 25-35% range  . Dyslipidemia   . Ejection fraction < 50%    EF 25-35%, catheterization 2001 / EF 20-25%, global hypokinesis, echo in June, 2012  . GERD (gastroesophageal reflux disease)   . History of bronchitis   . Hypoxia    Pneumonia, hospitalization, June, 2012  . Kidney stones 1990's  . MI (myocardial infarction) ([place]) 1995  . Pneumonia 2012  . S/P colectomy    Benign tumor    Past Surgical History:  Procedure Laterality Date  . CHEST TUBE INSERTION    . COLECTOMY  1981   benign mass  . COLONOSCOPY WITH PROPOFOL N/A 07/04/2014   Procedure: COLONOSCOPY WITH PROPOFOL;  Surgeon: Gatha Mayer, MD;  Location: [facility];  Service: Endoscopy;  Laterality: N/A;  . KNEE ARTHROSCOPY Left 07/19/2015   Procedure: LEFT ARTHROSCOPY KNEE WITH MEDIAL MENISCECTOMY;  Surgeon:  Latanya Maudlin, MD;  Location: [facility];  Service: Orthopedics;  Laterality: Left;  LMA  . LEG SURGERY Left    cyst  . PLEURAL SCARIFICATION  1978  . SHOULDER OPEN ROTATOR CUFF REPAIR Left 05/26/2015   Procedure: LEFT OPEN ACROMINECTOMY AND REPAIR WITH GRAFT AND ANCHOR ROTATOR CUFF TEAR ;  Surgeon: Latanya Maudlin, MD;  Location: [facility];  Service: Orthopedics;  Laterality: Left;  Left shoulder block in holding  . TOTAL KNEE ARTHROPLASTY Left 01/29/2017   Procedure: LEFT TOTAL KNEE ARTHROPLASTY;  Surgeon: Latanya Maudlin, MD;  Location: [facility];  Service: Orthopedics;  Laterality: Left;  Adductor Block    There were no vitals filed for this visit.   Subjective Assessment - 02/05/17 1310    Subjective  Patient presents to physical therapy s/p left total knee replacement by Dr. Gladstone Lighter (02/01/17). Patient is accompanied by son. Patient fell while on a call at work on 03/27/2015. Patient had a left arthroscopy with medial meniscectomy on 07/19/15. Patient ambulates with a rolling walker and knee immobilizer on left knee. He states he only uses the knee immobilizer if he needs to walk outdoors for more stability. Patient reports pain at rest is 3/10. Pain is relieved by ice and rest. Pain is aggravated by "doing too much". Patient reports performing HEP from home health physical therapy  daily and applies ice and elevates after exercise. Patient is currently not driving. Patient's main goal of PT is "to get back to moving."    Patient is accompained by:  Family member Son    Pertinent History  02/01/17 L TKA    Limitations  Walking;Standing    How long can you stand comfortably?  With walker 30 mins    How long can you walk comfortably?  Less than 10 minutes    Diagnostic tests  MRI, X-Ray    Patient Stated Goals  Gain motion.    Currently in Pain?  Yes    Pain Score  3     Pain Location  Knee    Pain Orientation  Left    Pain Type  Surgical pain    Pain Onset  1 to 4 weeks ago    Pain Frequency  Constant     Aggravating Factors   too much motion    Pain Relieving Factors  Rest with ice and elevation, pain medications PRN         [facility] PT Assessment - 02/05/17 1315      Assessment   Medical Diagnosis  Left total knee replacement    Referring Provider  Dr. Gladstone Lighter    Onset Date/Surgical Date  01/29/17    Next MD Visit  02/11/2017      Restrictions   Weight Bearing Restrictions  -- WBAT      Balance Screen   Has the patient fallen in the past 6 months  No    Has the patient had a decrease in activity level because of a fear of falling?   No    Is the patient reluctant to leave their home because of a fear of falling?   No      Home Environment   Living Environment  Private residence    Living Arrangements  Spouse/significant other;Children    [place] to enter 4" step, banister    Entrance Stairs-Number of Steps  4      Prior Function   Level of Independence  Independent with household mobility with device    Vocation  Retired      Observation/Other Assessments   Skin Integrity  Incision covered with gauze and medical tape; increased ecchymosis in popliteal fossa    Focus on Therapeutic Outcomes (FOTO)   75% limitation      Observation/Other Assessments-Edema    Edema  Circumferential Left 47 cm, Right 42.5 cm; 4.5 cm difference       Sensation   Light Touch  Appears Intact      ROM / Strength   AROM / PROM / Strength  PROM;AROM;Strength      AROM   Overall AROM   Deficits    Overall AROM Comments  Left knee extension -10 degrees, left knee flexion 50 degrees      PROM   Overall PROM   Deficits    Overall PROM Comments  Left knee extension -8 degrees; Left knee flexion 55 degrees      Strength   Overall Strength Comments  L knee strength grossly assessed during evaluation 3/5; Left hip abduction 3+/5      Transfers   Transfers  --      Ambulation/Gait   Ambulation/Gait  Yes    Assistive device  Rolling walker    Gait Pattern  Step-to pattern;Wide  base of support;Decreased stride length;Antalgic    Ambulation Surface  Level  Objective measurements completed on examination: See above findings.      [facility] Adult PT Treatment/Exercise - 02/05/17 1315      Transfers   Comments  Sit to stand transfers with supervision and use of arm rests to ascend and descend.      Exercises   Exercises  Knee/Hip      Knee/Hip Exercises: Aerobic   Stationary Bike  Half moon AAROM, x10 minutes to improve knee flexion      Modalities   Modalities  Vasopneumatic      Vasopneumatic   Number Minutes Vasopneumatic   15 minutes    Vasopnuematic Location   Knee    Vasopneumatic Pressure  Medium    Vasopneumatic Temperature   34             PT Education - 02/05/17 1452    Education provided  No Patient performs HEP provided by home health physical therapy, see scan under referral       PT Short Term Goals - 02/05/17 1507      PT SHORT TERM GOAL #1   Title  Patient will improve left knee flexion AROM to 85 degrees to improve functional mobility.    Time  2    Period  Weeks    Status  New    Target Date  02/19/17      PT SHORT TERM GOAL #2   Title  Patient will improve left knee extension AROM to -5 degrees to improve functional mobility.    Time  2    Period  Weeks    Status  New      PT SHORT TERM GOAL #3   Title  Patient will decrease pain at rest to less than or equal to 2/10.    Time  2    Period  Weeks    Status  New    Target Date  02/19/17        PT Long Term Goals - 02/05/17 1511      PT LONG TERM GOAL #1   Title  Patient will improve Left Flexion knee AROM  to 110 degrees in order to perform functional tasks and ADLs.    Time  4    Period  Weeks    Status  New    Target Date  03/05/17      PT LONG TERM GOAL #2   Title  Patient will improve left knee extension AROM to 0 degrees to normalize gait pattern and perform functional tasks and ADLs.    Time  4    Period  Weeks    Status  New     Target Date  03/05/17      PT LONG TERM GOAL #3   Title  Patient will improve left knee strength to 5/5 for adequate knee stabilization to perform functional activities and ADLs.    Time  4    Period  Weeks    Status  New    Target Date  03/05/17      PT LONG TERM GOAL #4   Title  Patient will ascend and descend steps with proper step over step gait pattern with UE support in order to safely enter and exit home..    Time  4    Period  Weeks    Status  New    Target Date  03/05/17      PT LONG TERM GOAL #5   Title  Patient will ambulate with single point  cane with </= 2/10 pain, normalized, step through gait pattern in order to ambulate efficiently within community.    Time  4    Period  Weeks    Status  New    Target Date  03/05/17             Plan - 02/05/17 1453    Clinical Impression Statement  Patient is a motivated 67 year old male who presents to physical therapy s/p left total knee replacement (02/01/17). Patient has limited strength and active and passive ROM, see objective assessment. Patient would benefit from skilled physical therapy to increase strength, improve active and passive range of motion, and normalize gait without an assisted device. Patient currently as 12 approved visits.    Clinical Presentation  Stable    Clinical Decision Making  Low    Rehab Potential  Excellent    PT Frequency  3x / week    PT Duration  4 weeks    PT Treatment/Interventions  ADLs/Self Care Home Management;Cryotherapy;Electrical Stimulation;Moist Heat;Iontophoresis 4mg /ml Dexamethasone;Therapeutic exercise;Therapeutic activities;Patient/family education;Neuromuscular re-education;Balance training;Manual techniques;Passive range of motion;Vasopneumatic Device    PT Next Visit Plan  Begin range of motion exercises half moons on bike, isometric strengthening, modalities for pain relief    Consulted and Agree with Plan of Care  Patient;Family member/caregiver    Family Member Consulted   Son       Patient will benefit from skilled therapeutic intervention in order to improve the following deficits and impairments:  Abnormal gait, Pain, Decreased mobility, Decreased range of motion, Decreased strength, Difficulty walking, Increased edema  Visit Diagnosis: Acute pain of left knee  Stiffness of left knee, not elsewhere classified  Difficulty in walking, not elsewhere classified     Problem List Patient Active Problem List   Diagnosis Date Noted  . Hx of total knee arthroplasty, right 01/29/2017  . Achilles tendinitis of right lower extremity 11/21/2016  . Dilated cardiomyopathy ([place]) 05/22/2016  . History of MI (myocardial infarction) 05/22/2016  . Benign prostatic hyperplasia with urinary frequency 05/16/2016  . Tear of rotator cuff 05/26/2015  . Shoulder injury 04/06/2015  . Rotator cuff syndrome 04/06/2015  . Special screening for malignant neoplasms, colon   . CAD (coronary artery disease)   . GERD (gastroesophageal reflux disease)   . Dyslipidemia   . Ejection fraction < 50%   . Hypoxia    Gabriela Eves, PT, DPT 02/05/2017, 3:27 PM  [facility] [address] [place], [place], [address] Phone: [phone]   Fax:  [phone]  Name: Philopateer Strine MRN: [id number] Date of Birth: [birth year]

## 2017-02-07 ENCOUNTER — Ambulatory Visit: Payer: Worker's Compensation | Admitting: *Deleted

## 2017-02-07 DIAGNOSIS — M25562 Pain in left knee: Secondary | ICD-10-CM

## 2017-02-07 DIAGNOSIS — R262 Difficulty in walking, not elsewhere classified: Secondary | ICD-10-CM

## 2017-02-07 DIAGNOSIS — M25662 Stiffness of left knee, not elsewhere classified: Secondary | ICD-10-CM

## 2017-02-07 NOTE — Therapy (Signed)
[facility] [place], [place], [address] Phone: [phone]   Fax:  [phone]  Physical Therapy Treatment  Patient Details  Name: Trung Wenzl MRN: [id number] Date of Birth: 1950-09-22 Referring Provider: Dr. Gladstone Lighter   Encounter Date: 02/07/2017  PT End of Session - 02/07/17 1042    Visit Number  2    Number of Visits  12    Date for PT Re-Evaluation  03/05/17    Authorization Type  Worker's Compensation authorized 12 visits    Authorization - Visit Number  2    Authorization - Number of Visits  12    PT Start Time  1030    PT Stop Time  1130    PT Time Calculation (min)  60 min       Past Medical History:  Diagnosis Date  . Arthritis   . Atherosclerotic plaque   . Basal cell carcinoma   . CAD (coronary artery disease)    Anterior MI, [facility], 1995, no PCI, total LAD  / catheterization 2001, 30% LAD beyond the origin of a large diagonal, circumflex normal, RCA normal, anterolateral and apical  akinesis, ejection fraction 25-35% range  . Dyslipidemia   . Ejection fraction < 50%    EF 25-35%, catheterization 2001 / EF 20-25%, global hypokinesis, echo in June, 2012  . GERD (gastroesophageal reflux disease)   . History of bronchitis   . Hypoxia    Pneumonia, hospitalization, June, 2012  . Kidney stones 1990's  . MI (myocardial infarction) ([place]) 1995  . Pneumonia 2012  . S/P colectomy    Benign tumor    Past Surgical History:  Procedure Laterality Date  . CHEST TUBE INSERTION    . COLECTOMY  1981   benign mass  . COLONOSCOPY WITH PROPOFOL N/A 07/04/2014   Procedure: COLONOSCOPY WITH PROPOFOL;  Surgeon: Gatha Mayer, MD;  Location: [facility];  Service: Endoscopy;  Laterality: N/A;  . KNEE ARTHROSCOPY Left 07/19/2015   Procedure: LEFT ARTHROSCOPY KNEE WITH MEDIAL MENISCECTOMY;  Surgeon: Latanya Maudlin, MD;  Location: [facility];  Service: Orthopedics;  Laterality: Left;  LMA  . LEG SURGERY Left    cyst  .  PLEURAL SCARIFICATION  1978  . SHOULDER OPEN ROTATOR CUFF REPAIR Left 05/26/2015   Procedure: LEFT OPEN ACROMINECTOMY AND REPAIR WITH GRAFT AND ANCHOR ROTATOR CUFF TEAR ;  Surgeon: Latanya Maudlin, MD;  Location: [facility];  Service: Orthopedics;  Laterality: Left;  Left shoulder block in holding  . TOTAL KNEE ARTHROPLASTY Left 01/29/2017   Procedure: LEFT TOTAL KNEE ARTHROPLASTY;  Surgeon: Latanya Maudlin, MD;  Location: [facility];  Service: Orthopedics;  Laterality: Left;  Adductor Block    There were no vitals filed for this visit.  Subjective Assessment - 02/07/17 1031    Subjective  Patient presents to physical therapy s/p left total knee replacement by Dr. Gladstone Lighter (02/01/17). Patient is accompanied by son. Patient fell while on a call at work on 03/27/2015. Patient had a left arthroscopy with medial meniscectomy on 07/19/15. Patient ambulates with a rolling walker and knee immobilizer on left knee. He states he only uses the knee immobilizer if he needs to walk outdoors for more stability. Patient reports pain at rest is 3/10. Pain is relieved by ice and rest. Pain is aggravated by "doing too much". Patient reports performing HEP from home health physical therapy daily and applies ice and elevates after exercise. Patient is currently not driving. Patient's main goal of PT is "to get back  to moving."    Patient is accompained by:  Family member    Pertinent History  01/29/17 L TKA    Limitations  Walking;Standing    How long can you stand comfortably?  With walker 30 mins    How long can you walk comfortably?  Less than 10 minutes    Diagnostic tests  MRI, X-Ray    Patient Stated Goals  Gain motion.    Pain Score  3                       [facility] Adult PT Treatment/Exercise - 02/07/17 0001      Exercises   Exercises  Knee/Hip      Knee/Hip Exercises: Aerobic   Stationary Bike  Half moon AAROM, x15 minutes to improve knee flexion    Nustep  L3 x 6 min seat 12      Modalities    Modalities  Vasopneumatic      Vasopneumatic   Number Minutes Vasopneumatic   15 minutes    Vasopnuematic Location   Knee    Vasopneumatic Pressure  Medium    Vasopneumatic Temperature   34      Manual Therapy   Manual Therapy  Passive ROM    Passive ROM  PROM for flexion and extension in sitting RT knee               PT Short Term Goals - 02/05/17 1507      PT SHORT TERM GOAL #1   Title  Patient will improve left knee flexion AROM to 85 degrees to improve functional mobility.    Time  2    Period  Weeks    Status  New    Target Date  02/19/17      PT SHORT TERM GOAL #2   Title  Patient will improve left knee extension AROM to -5 degrees to improve functional mobility.    Time  2    Period  Weeks    Status  New      PT SHORT TERM GOAL #3   Title  Patient will decrease pain at rest to less than or equal to 2/10.    Time  2    Period  Weeks    Status  New    Target Date  02/19/17        PT Long Term Goals - 02/05/17 1511      PT LONG TERM GOAL #1   Title  Patient will improve Left Flexion knee AROM  to 110 degrees in order to perform functional tasks and ADLs.    Time  4    Period  Weeks    Status  New    Target Date  03/05/17      PT LONG TERM GOAL #2   Title  Patient will improve left knee extension AROM to 0 degrees to normalize gait pattern and perform functional tasks and ADLs.    Time  4    Period  Weeks    Status  New    Target Date  03/05/17      PT LONG TERM GOAL #3   Title  Patient will improve left knee strength to 5/5 for adequate knee stabilization to perform functional activities and ADLs.    Time  4    Period  Weeks    Status  New    Target Date  03/05/17      PT LONG TERM GOAL #  4   Title  Patient will ascend and descend steps with proper step over step gait pattern with UE support in order to safely enter and exit home..    Time  4    Period  Weeks    Status  New    Target Date  03/05/17      PT LONG TERM GOAL #5   Title   Patient will ambulate with single point cane with </= 2/10 pain, normalized, step through gait pattern in order to ambulate efficiently within community.    Time  4    Period  Weeks    Status  New    Target Date  03/05/17            Plan - 02/07/17 1234    Clinical Impression Statement  Pt arrived today doing fair with LT TKA. He feels that it is still very stiff and swollen. Rx focused on ROM for flexion and extension. Flesion PROM was performed in sitting. Normal modality response to Vaso.    Clinical Presentation  Stable    Clinical Decision Making  Low    Rehab Potential  Excellent    PT Frequency  3x / week    PT Duration  4 weeks    PT Treatment/Interventions  ADLs/Self Care Home Management;Cryotherapy;Electrical Stimulation;Moist Heat;Iontophoresis 4mg /ml Dexamethasone;Therapeutic exercise;Therapeutic activities;Patient/family education;Neuromuscular re-education;Balance training;Manual techniques;Passive range of motion;Vasopneumatic Device    PT Next Visit Plan  Begin range of motion exercises half moons on bike, isometric strengthening, modalities for pain relief    Consulted and Agree with Plan of Care  Patient;Family member/caregiver    Family Member Consulted  Son       Patient will benefit from skilled therapeutic intervention in order to improve the following deficits and impairments:  Abnormal gait, Pain, Decreased mobility, Decreased range of motion, Decreased strength, Difficulty walking, Increased edema  Visit Diagnosis: Acute pain of left knee  Stiffness of left knee, not elsewhere classified  Difficulty in walking, not elsewhere classified     Problem List Patient Active Problem List   Diagnosis Date Noted  . Hx of total knee arthroplasty, right 01/29/2017  . Achilles tendinitis of right lower extremity 11/21/2016  . Dilated cardiomyopathy ([place]) 05/22/2016  . History of MI (myocardial infarction) 05/22/2016  . Benign prostatic hyperplasia with  urinary frequency 05/16/2016  . Tear of rotator cuff 05/26/2015  . Shoulder injury 04/06/2015  . Rotator cuff syndrome 04/06/2015  . Special screening for malignant neoplasms, colon   . CAD (coronary artery disease)   . GERD (gastroesophageal reflux disease)   . Dyslipidemia   . Ejection fraction < 50%   . Hypoxia     Desmen Schoffstall,CHRIS, {PTA 02/07/2017, 12:40 PM  [facility] [place], [place], [address] Phone: [phone]   Fax:  [phone]  Name: Adrean Heitz MRN: [id number] Date of Birth: 10-26-1950

## 2017-02-10 ENCOUNTER — Ambulatory Visit: Payer: Worker's Compensation | Admitting: Physical Therapy

## 2017-02-10 ENCOUNTER — Encounter: Payer: Self-pay | Admitting: Physical Therapy

## 2017-02-10 DIAGNOSIS — M25662 Stiffness of left knee, not elsewhere classified: Secondary | ICD-10-CM

## 2017-02-10 DIAGNOSIS — R262 Difficulty in walking, not elsewhere classified: Secondary | ICD-10-CM

## 2017-02-10 DIAGNOSIS — M25562 Pain in left knee: Secondary | ICD-10-CM

## 2017-02-10 NOTE — Therapy (Signed)
[facility] [place], [place], [address] Phone: [phone]   Fax:  [phone]  Physical Therapy Treatment  Patient Details  Name: Tony Patrick MRN: [id number] Date of Birth: Dec 28, 1950 Referring Provider: Dr. Gladstone Lighter   Encounter Date: 02/10/2017  PT End of Session - 02/10/17 1020    Visit Number  3    Number of Visits  12    Date for PT Re-Evaluation  03/05/17    Authorization Type  Worker's Compensation authorized 12 visits    Authorization - Number of Visits  12    PT Start Time  0945    PT Stop Time  1031    PT Time Calculation (min)  46 min    Activity Tolerance  Patient tolerated treatment well    Behavior During Therapy  [facility] for tasks assessed/performed       Past Medical History:  Diagnosis Date  . Arthritis   . Atherosclerotic plaque   . Basal cell carcinoma   . CAD (coronary artery disease)    Anterior MI, [facility], 1995, no PCI, total LAD  / catheterization 2001, 30% LAD beyond the origin of a large diagonal, circumflex normal, RCA normal, anterolateral and apical  akinesis, ejection fraction 25-35% range  . Dyslipidemia   . Ejection fraction < 50%    EF 25-35%, catheterization 2001 / EF 20-25%, global hypokinesis, echo in June, 2012  . GERD (gastroesophageal reflux disease)   . History of bronchitis   . Hypoxia    Pneumonia, hospitalization, June, 2012  . Kidney stones 1990's  . MI (myocardial infarction) ([place]) 1995  . Pneumonia 2012  . S/P colectomy    Benign tumor    Past Surgical History:  Procedure Laterality Date  . CHEST TUBE INSERTION    . COLECTOMY  1981   benign mass  . COLONOSCOPY WITH PROPOFOL N/A 07/04/2014   Procedure: COLONOSCOPY WITH PROPOFOL;  Surgeon: Gatha Mayer, MD;  Location: [facility];  Service: Endoscopy;  Laterality: N/A;  . KNEE ARTHROSCOPY Left 07/19/2015   Procedure: LEFT ARTHROSCOPY KNEE WITH MEDIAL MENISCECTOMY;  Surgeon: Latanya Maudlin, MD;  Location: [facility];   Service: Orthopedics;  Laterality: Left;  LMA  . LEG SURGERY Left    cyst  . PLEURAL SCARIFICATION  1978  . SHOULDER OPEN ROTATOR CUFF REPAIR Left 05/26/2015   Procedure: LEFT OPEN ACROMINECTOMY AND REPAIR WITH GRAFT AND ANCHOR ROTATOR CUFF TEAR ;  Surgeon: Latanya Maudlin, MD;  Location: [facility];  Service: Orthopedics;  Laterality: Left;  Left shoulder block in holding  . TOTAL KNEE ARTHROPLASTY Left 01/29/2017   Procedure: LEFT TOTAL KNEE ARTHROPLASTY;  Surgeon: Latanya Maudlin, MD;  Location: [facility];  Service: Orthopedics;  Laterality: Left;  Adductor Block    There were no vitals filed for this visit.  Subjective Assessment - 02/10/17 0950    Subjective  Patient reported some ongoing stiffness in knee yet able to bend more at home    Patient is accompained by:  Family member    Pertinent History  01/29/17 L TKA    Limitations  Walking;Standing    How long can you stand comfortably?  With walker 30 mins    How long can you walk comfortably?  Less than 10 minutes    Diagnostic tests  MRI, X-Ray    Patient Stated Goals  Gain motion.    Currently in Pain?  Yes    Pain Score  3     Pain Location  Knee  Pain Orientation  Left    Pain Descriptors / Indicators  Discomfort;Tightness;Aching    Pain Type  Surgical pain    Pain Onset  1 to 4 weeks ago    Pain Frequency  Constant    Aggravating Factors   ROM in knee    Pain Relieving Factors  rest and ice         [facility] PT Assessment - 02/10/17 0001      ROM / Strength   AROM / PROM / Strength  AROM;PROM      AROM   AROM Assessment Site  Knee    Right/Left Knee  Left    Left Knee Extension  -10    Left Knee Flexion  80      PROM   PROM Assessment Site  Knee    Right/Left Knee  Left    Left Knee Extension  -7    Left Knee Flexion  84                  [facility] Adult PT Treatment/Exercise - 02/10/17 0001      Knee/Hip Exercises: Aerobic   Stationary Bike  Half moon AAROM, x15 minutes to improve knee flexion       Vasopneumatic   Number Minutes Vasopneumatic   15 minutes    Vasopnuematic Location   Knee    Vasopneumatic Pressure  Medium      Manual Therapy   Manual Therapy  Passive ROM    Passive ROM  manual PROM for left knee flexion with low load holds then ext with gentle overpressure               PT Short Term Goals - 02/10/17 1021      PT SHORT TERM GOAL #1   Title  Patient will improve left knee flexion AROM to 85 degrees to improve functional mobility.    Time  2    Period  Weeks    Status  On-going AROM 80 degrees 02/10/17      PT SHORT TERM GOAL #2   Title  Patient will improve left knee extension AROM to -5 degrees to improve functional mobility.    Time  2    Period  Weeks    Status  New AROM -10 degrees 02/10/17      PT SHORT TERM GOAL #3   Title  Patient will decrease pain at rest to less than or equal to 2/10.    Time  2    Period  Weeks    Status  On-going        PT Long Term Goals - 02/10/17 1022      PT LONG TERM GOAL #1   Title  Patient will improve Left Flexion knee AROM  to 110 degrees in order to perform functional tasks and ADLs.    Time  4    Period  Weeks    Status  On-going      PT LONG TERM GOAL #2   Title  Patient will improve left knee extension AROM to 0 degrees to normalize gait pattern and perform functional tasks and ADLs.    Time  4    Period  Weeks    Status  On-going      PT LONG TERM GOAL #3   Title  Patient will improve left knee strength to 5/5 for adequate knee stabilization to perform functional activities and ADLs.    Time  4    Period  Weeks    Status  On-going      PT LONG TERM GOAL #4   Title  Patient will ascend and descend steps with proper step over step gait pattern with UE support in order to safely enter and exit home..    Time  4    Period  Weeks    Status  On-going      PT LONG TERM GOAL #5   Title  Patient will ambulate with single point cane with </= 2/10 pain, normalized, step through gait pattern in  order to ambulate efficiently within community.    Time  4    Period  Weeks    Status  On-going            Plan - 02/10/17 1023    Clinical Impression Statement  Patient tolerated treatment well today. Patient is very self educated on exercises and progress upon arrival, today reviewed HEP given by hospital and discussed progression per TKA protocol. Patient improved active and passive ROM today. Patient has ongoing edema in knee and tightness in hamstring muscle and IT band. Patient has F/U appt tomorrow with MD. Goals ongoing at this time. Today focused on ROM only and will progress next visit PT following his MD appt.     Rehab Potential  Excellent    PT Frequency  3x / week    PT Duration  4 weeks    PT Treatment/Interventions  ADLs/Self Care Home Management;Cryotherapy;Electrical Stimulation;Moist Heat;Iontophoresis 4mg /ml Dexamethasone;Therapeutic exercise;Therapeutic activities;Patient/family education;Neuromuscular re-education;Balance training;Manual techniques;Passive range of motion;Vasopneumatic Device    PT Next Visit Plan  cont with TKA and progress patient after visit with MD. Gladstone Lighter after appt tomorrow,MD note sent today    Consulted and Agree with Plan of Care  Patient       Patient will benefit from skilled therapeutic intervention in order to improve the following deficits and impairments:  Abnormal gait, Pain, Decreased mobility, Decreased range of motion, Decreased strength, Difficulty walking, Increased edema  Visit Diagnosis: Acute pain of left knee  Stiffness of left knee, not elsewhere classified  Difficulty in walking, not elsewhere classified     Problem List Patient Active Problem List   Diagnosis Date Noted  . Hx of total knee arthroplasty, right 01/29/2017  . Achilles tendinitis of right lower extremity 11/21/2016  . Dilated cardiomyopathy ([place]) 05/22/2016  . History of MI (myocardial infarction) 05/22/2016  . Benign prostatic hyperplasia with  urinary frequency 05/16/2016  . Tear of rotator cuff 05/26/2015  . Shoulder injury 04/06/2015  . Rotator cuff syndrome 04/06/2015  . Special screening for malignant neoplasms, colon   . CAD (coronary artery disease)   . GERD (gastroesophageal reflux disease)   . Dyslipidemia   . Ejection fraction < 50%   . Hypoxia    Ladean Raya, PTA 02/10/17 10:34 AM  [facility] [place], [place], [address] Phone: [phone]   Fax:  [phone]  Name: Tony Patrick MRN: [id number] Date of Birth: 1950/02/24

## 2017-02-12 ENCOUNTER — Ambulatory Visit: Payer: Worker's Compensation | Admitting: Physical Therapy

## 2017-02-12 DIAGNOSIS — M25562 Pain in left knee: Secondary | ICD-10-CM

## 2017-02-12 DIAGNOSIS — R262 Difficulty in walking, not elsewhere classified: Secondary | ICD-10-CM

## 2017-02-12 DIAGNOSIS — M25662 Stiffness of left knee, not elsewhere classified: Secondary | ICD-10-CM

## 2017-02-12 NOTE — Therapy (Signed)
[facility] [place], [place], [address] Phone: [phone]   Fax:  [phone]  Physical Therapy Treatment  Patient Details  Name: Tony Patrick MRN: [id number] Date of Birth: June 21, 1950 Referring Provider: Dr. Gladstone Lighter   Encounter Date: 02/12/2017  PT End of Session - 02/12/17 0956    Visit Number  4    Number of Visits  12    Date for PT Re-Evaluation  03/05/17    Authorization Type  Worker's Compensation authorized 12 visits    Authorization - Visit Number  4    Authorization - Number of Visits  12    PT Start Time  [phone]    PT Stop Time  1043    PT Time Calculation (min)  56 min    Activity Tolerance  Patient tolerated treatment well    Behavior During Therapy  [facility] for tasks assessed/performed       Past Medical History:  Diagnosis Date  . Arthritis   . Atherosclerotic plaque   . Basal cell carcinoma   . CAD (coronary artery disease)    Anterior MI, [facility], 1995, no PCI, total LAD  / catheterization 2001, 30% LAD beyond the origin of a large diagonal, circumflex normal, RCA normal, anterolateral and apical  akinesis, ejection fraction 25-35% range  . Dyslipidemia   . Ejection fraction < 50%    EF 25-35%, catheterization 2001 / EF 20-25%, global hypokinesis, echo in June, 2012  . GERD (gastroesophageal reflux disease)   . History of bronchitis   . Hypoxia    Pneumonia, hospitalization, June, 2012  . Kidney stones 1990's  . MI (myocardial infarction) ([place]) 1995  . Pneumonia 2012  . S/P colectomy    Benign tumor    Past Surgical History:  Procedure Laterality Date  . CHEST TUBE INSERTION    . COLECTOMY  1981   benign mass  . COLONOSCOPY WITH PROPOFOL N/A 07/04/2014   Procedure: COLONOSCOPY WITH PROPOFOL;  Surgeon: Gatha Mayer, MD;  [facility]: [facility];  Service: Endoscopy;  Laterality: N/A;  . KNEE ARTHROSCOPY Left 07/19/2015   Procedure: LEFT ARTHROSCOPY KNEE WITH MEDIAL MENISCECTOMY;  Surgeon: Latanya Maudlin, MD;  Location: [facility];  Service: Orthopedics;  Laterality: Left;  LMA  . LEG SURGERY Left    cyst  . PLEURAL SCARIFICATION  1978  . SHOULDER OPEN ROTATOR CUFF REPAIR Left 05/26/2015   Procedure: LEFT OPEN ACROMINECTOMY AND REPAIR WITH GRAFT AND ANCHOR ROTATOR CUFF TEAR ;  Surgeon: Latanya Maudlin, MD;  [facility]: [facility];  Service: Orthopedics;  Laterality: Left;  Left shoulder block in holding  . TOTAL KNEE ARTHROPLASTY Left 01/29/2017   Procedure: LEFT TOTAL KNEE ARTHROPLASTY;  Surgeon: Latanya Maudlin, MD;  [facility];  Service: Orthopedics;  Laterality: Left;  Adductor Block    There were no vitals filed for this visit.  Subjective Assessment - 02/12/17 0953    Subjective  Patient reported his appointment went well, the surgeon is pleased with his current progress. Patient took pain pill this morning. Patient stated he is going for a follow up with his surgeon again next week.     Pertinent History  01/29/17 L TKA    Limitations  Walking;Standing    How long can you stand comfortably?  With walker 30 mins    How long can you walk comfortably?  Less than 10 minutes    Diagnostic tests  MRI, X-Ray    Patient Stated Goals  Gain motion.  Currently in Pain?  Yes    Pain Score  3     Pain Orientation  Left    Pain Descriptors / Indicators  Tightness;Aching;Discomfort    Pain Onset  1 to 4 weeks ago                      [facility] Adult PT Treatment/Exercise - 02/12/17 0001      Exercises   Exercises  Knee/Hip      Knee/Hip Exercises: Aerobic   Stationary Bike  Half moon AAROM, x15 minutes to improve knee flexion, seat 8      Knee/Hip Exercises: Supine   Heel Slides  AAROM;Left;10 reps;2 sets 3" hold at end flexion range      Modalities   Modalities  Vasopneumatic      Vasopneumatic   Number Minutes Vasopneumatic   10 minutes    Vasopnuematic Location   Knee    Vasopneumatic Pressure  Medium    Vasopneumatic Temperature   34      Manual Therapy    Manual Therapy  Passive ROM    Passive ROM  PROM for flexion and extension with light overpressure.               PT Short Term Goals - 02/10/17 1021      PT SHORT TERM GOAL #1   Title  Patient will improve left knee flexion AROM to 85 degrees to improve functional mobility.    Time  2    Period  Weeks    Status  On-going AROM 80 degrees 02/10/17      PT SHORT TERM GOAL #2   Title  Patient will improve left knee extension AROM to -5 degrees to improve functional mobility.    Time  2    Period  Weeks    Status  New AROM -10 degrees 02/10/17      PT SHORT TERM GOAL #3   Title  Patient will decrease pain at rest to less than or equal to 2/10.    Time  2    Period  Weeks    Status  On-going        PT Long Term Goals - 02/10/17 1022      PT LONG TERM GOAL #1   Title  Patient will improve Left Flexion knee AROM  to 110 degrees in order to perform functional tasks and ADLs.    Time  4    Period  Weeks    Status  On-going      PT LONG TERM GOAL #2   Title  Patient will improve left knee extension AROM to 0 degrees to normalize gait pattern and perform functional tasks and ADLs.    Time  4    Period  Weeks    Status  On-going      PT LONG TERM GOAL #3   Title  Patient will improve left knee strength to 5/5 for adequate knee stabilization to perform functional activities and ADLs.    Time  4    Period  Weeks    Status  On-going      PT LONG TERM GOAL #4   Title  Patient will ascend and descend steps with proper step over step gait pattern with UE support in order to safely enter and exit home..    Time  4    Period  Weeks    Status  On-going      PT LONG TERM  GOAL #5   Title  Patient will ambulate with single point cane with </= 2/10 pain, normalized, step through gait pattern in order to ambulate efficiently within community.    Time  4    Period  Weeks    Status  On-going            Plan - 02/12/17 1048    Clinical Impression Statement  Patient  tolerated treatment well today. Patient is able to complete AAROM Half moons on bike without hip hiking. Patient overall has felt his range of motion during AAROM exercises is improving but his knee still feels "tight." Normal response to vasopneumatic upon removal.     Clinical Presentation  Stable    Clinical Decision Making  Low    Rehab Potential  Excellent    PT Frequency  3x / week    PT Duration  4 weeks    PT Treatment/Interventions  ADLs/Self Care Home Management;Cryotherapy;Electrical Stimulation;Moist Heat;Iontophoresis 4mg /ml Dexamethasone;Therapeutic exercise;Therapeutic activities;Patient/family education;Neuromuscular re-education;Balance training;Manual techniques;Passive range of motion;Vasopneumatic Device    PT Next Visit Plan  cont with TKA and progress patient as tolerated.    Consulted and Agree with Plan of Care  Patient       Patient will benefit from skilled therapeutic intervention in order to improve the following deficits and impairments:  Abnormal gait, Pain, Decreased mobility, Decreased range of motion, Decreased strength, Difficulty walking, Increased edema  Visit Diagnosis: Acute pain of left knee  Stiffness of left knee, not elsewhere classified  Difficulty in walking, not elsewhere classified     Problem List Patient Active Problem List   Diagnosis Date Noted  . Hx of total knee arthroplasty, right 01/29/2017  . Achilles tendinitis of right lower extremity 11/21/2016  . Dilated cardiomyopathy ([place]) 05/22/2016  . History of MI (myocardial infarction) 05/22/2016  . Benign prostatic hyperplasia with urinary frequency 05/16/2016  . Tear of rotator cuff 05/26/2015  . Shoulder injury 04/06/2015  . Rotator cuff syndrome 04/06/2015  . Special screening for malignant neoplasms, colon   . CAD (coronary artery disease)   . GERD (gastroesophageal reflux disease)   . Dyslipidemia   . Ejection fraction < 50%   . Hypoxia    Gabriela Eves, PT,  DPT 02/12/2017, 10:57 AM  [facility] [address] [place], [place], [address] Phone: [phone]   Fax:  [phone]  Name: Tony Patrick MRN: [id number] Date of Birth: 07-31-1950

## 2017-02-14 ENCOUNTER — Encounter: Payer: Self-pay | Admitting: Physical Therapy

## 2017-02-14 ENCOUNTER — Ambulatory Visit: Payer: Worker's Compensation | Attending: Orthopedic Surgery | Admitting: Physical Therapy

## 2017-02-14 DIAGNOSIS — R262 Difficulty in walking, not elsewhere classified: Secondary | ICD-10-CM | POA: Diagnosis present

## 2017-02-14 DIAGNOSIS — M25662 Stiffness of left knee, not elsewhere classified: Secondary | ICD-10-CM | POA: Insufficient documentation

## 2017-02-14 DIAGNOSIS — M25562 Pain in left knee: Secondary | ICD-10-CM

## 2017-02-14 NOTE — Therapy (Signed)
[facility] [place], [place], [address] Phone: [phone]   Fax:  [phone]  Physical Therapy Treatment  Patient Details  Name: Tony Patrick MRN: [id number] Date of Birth: 10-02-1950 Referring Provider: Dr. Gladstone Lighter   Encounter Date: 02/14/2017  PT End of Session - 02/14/17 1039    Visit Number  5    Number of Visits  12    Date for PT Re-Evaluation  03/05/17    Authorization Type  Worker's Compensation authorized 12 visits    Authorization - Visit Number  5    Authorization - Number of Visits  12    PT Start Time  0945    PT Stop Time  1036    PT Time Calculation (min)  51 min    Equipment Utilized During Treatment  Gait belt;Left knee immobilizer    Activity Tolerance  Patient tolerated treatment well    Behavior During Therapy  [facility] for tasks assessed/performed       Past Medical History:  Diagnosis Date  . Arthritis   . Atherosclerotic plaque   . Basal cell carcinoma   . CAD (coronary artery disease)    Anterior MI, [facility], 1995, no PCI, total LAD  / catheterization 2001, 30% LAD beyond the origin of a large diagonal, circumflex normal, RCA normal, anterolateral and apical  akinesis, ejection fraction 25-35% range  . Dyslipidemia   . Ejection fraction < 50%    EF 25-35%, catheterization 2001 / EF 20-25%, global hypokinesis, echo in June, 2012  . GERD (gastroesophageal reflux disease)   . History of bronchitis   . Hypoxia    Pneumonia, hospitalization, June, 2012  . Kidney stones 1990's  . MI (myocardial infarction) ([place]) 1995  . Pneumonia 2012  . S/P colectomy    Benign tumor    Past Surgical History:  Procedure Laterality Date  . CHEST TUBE INSERTION    . COLECTOMY  1981   benign mass  . COLONOSCOPY WITH PROPOFOL N/A 07/04/2014   Procedure: COLONOSCOPY WITH PROPOFOL;  Surgeon: Gatha Mayer, MD;  [facility];  Service: Endoscopy;  Laterality: N/A;  . KNEE ARTHROSCOPY Left 07/19/2015    Procedure: LEFT ARTHROSCOPY KNEE WITH MEDIAL MENISCECTOMY;  Surgeon: Latanya Maudlin, MD;  [facility];  Service: Orthopedics;  Laterality: Left;  LMA  . LEG SURGERY Left    cyst  . PLEURAL SCARIFICATION  1978  . SHOULDER OPEN ROTATOR CUFF REPAIR Left 05/26/2015   Procedure: LEFT OPEN ACROMINECTOMY AND REPAIR WITH GRAFT AND ANCHOR ROTATOR CUFF TEAR ;  Surgeon: Latanya Maudlin, MD;  [facility];  Service: Orthopedics;  Laterality: Left;  Left shoulder block in holding  . TOTAL KNEE ARTHROPLASTY Left 01/29/2017   Procedure: LEFT TOTAL KNEE ARTHROPLASTY;  Surgeon: Latanya Maudlin, MD;  [facility];  Service: Orthopedics;  Laterality: Left;  Adductor Block    There were no vitals filed for this visit.  Subjective Assessment - 02/14/17 1004    Subjective  I'm working on this at home.    Pain Score  3     Pain Onset  1 to 4 weeks ago         [facility] PT Assessment - 02/14/17 0001      AROM   AROM Assessment Site  Knee    Right/Left Knee  Left      PROM   PROM Assessment Site  Knee    Right/Left Knee  Left    Left Knee Extension  In  supine to 90 degrees and passive to 95 degrees.                  [facility] Adult PT Treatment/Exercise - 02/14/17 0001      Exercises   Exercises  Knee/Hip      Knee/Hip Exercises: Aerobic   Stationary Bike  x 7 minutes for stretching    Nustep  Level 5 x 10 minutes moving seat forward as tolerated to increase flexion      Modalities   Modalities  Electrical Stimulation;Vasopneumatic      Electrical Stimulation   Electrical Stimulation Location  Left knee.    Electrical Stimulation Action  IFC    Electrical Stimulation Parameters  1-10 Hz x 15 minutes.    Electrical Stimulation Goals  Edema;Pain      Vasopneumatic   Number Minutes Vasopneumatic   15 minutes    Vasopnuematic Location   -- Left knee.    Vasopneumatic Pressure  Medium      Manual Therapy   Manual Therapy  Passive ROM    Passive ROM  PROM into flexion and  extension x 9 minutes with low load long duration stretching technique.               PT Short Term Goals - 02/10/17 1021      PT SHORT TERM GOAL #1   Title  Patient will improve left knee flexion AROM to 85 degrees to improve functional mobility.    Time  2    Period  Weeks    Status  On-going AROM 80 degrees 02/10/17      PT SHORT TERM GOAL #2   Title  Patient will improve left knee extension AROM to -5 degrees to improve functional mobility.    Time  2    Period  Weeks    Status  New AROM -10 degrees 02/10/17      PT SHORT TERM GOAL #3   Title  Patient will decrease pain at rest to less than or equal to 2/10.    Time  2    Period  Weeks    Status  On-going        PT Long Term Goals - 02/10/17 1022      PT LONG TERM GOAL #1   Title  Patient will improve Left Flexion knee AROM  to 110 degrees in order to perform functional tasks and ADLs.    Time  4    Period  Weeks    Status  On-going      PT LONG TERM GOAL #2   Title  Patient will improve left knee extension AROM to 0 degrees to normalize gait pattern and perform functional tasks and ADLs.    Time  4    Period  Weeks    Status  On-going      PT LONG TERM GOAL #3   Title  Patient will improve left knee strength to 5/5 for adequate knee stabilization to perform functional activities and ADLs.    Time  4    Period  Weeks    Status  On-going      PT LONG TERM GOAL #4   Title  Patient will ascend and descend steps with proper step over step gait pattern with UE support in order to safely enter and exit home..    Time  4    Period  Weeks    Status  On-going      PT LONG TERM  GOAL #5   Title  Patient will ambulate with single point cane with </= 2/10 pain, normalized, step through gait pattern in order to ambulate efficiently within community.    Time  4    Period  Weeks    Status  On-going            Plan - 02/14/17 1036    Clinical Impression Statement  Excellent progress thus far.  Good gain in  ROM this week.  The patient is working very hard at home on his HEP.    Rehab Potential  Excellent    PT Frequency  3x / week    PT Duration  4 weeks    PT Treatment/Interventions  ADLs/Self Care Home Management;Cryotherapy;Electrical Stimulation;Moist Heat;Iontophoresis 4mg /ml Dexamethasone;Therapeutic exercise;Therapeutic activities;Patient/family education;Neuromuscular re-education;Balance training;Manual techniques;Passive range of motion;Vasopneumatic Device    PT Next Visit Plan  cont with TKA and progress patient as tolerated.    Consulted and Agree with Plan of Care  Patient    Family Member Consulted  Son       Patient will benefit from skilled therapeutic intervention in order to improve the following deficits and impairments:  Abnormal gait, Pain, Decreased mobility, Decreased range of motion, Decreased strength, Difficulty walking, Increased edema  Visit Diagnosis: Acute pain of left knee  Stiffness of left knee, not elsewhere classified  Difficulty in walking, not elsewhere classified     Problem List Patient Active Problem List   Diagnosis Date Noted  . Hx of total knee arthroplasty, right 01/29/2017  . Achilles tendinitis of right lower extremity 11/21/2016  . Dilated cardiomyopathy ([place]) 05/22/2016  . History of MI (myocardial infarction) 05/22/2016  . Benign prostatic hyperplasia with urinary frequency 05/16/2016  . Tear of rotator cuff 05/26/2015  . Shoulder injury 04/06/2015  . Rotator cuff syndrome 04/06/2015  . Special screening for malignant neoplasms, colon   . CAD (coronary artery disease)   . GERD (gastroesophageal reflux disease)   . Dyslipidemia   . Ejection fraction < 50%   . Hypoxia     Ransom Nickson, [place] MPT 02/14/2017, 11:13 AM  [facility] [place], [place], [address] Phone: [phone]   Fax:  [phone]  Name: Tony Patrick MRN: [id number] Date of Birth: 1950/03/10

## 2017-02-17 ENCOUNTER — Ambulatory Visit: Payer: Worker's Compensation | Admitting: Physical Therapy

## 2017-02-17 DIAGNOSIS — M25562 Pain in left knee: Secondary | ICD-10-CM | POA: Diagnosis not present

## 2017-02-17 DIAGNOSIS — R262 Difficulty in walking, not elsewhere classified: Secondary | ICD-10-CM

## 2017-02-17 DIAGNOSIS — M25662 Stiffness of left knee, not elsewhere classified: Secondary | ICD-10-CM

## 2017-02-17 NOTE — Therapy (Signed)
[facility] [place], [place], [address] Phone: [phone]   Fax:  [phone]  Physical Therapy Treatment  Patient Details  Name: Tony Patrick MRN: [id number] Date of Birth: June 14, 1950 Referring Provider: Dr. Gladstone Lighter   Encounter Date: 02/17/2017  PT End of Session - 02/17/17 0953    Visit Number  6    Number of Visits  12    Date for PT Re-Evaluation  03/05/17    Authorization Type  Worker's Compensation authorized 12 visits    Authorization - Visit Number  6    Authorization - Number of Visits  12    PT Start Time  [phone]    PT Stop Time  1040    PT Time Calculation (min)  53 min    Activity Tolerance  Patient tolerated treatment well    Behavior During Therapy  [facility] for tasks assessed/performed       Past Medical History:  Diagnosis Date  . Arthritis   . Atherosclerotic plaque   . Basal cell carcinoma   . CAD (coronary artery disease)    Anterior MI, [facility], 1995, no PCI, total LAD  / catheterization 2001, 30% LAD beyond the origin of a large diagonal, circumflex normal, RCA normal, anterolateral and apical  akinesis, ejection fraction 25-35% range  . Dyslipidemia   . Ejection fraction < 50%    EF 25-35%, catheterization 2001 / EF 20-25%, global hypokinesis, echo in June, 2012  . GERD (gastroesophageal reflux disease)   . History of bronchitis   . Hypoxia    Pneumonia, hospitalization, June, 2012  . Kidney stones 1990's  . MI (myocardial infarction) ([place]) 1995  . Pneumonia 2012  . S/P colectomy    Benign tumor    Past Surgical History:  Procedure Laterality Date  . CHEST TUBE INSERTION    . COLECTOMY  1981   benign mass  . COLONOSCOPY WITH PROPOFOL N/A 07/04/2014   Procedure: COLONOSCOPY WITH PROPOFOL;  Surgeon: Gatha Mayer, MD;  [facility];  Service: Endoscopy;  Laterality: N/A;  . KNEE ARTHROSCOPY Left 07/19/2015   Procedure: LEFT ARTHROSCOPY KNEE WITH MEDIAL MENISCECTOMY;  Surgeon: Latanya Maudlin, MD;  [facility];  Service: Orthopedics;  Laterality: Left;  LMA  . LEG SURGERY Left    cyst  . PLEURAL SCARIFICATION  1978  . SHOULDER OPEN ROTATOR CUFF REPAIR Left 05/26/2015   Procedure: LEFT OPEN ACROMINECTOMY AND REPAIR WITH GRAFT AND ANCHOR ROTATOR CUFF TEAR ;  Surgeon: Latanya Maudlin, MD;  [facility];  Service: Orthopedics;  Laterality: Left;  Left shoulder block in holding  . TOTAL KNEE ARTHROPLASTY Left 01/29/2017   Procedure: LEFT TOTAL KNEE ARTHROPLASTY;  Surgeon: Latanya Maudlin, MD;  [facility];  Service: Orthopedics;  Laterality: Left;  Adductor Block    There were no vitals filed for this visit.  Subjective Assessment - 02/17/17 0954    Subjective  Patient reported feeling good; still stiff and the swelling has increased. Patient reported icing, elevating daily for edema control.    Patient is accompained by:  Family member    Pertinent History  01/29/17 L TKA    Currently in Pain?  Yes    Pain Score  2     Pain Location  Knee    Pain Orientation  Left    Pain Descriptors / Indicators  Tightness    Pain Onset  1 to 4 weeks ago         [facility] PT Assessment -  02/17/17 0001      AROM   AROM Assessment Site  Knee    Right/Left Knee  Left    Left Knee Extension  -5    Left Knee Flexion  87      PROM   PROM Assessment Site  Knee    Right/Left Knee  Left    Left Knee Extension  -3    Left Knee Flexion  97                  [facility] Adult PT Treatment/Exercise - 02/17/17 0001      Exercises   Exercises  Knee/Hip      Knee/Hip Exercises: Aerobic   Nustep  Level 5 x12 min seat 10, with UE to assist with flexion      Knee/Hip Exercises: Seated   Long Arc Quad  AROM;Strengthening;Left;2 sets;10 reps    Heel Slides  AAROM;Left 3 minutes      Knee/Hip Exercises: Supine   Heel Slides  AAROM;Left 4 minutes    Other Supine Knee/Hip Exercises  hip abduction with Red Theraband, 2 minutes      Modalities   Modalities  Electrical  Stimulation;Vasopneumatic      Electrical Stimulation   Electrical Stimulation Location  Left knee    Electrical Stimulation Action  IFC    Electrical Stimulation Parameters  1-10 Hz x10    Electrical Stimulation Goals  Pain      Vasopneumatic   Number Minutes Vasopneumatic   10 minutes    Vasopnuematic Location   Knee    Vasopneumatic Pressure  Medium      Manual Therapy   Manual Therapy  Passive ROM    Passive ROM  PROM into flexion and extension with gentle overpressure to improve ROM               PT Short Term Goals - 02/17/17 1144      PT SHORT TERM GOAL #1   Title  Patient will improve left knee flexion AROM to 85 degrees to improve functional mobility.    Time  2    Period  Weeks    Status  On-going      PT SHORT TERM GOAL #2   Title  Patient will improve left knee extension AROM to -5 degrees to improve functional mobility.    Time  2    Period  Weeks    Status  Achieved      PT SHORT TERM GOAL #3   Title  Patient will decrease pain at rest to less than or equal to 2/10.    Time  2    Period  Weeks    Status  On-going 1-3/10        PT Long Term Goals - 02/10/17 1022      PT LONG TERM GOAL #1   Title  Patient will improve Left Flexion knee AROM  to 110 degrees in order to perform functional tasks and ADLs.    Time  4    Period  Weeks    Status  On-going      PT LONG TERM GOAL #2   Title  Patient will improve left knee extension AROM to 0 degrees to normalize gait pattern and perform functional tasks and ADLs.    Time  4    Period  Weeks    Status  On-going      PT LONG TERM GOAL #3   Title  Patient will improve left knee  strength to 5/5 for adequate knee stabilization to perform functional activities and ADLs.    Time  4    Period  Weeks    Status  On-going      PT LONG TERM GOAL #4   Title  Patient will ascend and descend steps with proper step over step gait pattern with UE support in order to safely enter and exit home..    Time  4     Period  Weeks    Status  On-going      PT LONG TERM GOAL #5   Title  Patient will ambulate with single point cane with </= 2/10 pain, normalized, step through gait pattern in order to ambulate efficiently within community.    Time  4    Period  Weeks    Status  On-going            Plan - 02/17/17 1036    Clinical Impression Statement  Patient able to tolerate treatment well. Patient prefers bike rather than NuStep; PT discussed the benefits of both machines. Normal response to modalities upon removal of e-stim and vasopneumatic device. Patient to see MD tomorrow, note routed.     Rehab Potential  Excellent    PT Frequency  3x / week    PT Duration  4 weeks    PT Treatment/Interventions  ADLs/Self Care Home Management;Cryotherapy;Electrical Stimulation;Moist Heat;Iontophoresis 4mg /ml Dexamethasone;Therapeutic exercise;Therapeutic activities;Patient/family education;Neuromuscular re-education;Balance training;Manual techniques;Passive range of motion;Vasopneumatic Device    PT Next Visit Plan  cont with TKA and progress patient as tolerated.    Consulted and Agree with Plan of Care  Patient    Family Member Consulted  wife       Patient will benefit from skilled therapeutic intervention in order to improve the following deficits and impairments:  Abnormal gait, Pain, Decreased mobility, Decreased range of motion, Decreased strength, Difficulty walking, Increased edema  Visit Diagnosis: Acute pain of left knee  Stiffness of left knee, not elsewhere classified  Difficulty in walking, not elsewhere classified     Problem List Patient Active Problem List   Diagnosis Date Noted  . Hx of total knee arthroplasty, right 01/29/2017  . Achilles tendinitis of right lower extremity 11/21/2016  . Dilated cardiomyopathy ([place]) 05/22/2016  . History of MI (myocardial infarction) 05/22/2016  . Benign prostatic hyperplasia with urinary frequency 05/16/2016  . Tear of rotator cuff  05/26/2015  . Shoulder injury 04/06/2015  . Rotator cuff syndrome 04/06/2015  . Special screening for malignant neoplasms, colon   . CAD (coronary artery disease)   . GERD (gastroesophageal reflux disease)   . Dyslipidemia   . Ejection fraction < 50%   . Hypoxia    Gabriela Eves, PT, DPT 02/17/2017, 11:51 AM  [facility] [address] [place], [place], [address] Phone: [phone]   Fax:  [phone]  Name: Kristofor Michalowski MRN: [id number] Date of Birth: 05/13/1950

## 2017-02-19 ENCOUNTER — Ambulatory Visit: Payer: Worker's Compensation | Admitting: Physical Therapy

## 2017-02-19 DIAGNOSIS — M25562 Pain in left knee: Secondary | ICD-10-CM | POA: Diagnosis not present

## 2017-02-19 DIAGNOSIS — R262 Difficulty in walking, not elsewhere classified: Secondary | ICD-10-CM

## 2017-02-19 DIAGNOSIS — M25662 Stiffness of left knee, not elsewhere classified: Secondary | ICD-10-CM

## 2017-02-19 NOTE — Therapy (Signed)
[facility] [place], [place], [address] Phone: [phone]   Fax:  [phone]  Physical Therapy Treatment  Patient Details  Name: Tony Patrick MRN: [id number] Date of Birth: 1950-05-11 Referring Provider: Dr. Gladstone Lighter   Encounter Date: 02/19/2017  PT End of Session - 02/19/17 0954    Visit Number  7    Number of Visits  12    Date for PT Re-Evaluation  03/05/17    Authorization Type  Worker's Compensation authorized 12 visits    Authorization - Visit Number  7    Authorization - Number of Visits  12    PT Start Time  [phone]    PT Stop Time  1036    PT Time Calculation (min)  42 min       Past Medical History:  Diagnosis Date  . Arthritis   . Atherosclerotic plaque   . Basal cell carcinoma   . CAD (coronary artery disease)    Anterior MI, [facility], 1995, no PCI, total LAD  / catheterization 2001, 30% LAD beyond the origin of a large diagonal, circumflex normal, RCA normal, anterolateral and apical  akinesis, ejection fraction 25-35% range  . Dyslipidemia   . Ejection fraction < 50%    EF 25-35%, catheterization 2001 / EF 20-25%, global hypokinesis, echo in June, 2012  . GERD (gastroesophageal reflux disease)   . History of bronchitis   . Hypoxia    Pneumonia, hospitalization, June, 2012  . Kidney stones 1990's  . MI (myocardial infarction) ([place]) 1995  . Pneumonia 2012  . S/P colectomy    Benign tumor    Past Surgical History:  Procedure Laterality Date  . CHEST TUBE INSERTION    . COLECTOMY  1981   benign mass  . COLONOSCOPY WITH PROPOFOL N/A 07/04/2014   Procedure: COLONOSCOPY WITH PROPOFOL;  Surgeon: Gatha Mayer, MD;  [facility]: [facility];  Service: Endoscopy;  Laterality: N/A;  . KNEE ARTHROSCOPY Left 07/19/2015   Procedure: LEFT ARTHROSCOPY KNEE WITH MEDIAL MENISCECTOMY;  Surgeon: Latanya Maudlin, MD;  Location: [facility];  Service: Orthopedics;  Laterality: Left;  LMA  . LEG SURGERY Left    cyst  .  PLEURAL SCARIFICATION  1978  . SHOULDER OPEN ROTATOR CUFF REPAIR Left 05/26/2015   Procedure: LEFT OPEN ACROMINECTOMY AND REPAIR WITH GRAFT AND ANCHOR ROTATOR CUFF TEAR ;  Surgeon: Latanya Maudlin, MD;  Location: [facility];  Service: Orthopedics;  Laterality: Left;  Left shoulder block in holding  . TOTAL KNEE ARTHROPLASTY Left 01/29/2017   Procedure: LEFT TOTAL KNEE ARTHROPLASTY;  Surgeon: Latanya Maudlin, MD;  [facility]: [facility];  Service: Orthopedics;  Laterality: Left;  Adductor Block    There were no vitals filed for this visit.  Subjective Assessment - 02/19/17 0956    Subjective  Patient reported feeling a little sore and swollen today because he was on his feet all day yesterday. Patient reported the doctor is pleased with his progress; patient's next appointment is February 18th. Patient stated he's been walking without any AD at home but still brings the cane to walk outdoors. Pain is minimal 1-2/10.    Pertinent History  01/29/17 L TKA    Currently in Pain?  Yes    Pain Score  2     Pain Location  Knee    Pain Orientation  Left    Pain Descriptors / Indicators  Tightness    Pain Type  Surgical pain    Pain Onset  1 to 4  weeks ago                      [facility] Adult PT Treatment/Exercise - 02/19/17 0001      Exercises   Exercises  Knee/Hip      Knee/Hip Exercises: Stretches   Gastroc Stretch  Left;3 reps;30 seconds      Knee/Hip Exercises: Aerobic   Stationary Bike  x12 minutes seat 8; AAROM half moons to improve flexion      Knee/Hip Exercises: Standing   Wall Squat  10 reps;2 sets with cane for support      Knee/Hip Exercises: Seated   Long Arc Quad  AROM;Strengthening;Left;2 sets;10 reps;Weights 2#    Hamstring Curl  Strengthening;Left;2 sets;10 reps;Limitations Red Theraband    Sit to [employer]  20 reps;without UE support on elevated plinth      Modalities   Modalities  Vasopneumatic      Vasopneumatic   Number Minutes Vasopneumatic   10 minutes     Vasopnuematic Location   Knee    Vasopneumatic Pressure  Medium               PT Short Term Goals - 02/17/17 1144      PT SHORT TERM GOAL #1   Title  Patient will improve left knee flexion AROM to 85 degrees to improve functional mobility.    Time  2    Period  Weeks    Status  On-going      PT SHORT TERM GOAL #2   Title  Patient will improve left knee extension AROM to -5 degrees to improve functional mobility.    Time  2    Period  Weeks    Status  Achieved      PT SHORT TERM GOAL #3   Title  Patient will decrease pain at rest to less than or equal to 2/10.    Time  2    Period  Weeks    Status  On-going 1-3/10        PT Long Term Goals - 02/10/17 1022      PT LONG TERM GOAL #1   Title  Patient will improve Left Flexion knee AROM  to 110 degrees in order to perform functional tasks and ADLs.    Time  4    Period  Weeks    Status  On-going      PT LONG TERM GOAL #2   Title  Patient will improve left knee extension AROM to 0 degrees to normalize gait pattern and perform functional tasks and ADLs.    Time  4    Period  Weeks    Status  On-going      PT LONG TERM GOAL #3   Title  Patient will improve left knee strength to 5/5 for adequate knee stabilization to perform functional activities and ADLs.    Time  4    Period  Weeks    Status  On-going      PT LONG TERM GOAL #4   Title  Patient will ascend and descend steps with proper step over step gait pattern with UE support in order to safely enter and exit home..    Time  4    Period  Weeks    Status  On-going      PT LONG TERM GOAL #5   Title  Patient will ambulate with single point cane with </= 2/10 pain, normalized, step through gait pattern in order to  ambulate efficiently within community.    Time  4    Period  Weeks    Status  On-going            Plan - 02/19/17 1013    Clinical Impression Statement  Patient able to treatment well with added strengthening exercises. Patient reported  tolerable tightness and pulling. Patient required multiple VC for equal weight distribution during wall squats and sit to stands. Patient continues to peform HEP for ROM. No adverse affects noted upon removal of vasopneumatic device.    Clinical Presentation  Stable    Clinical Decision Making  Low    Rehab Potential  Excellent    PT Frequency  3x / week    PT Duration  4 weeks    PT Treatment/Interventions  ADLs/Self Care Home Management;Cryotherapy;Electrical Stimulation;Moist Heat;Iontophoresis 4mg /ml Dexamethasone;Therapeutic exercise;Therapeutic activities;Patient/family education;Neuromuscular re-education;Balance training;Manual techniques;Passive range of motion;Vasopneumatic Device    PT Next Visit Plan  cont with TKA and progress patient as tolerated.    Consulted and Agree with Plan of Care  Patient       Patient will benefit from skilled therapeutic intervention in order to improve the following deficits and impairments:  Abnormal gait, Pain, Decreased mobility, Decreased range of motion, Decreased strength, Difficulty walking, Increased edema  Visit Diagnosis: Acute pain of left knee  Stiffness of left knee, not elsewhere classified  Difficulty in walking, not elsewhere classified     Problem List Patient Active Problem List   Diagnosis Date Noted  . Hx of total knee arthroplasty, right 01/29/2017  . Achilles tendinitis of right lower extremity 11/21/2016  . Dilated cardiomyopathy ([place]) 05/22/2016  . History of MI (myocardial infarction) 05/22/2016  . Benign prostatic hyperplasia with urinary frequency 05/16/2016  . Tear of rotator cuff 05/26/2015  . Shoulder injury 04/06/2015  . Rotator cuff syndrome 04/06/2015  . Special screening for malignant neoplasms, colon   . CAD (coronary artery disease)   . GERD (gastroesophageal reflux disease)   . Dyslipidemia   . Ejection fraction < 50%   . Hypoxia     Gabriela Eves, PT, DPT 02/19/2017, 12:43 PM  [facility] [address] [place], [place], [address] Phone: [phone]   Fax:  [phone]  Name: Tony Patrick MRN: [id number] Date of Birth: [birth year]

## 2017-02-21 ENCOUNTER — Ambulatory Visit: Payer: Worker's Compensation | Admitting: *Deleted

## 2017-02-21 DIAGNOSIS — M25562 Pain in left knee: Secondary | ICD-10-CM | POA: Diagnosis not present

## 2017-02-21 DIAGNOSIS — R262 Difficulty in walking, not elsewhere classified: Secondary | ICD-10-CM

## 2017-02-21 DIAGNOSIS — M25662 Stiffness of left knee, not elsewhere classified: Secondary | ICD-10-CM

## 2017-02-21 NOTE — Therapy (Signed)
[facility] [place], [place], [address] Phone: [phone]   Fax:  [phone]  Physical Therapy Treatment  Patient Details  Name: Tony Patrick MRN: [id number] Date of Birth: Feb 13, 1950 Referring Provider: Dr. Gladstone Lighter   Encounter Date: 02/21/2017  PT End of Session - 02/21/17 1004    Visit Number  8    Number of Visits  12    Date for PT Re-Evaluation  03/05/17    Authorization Type  Worker's Compensation authorized 12 visits    Authorization - Visit Number  8    Authorization - Number of Visits  12    PT Start Time  0945    PT Stop Time  2119    PT Time Calculation (min)  58 min       Past Medical History:  Diagnosis Date  . Arthritis   . Atherosclerotic plaque   . Basal cell carcinoma   . CAD (coronary artery disease)    Anterior MI, [facility], 1995, no PCI, total LAD  / catheterization 2001, 30% LAD beyond the origin of a large diagonal, circumflex normal, RCA normal, anterolateral and apical  akinesis, ejection fraction 25-35% range  . Dyslipidemia   . Ejection fraction < 50%    EF 25-35%, catheterization 2001 / EF 20-25%, global hypokinesis, echo in June, 2012  . GERD (gastroesophageal reflux disease)   . History of bronchitis   . Hypoxia    Pneumonia, hospitalization, June, 2012  . Kidney stones 1990's  . MI (myocardial infarction) ([place]) 1995  . Pneumonia 2012  . S/P colectomy    Benign tumor    Past Surgical History:  Procedure Laterality Date  . CHEST TUBE INSERTION    . COLECTOMY  1981   benign mass  . COLONOSCOPY WITH PROPOFOL N/A 07/04/2014   Procedure: COLONOSCOPY WITH PROPOFOL;  Surgeon: Gatha Mayer, MD;  [facility];  Service: Endoscopy;  Laterality: N/A;  . KNEE ARTHROSCOPY Left 07/19/2015   Procedure: LEFT ARTHROSCOPY KNEE WITH MEDIAL MENISCECTOMY;  Surgeon: Latanya Maudlin, MD;  [facility];  Service: Orthopedics;  Laterality: Left;  LMA  . LEG SURGERY Left    cyst  .  PLEURAL SCARIFICATION  1978  . SHOULDER OPEN ROTATOR CUFF REPAIR Left 05/26/2015   Procedure: LEFT OPEN ACROMINECTOMY AND REPAIR WITH GRAFT AND ANCHOR ROTATOR CUFF TEAR ;  Surgeon: Latanya Maudlin, MD;  [facility];  Service: Orthopedics;  Laterality: Left;  Left shoulder block in holding  . TOTAL KNEE ARTHROPLASTY Left 01/29/2017   Procedure: LEFT TOTAL KNEE ARTHROPLASTY;  Surgeon: Latanya Maudlin, MD;  [facility];  Service: Orthopedics;  Laterality: Left;  Adductor Block    There were no vitals filed for this visit.  Subjective Assessment - 02/21/17 0958    Subjective   Did good after last Rx . LT knee is a little sore         Pertinent History  01/29/17 L TKA    Limitations  Walking;Standing    How long can you stand comfortably?  With walker 30 mins    How long can you walk comfortably?  Less than 10 minutes    Diagnostic tests  MRI, X-Ray    Patient Stated Goals  Gain motion.    Currently in Pain?  Yes    Pain Score  2     Pain Location  Knee    Pain Orientation  Left    Pain Descriptors / Indicators  Tightness  Pain Onset  1 to 4 weeks ago    Pain Frequency  Constant                      [facility] Adult PT Treatment/Exercise - 02/21/17 0001      Exercises   Exercises  Knee/Hip      Knee/Hip Exercises: Aerobic   Nustep  Level 5 x15 min seat 10,9 with UE to assist with flexion      Knee/Hip Exercises: Seated   Long Arc Quad  AROM;Strengthening;Left;10 reps;Weights;3 sets 3#      Modalities   Modalities  Vasopneumatic      Acupuncturist Stimulation Location  Left knee   IFC x 15 mins 1-10 hz    Electrical Stimulation Goals  Pain      Vasopneumatic   Number Minutes Vasopneumatic   10 minutes    Vasopnuematic Location   Knee    Vasopneumatic Pressure  Medium    Vasopneumatic Temperature   34      Manual Therapy   Manual Therapy  Passive ROM    Passive ROM  PROM into flexion and extension with gentle overpressure to improve  ROM.                 PT Short Term Goals - 02/17/17 1144      PT SHORT TERM GOAL #1   Title  Patient will improve left knee flexion AROM to 85 degrees to improve functional mobility.    Time  2    Period  Weeks    Status  On-going      PT SHORT TERM GOAL #2   Title  Patient will improve left knee extension AROM to -5 degrees to improve functional mobility.    Time  2    Period  Weeks    Status  Achieved      PT SHORT TERM GOAL #3   Title  Patient will decrease pain at rest to less than or equal to 2/10.    Time  2    Period  Weeks    Status  On-going 1-3/10        PT Long Term Goals - 02/10/17 1022      PT LONG TERM GOAL #1   Title  Patient will improve Left Flexion knee AROM  to 110 degrees in order to perform functional tasks and ADLs.    Time  4    Period  Weeks    Status  On-going      PT LONG TERM GOAL #2   Title  Patient will improve left knee extension AROM to 0 degrees to normalize gait pattern and perform functional tasks and ADLs.    Time  4    Period  Weeks    Status  On-going      PT LONG TERM GOAL #3   Title  Patient will improve left knee strength to 5/5 for adequate knee stabilization to perform functional activities and ADLs.    Time  4    Period  Weeks    Status  On-going      PT LONG TERM GOAL #4   Title  Patient will ascend and descend steps with proper step over step gait pattern with UE support in order to safely enter and exit home..    Time  4    Period  Weeks    Status  On-going      PT LONG TERM  GOAL #5   Title  Patient will ambulate with single point cane with </= 2/10 pain, normalized, step through gait pattern in order to ambulate efficiently within community.    Time  4    Period  Weeks    Status  On-going            Plan - 02/21/17 1234    Clinical Impression Statement  Pt arrived today in good spirits and reports working on ROM exs at home. Rx focused on ROM and quad activation. He did well with both and was  able to reach 100 degrees of PROM in sitting today. Normal response to modalities.    Rehab Potential  Excellent    PT Duration  4 weeks    PT Treatment/Interventions  ADLs/Self Care Home Management;Cryotherapy;Electrical Stimulation;Moist Heat;Iontophoresis 4mg /ml Dexamethasone;Therapeutic exercise;Therapeutic activities;Patient/family education;Neuromuscular re-education;Balance training;Manual techniques;Passive range of motion;Vasopneumatic Device    PT Next Visit Plan  cont with TKA and progress patient as tolerated.    Consulted and Agree with Plan of Care  Patient       Patient will benefit from skilled therapeutic intervention in order to improve the following deficits and impairments:  Abnormal gait, Pain, Decreased mobility, Decreased range of motion, Decreased strength, Difficulty walking, Increased edema  Visit Diagnosis: Acute pain of left knee  Stiffness of left knee, not elsewhere classified  Difficulty in walking, not elsewhere classified     Problem List Patient Active Problem List   Diagnosis Date Noted  . Hx of total knee arthroplasty, right 01/29/2017  . Achilles tendinitis of right lower extremity 11/21/2016  . Dilated cardiomyopathy ([place]) 05/22/2016  . History of MI (myocardial infarction) 05/22/2016  . Benign prostatic hyperplasia with urinary frequency 05/16/2016  . Tear of rotator cuff 05/26/2015  . Shoulder injury 04/06/2015  . Rotator cuff syndrome 04/06/2015  . Special screening for malignant neoplasms, colon   . CAD (coronary artery disease)   . GERD (gastroesophageal reflux disease)   . Dyslipidemia   . Ejection fraction < 50%   . Hypoxia     Merian Wroe,CHRIS, PTA  02/21/2017, 12:39 PM  [facility] [address] [place], [place], [address] Phone: [phone]   Fax:  [phone]  Name: Terral Cooks MRN: [id number] Date of Birth: April 02, 1950

## 2017-02-24 ENCOUNTER — Ambulatory Visit: Payer: Worker's Compensation | Admitting: Physical Therapy

## 2017-02-24 DIAGNOSIS — R262 Difficulty in walking, not elsewhere classified: Secondary | ICD-10-CM

## 2017-02-24 DIAGNOSIS — M25562 Pain in left knee: Secondary | ICD-10-CM

## 2017-02-24 DIAGNOSIS — M25662 Stiffness of left knee, not elsewhere classified: Secondary | ICD-10-CM

## 2017-02-24 NOTE — Therapy (Addendum)
[facility] [place], [place], [address] Phone: [phone]   Fax:  [phone]  Physical Therapy Treatment  Patient Details  Name: Tony Patrick MRN: [id number] Date of Birth: [birth year] Referring Provider: Dr. Gladstone Lighter   Encounter Date: 02/24/2017  PT End of Session - 02/24/17 0731    Visit Number  9    Number of Visits  12    Date for PT Re-Evaluation  03/05/17    Authorization Type  Worker's Compensation authorized 12 visits    Authorization - Visit Number  9    Authorization - Number of Visits  12    PT Start Time  0730    PT Stop Time  0825    PT Time Calculation (min)  55 min    Equipment Utilized During Treatment  --    Activity Tolerance  Patient tolerated treatment well    Behavior During Therapy  [facility] for tasks assessed/performed       Past Medical History:  Diagnosis Date  . Arthritis   . Atherosclerotic plaque   . Basal cell carcinoma   . CAD (coronary artery disease)    Anterior MI, [facility], 1995, no PCI, total LAD  / catheterization 2001, 30% LAD beyond the origin of a large diagonal, circumflex normal, RCA normal, anterolateral and apical  akinesis, ejection fraction 25-35% range  . Dyslipidemia   . Ejection fraction < 50%    EF 25-35%, catheterization 2001 / EF 20-25%, global hypokinesis, echo in June, 2012  . GERD (gastroesophageal reflux disease)   . History of bronchitis   . Hypoxia    Pneumonia, hospitalization, June, 2012  . Kidney stones 1990's  . MI (myocardial infarction) ([place]) 1995  . Pneumonia 2012  . S/P colectomy    Benign tumor    Past Surgical History:  Procedure Laterality Date  . CHEST TUBE INSERTION    . COLECTOMY  1981   benign mass  . COLONOSCOPY WITH PROPOFOL N/A 07/04/2014   Procedure: COLONOSCOPY WITH PROPOFOL;  Surgeon: Gatha Mayer, MD;  [facility];  Service: Endoscopy;  Laterality: N/A;  . KNEE ARTHROSCOPY Left 07/19/2015   Procedure: LEFT ARTHROSCOPY  KNEE WITH MEDIAL MENISCECTOMY;  Surgeon: Latanya Maudlin, MD;  [facility];  Service: Orthopedics;  Laterality: Left;  LMA  . LEG SURGERY Left    cyst  . PLEURAL SCARIFICATION  1978  . SHOULDER OPEN ROTATOR CUFF REPAIR Left 05/26/2015   Procedure: LEFT OPEN ACROMINECTOMY AND REPAIR WITH GRAFT AND ANCHOR ROTATOR CUFF TEAR ;  Surgeon: Latanya Maudlin, MD;  [facility];  Service: Orthopedics;  Laterality: Left;  Left shoulder block in holding  . TOTAL KNEE ARTHROPLASTY Left 01/29/2017   Procedure: LEFT TOTAL KNEE ARTHROPLASTY;  Surgeon: Latanya Maudlin, MD;  [facility];  Service: Orthopedics;  Laterality: Left;  Adductor Block    There were no vitals filed for this visit.  Subjective Assessment - 02/24/17 0731    Subjective  Patient reported feeling a "little sore today on the sides" Pain reported a 1/10 today.    Currently in Pain?  Yes    Pain Score  1     Pain Orientation  Left    Pain Descriptors / Indicators  Tightness;Sore    Pain Onset  1 to 4 weeks ago                      [facility] Adult PT Treatment/Exercise - 02/24/17 0001  Exercises   Exercises  Knee/Hip      Knee/Hip Exercises: Aerobic   Stationary Bike  x12 minutes seat 8; AAROM half moons to improve flexion      Knee/Hip Exercises: Standing   Wall Squat  10 reps;2 sets      Knee/Hip Exercises: Supine   Straight Leg Raises  Strengthening;Left;2 sets;10 reps      Knee/Hip Exercises: Sidelying   Hip ABduction  Strengthening;Left;2 sets;10 reps      Knee/Hip Exercises: Prone   Hamstring Curl  2 sets;10 reps;2 seconds    Prone Knee Hang  3 minutes      Modalities   Modalities  Vasopneumatic      Vasopneumatic   Number Minutes Vasopneumatic   10 minutes    Vasopnuematic Location   Knee    Vasopneumatic Pressure  Medium    Vasopneumatic Temperature   34      Manual Therapy   Manual Therapy  Passive ROM    Passive ROM  PROM into flexion and extension with gentle overpressure to  improve ROM.                 PT Short Term Goals - 02/17/17 1144      PT SHORT TERM GOAL #1   Title  Patient will improve left knee flexion AROM to 85 degrees to improve functional mobility.    Time  2    Period  Weeks    Status  On-going      PT SHORT TERM GOAL #2   Title  Patient will improve left knee extension AROM to -5 degrees to improve functional mobility.    Time  2    Period  Weeks    Status  Achieved      PT SHORT TERM GOAL #3   Title  Patient will decrease pain at rest to less than or equal to 2/10.    Time  2    Period  Weeks    Status  On-going 1-3/10        PT Long Term Goals - 02/10/17 1022      PT LONG TERM GOAL #1   Title  Patient will improve Left Flexion knee AROM  to 110 degrees in order to perform functional tasks and ADLs.    Time  4    Period  Weeks    Status  On-going      PT LONG TERM GOAL #2   Title  Patient will improve left knee extension AROM to 0 degrees to normalize gait pattern and perform functional tasks and ADLs.    Time  4    Period  Weeks    Status  On-going      PT LONG TERM GOAL #3   Title  Patient will improve left knee strength to 5/5 for adequate knee stabilization to perform functional activities and ADLs.    Time  4    Period  Weeks    Status  On-going      PT LONG TERM GOAL #4   Title  Patient will ascend and descend steps with proper step over step gait pattern with UE support in order to safely enter and exit home..    Time  4    Period  Weeks    Status  On-going      PT LONG TERM GOAL #5   Title  Patient will ambulate with single point cane with </= 2/10 pain, normalized, step through gait pattern in  order to ambulate efficiently within community.    Time  4    Period  Weeks    Status  On-going            Plan - 02/24/17 0815    Clinical Impression Statement  Patient tolerated exercises with no increase of pain. Patient noted with increased "stiffness" at end of prone hang but "loosened up  after bending it. Patient continues perform HEP at home with focus on ROM. No adverse affects noted upon removal of modalities.    Clinical Presentation  Stable    Clinical Decision Making  Low    Rehab Potential  Excellent    PT Frequency  3x / week    PT Duration  4 weeks    PT Treatment/Interventions  ADLs/Self Care Home Management;Cryotherapy;Electrical Stimulation;Moist Heat;Iontophoresis 4mg /ml Dexamethasone;Therapeutic exercise;Therapeutic activities;Patient/family education;Neuromuscular re-education;Balance training;Manual techniques;Passive range of motion;Vasopneumatic Device    PT Next Visit Plan  cont with TKA and progress patient as tolerated.    Consulted and Agree with Plan of Care  Patient       Patient will benefit from skilled therapeutic intervention in order to improve the following deficits and impairments:  Abnormal gait, Pain, Decreased mobility, Decreased range of motion, Decreased strength, Difficulty walking, Increased edema  Visit Diagnosis: Acute pain of left knee  Stiffness of left knee, not elsewhere classified  Difficulty in walking, not elsewhere classified     Problem List Patient Active Problem List   Diagnosis Date Noted  . Hx of total knee arthroplasty, right 01/29/2017  . Achilles tendinitis of right lower extremity 11/21/2016  . Dilated cardiomyopathy ([place]) 05/22/2016  . History of MI (myocardial infarction) 05/22/2016  . Benign prostatic hyperplasia with urinary frequency 05/16/2016  . Tear of rotator cuff 05/26/2015  . Shoulder injury 04/06/2015  . Rotator cuff syndrome 04/06/2015  . Special screening for malignant neoplasms, colon   . CAD (coronary artery disease)   . GERD (gastroesophageal reflux disease)   . Dyslipidemia   . Ejection fraction < 50%   . Hypoxia    Gabriela Eves, PT, DPT 02/24/2017, 9:31 AM  [facility] [address] [place], [place], [address] Phone: [phone]    Fax:  [phone]  Name: Tony Patrick MRN: [id number] Date of Birth: November 21, 1950

## 2017-02-26 ENCOUNTER — Ambulatory Visit: Payer: Worker's Compensation | Admitting: Physical Therapy

## 2017-02-26 ENCOUNTER — Encounter: Payer: Self-pay | Admitting: Physical Therapy

## 2017-02-26 DIAGNOSIS — M25662 Stiffness of left knee, not elsewhere classified: Secondary | ICD-10-CM

## 2017-02-26 DIAGNOSIS — R262 Difficulty in walking, not elsewhere classified: Secondary | ICD-10-CM

## 2017-02-26 DIAGNOSIS — M25562 Pain in left knee: Secondary | ICD-10-CM | POA: Diagnosis not present

## 2017-02-26 NOTE — Therapy (Signed)
[facility] [place], [place], [address] Phone: [phone]   Fax:  [phone]  Physical Therapy Treatment  Patient Details  Name: Tony Patrick MRN: [id number] Date of Birth: [birth year] Referring Provider: Dr. Gladstone Lighter   Encounter Date: 02/26/2017  PT End of Session - 02/26/17 1028    Visit Number  10    Number of Visits  12    Date for PT Re-Evaluation  03/05/17    Authorization Type  Worker's Compensation authorized 12 visits    PT Start Time  0945    PT Stop Time  1043    PT Time Calculation (min)  58 min    Activity Tolerance  Patient tolerated treatment well    Behavior During Therapy  [facility] for tasks assessed/performed       Past Medical History:  Diagnosis Date  . Arthritis   . Atherosclerotic plaque   . Basal cell carcinoma   . CAD (coronary artery disease)    Anterior MI, [facility], 1995, no PCI, total LAD  / catheterization 2001, 30% LAD beyond the origin of a large diagonal, circumflex normal, RCA normal, anterolateral and apical  akinesis, ejection fraction 25-35% range  . Dyslipidemia   . Ejection fraction < 50%    EF 25-35%, catheterization 2001 / EF 20-25%, global hypokinesis, echo in June, 2012  . GERD (gastroesophageal reflux disease)   . History of bronchitis   . Hypoxia    Pneumonia, hospitalization, June, 2012  . Kidney stones 1990's  . MI (myocardial infarction) ([place]) 1995  . Pneumonia 2012  . S/P colectomy    Benign tumor    Past Surgical History:  Procedure Laterality Date  . CHEST TUBE INSERTION    . COLECTOMY  1981   benign mass  . COLONOSCOPY WITH PROPOFOL N/A 07/04/2014   Procedure: COLONOSCOPY WITH PROPOFOL;  Surgeon: Gatha Mayer, MD;  Location: [facility];  Service: Endoscopy;  Laterality: N/A;  . KNEE ARTHROSCOPY Left 07/19/2015   Procedure: LEFT ARTHROSCOPY KNEE WITH MEDIAL MENISCECTOMY;  Surgeon: Latanya Maudlin, MD;  Location: [facility];  Service: Orthopedics;  Laterality: Left;   LMA  . LEG SURGERY Left    cyst  . PLEURAL SCARIFICATION  1978  . SHOULDER OPEN ROTATOR CUFF REPAIR Left 05/26/2015   Procedure: LEFT OPEN ACROMINECTOMY AND REPAIR WITH GRAFT AND ANCHOR ROTATOR CUFF TEAR ;  Surgeon: Latanya Maudlin, MD;  Location: [facility];  Service: Orthopedics;  Laterality: Left;  Left shoulder block in holding  . TOTAL KNEE ARTHROPLASTY Left 01/29/2017   Procedure: LEFT TOTAL KNEE ARTHROPLASTY;  Surgeon: Latanya Maudlin, MD;  Location: [facility];  Service: Orthopedics;  Laterality: Left;  Adductor Block    There were no vitals filed for this visit.  Subjective Assessment - 02/26/17 0948    Subjective  Patient reported some sorenesss ongoing    Pertinent History  01/29/17 L TKA    Limitations  Walking;Standing    How long can you stand comfortably?  With walker 30 mins    How long can you walk comfortably?  Less than 10 minutes    Diagnostic tests  MRI, X-Ray    Patient Stated Goals  Gain motion.    Currently in Pain?  Yes    Pain Score  1     Pain Location  Knee    Pain Orientation  Left    Pain Descriptors / Indicators  Tightness;Sore    Pain Type  Surgical pain    Pain Onset  More than a month ago    Pain Frequency  Intermittent    Aggravating Factors   ROM in knee    Pain Relieving Factors  rest and ice         [facility] PT Assessment - 02/26/17 0001      AROM   AROM Assessment Site  Knee    Right/Left Knee  Left    Left Knee Extension  -8    Left Knee Flexion  90      PROM   PROM Assessment Site  Knee    Right/Left Knee  Left    Left Knee Extension  -5    Left Knee Flexion  103                  [facility] Adult PT Treatment/Exercise - 02/26/17 0001      Knee/Hip Exercises: Aerobic   Nustep  L5 x70min UE/LE       Knee/Hip Exercises: Standing   Rocker Board  2 minutes      Electrical Stimulation   Electrical Stimulation Location  Left knee   IFC x 15 mins 1-10 hz    Electrical Stimulation Goals  Edema;Pain      Vasopneumatic   Number  Minutes Vasopneumatic   15 minutes    Vasopnuematic Location   Knee    Vasopneumatic Pressure  Medium      Manual Therapy   Manual Therapy  Passive ROM;Soft tissue mobilization    Manual therapy comments  scar massage and patella mobs    Passive ROM  manual PROM into flexion and extension with gentle overpressure to improve ROM.                 PT Short Term Goals - 02/17/17 1144      PT SHORT TERM GOAL #1   Title  Patient will improve left knee flexion AROM to 85 degrees to improve functional mobility.    Time  2    Period  Weeks    Status  On-going      PT SHORT TERM GOAL #2   Title  Patient will improve left knee extension AROM to -5 degrees to improve functional mobility.    Time  2    Period  Weeks    Status  Achieved      PT SHORT TERM GOAL #3   Title  Patient will decrease pain at rest to less than or equal to 2/10.    Time  2    Period  Weeks    Status  On-going 1-3/10        PT Long Term Goals - 02/10/17 1022      PT LONG TERM GOAL #1   Title  Patient will improve Left Flexion knee AROM  to 110 degrees in order to perform functional tasks and ADLs.    Time  4    Period  Weeks    Status  On-going      PT LONG TERM GOAL #2   Title  Patient will improve left knee extension AROM to 0 degrees to normalize gait pattern and perform functional tasks and ADLs.    Time  4    Period  Weeks    Status  On-going      PT LONG TERM GOAL #3   Title  Patient will improve left knee strength to 5/5 for adequate knee stabilization to perform functional activities and ADLs.    Time  4  Period  Weeks    Status  On-going      PT LONG TERM GOAL #4   Title  Patient will ascend and descend steps with proper step over step gait pattern with UE support in order to safely enter and exit home..    Time  4    Period  Weeks    Status  On-going      PT LONG TERM GOAL #5   Title  Patient will ambulate with single point cane with </= 2/10 pain, normalized, step through  gait pattern in order to ambulate efficiently within community.    Time  4    Period  Weeks    Status  On-going            Plan - 02/26/17 1029    Clinical Impression Statement  Patient tolerated treatment well today. Patient reported doing HEP daily and feels progress overall. Patient doing well with ROM yet has limitations due to edema. Patient goals progressing toward goals. Normal response to removal of modalities.     Rehab Potential  Excellent    PT Frequency  3x / week    PT Duration  4 weeks    PT Treatment/Interventions  ADLs/Self Care Home Management;Cryotherapy;Electrical Stimulation;Moist Heat;Iontophoresis 4mg /ml Dexamethasone;Therapeutic exercise;Therapeutic activities;Patient/family education;Neuromuscular re-education;Balance training;Manual techniques;Passive range of motion;Vasopneumatic Device    PT Next Visit Plan  cont with TKA and progress patient as tolerated.    Consulted and Agree with Plan of Care  Patient       Patient will benefit from skilled therapeutic intervention in order to improve the following deficits and impairments:  Abnormal gait, Pain, Decreased mobility, Decreased range of motion, Decreased strength, Difficulty walking, Increased edema  Visit Diagnosis: Acute pain of left knee  Stiffness of left knee, not elsewhere classified  Difficulty in walking, not elsewhere classified     Problem List Patient Active Problem List   Diagnosis Date Noted  . Hx of total knee arthroplasty, right 01/29/2017  . Achilles tendinitis of right lower extremity 11/21/2016  . Dilated cardiomyopathy ([place]) 05/22/2016  . History of MI (myocardial infarction) 05/22/2016  . Benign prostatic hyperplasia with urinary frequency 05/16/2016  . Tear of rotator cuff 05/26/2015  . Shoulder injury 04/06/2015  . Rotator cuff syndrome 04/06/2015  . Special screening for malignant neoplasms, colon   . CAD (coronary artery disease)   . GERD (gastroesophageal reflux  disease)   . Dyslipidemia   . Ejection fraction < 50%   . Hypoxia     Montserrath Madding P, PTA 02/26/2017, 11:15 AM  [facility] [place], [place], [address] Phone: [phone]   Fax:  [phone]  Name: Shahin Knierim MRN: [id number] Date of Birth: 03-17-1950

## 2017-02-28 ENCOUNTER — Ambulatory Visit: Payer: Worker's Compensation | Admitting: Physical Therapy

## 2017-02-28 DIAGNOSIS — R262 Difficulty in walking, not elsewhere classified: Secondary | ICD-10-CM

## 2017-02-28 DIAGNOSIS — M25562 Pain in left knee: Secondary | ICD-10-CM

## 2017-02-28 DIAGNOSIS — M25662 Stiffness of left knee, not elsewhere classified: Secondary | ICD-10-CM

## 2017-02-28 NOTE — Therapy (Signed)
[facility] [place], [place], [address] Phone: [phone]   Fax:  [phone]  Physical Therapy Treatment  Patient Details  Name: Conall Vangorder MRN: [id number] Date of Birth: [birth year] Referring Provider: Dr. Gladstone Lighter   Encounter Date: 02/28/2017  PT End of Session - 02/28/17 0948    Visit Number  11    Number of Visits  12    Date for PT Re-Evaluation  03/05/17    Authorization Type  Worker's Compensation authorized 12 visits    Authorization - Visit Number  11    Authorization - Number of Visits  12    PT Start Time  0945    PT Stop Time  1044    PT Time Calculation (min)  59 min    Activity Tolerance  Patient tolerated treatment well    Behavior During Therapy  [facility] for tasks assessed/performed       Past Medical History:  Diagnosis Date  . Arthritis   . Atherosclerotic plaque   . Basal cell carcinoma   . CAD (coronary artery disease)    Anterior MI, [facility], 1995, no PCI, total LAD  / catheterization 2001, 30% LAD beyond the origin of a large diagonal, circumflex normal, RCA normal, anterolateral and apical  akinesis, ejection fraction 25-35% range  . Dyslipidemia   . Ejection fraction < 50%    EF 25-35%, catheterization 2001 / EF 20-25%, global hypokinesis, echo in June, 2012  . GERD (gastroesophageal reflux disease)   . History of bronchitis   . Hypoxia    Pneumonia, hospitalization, June, 2012  . Kidney stones 1990's  . MI (myocardial infarction) ([place]) 1995  . Pneumonia 2012  . S/P colectomy    Benign tumor    Past Surgical History:  Procedure Laterality Date  . CHEST TUBE INSERTION    . COLECTOMY  1981   benign mass  . COLONOSCOPY WITH PROPOFOL N/A 07/04/2014   Procedure: COLONOSCOPY WITH PROPOFOL;  Surgeon: Gatha Mayer, MD;  Location: [facility];  Service: Endoscopy;  Laterality: N/A;  . KNEE ARTHROSCOPY Left 07/19/2015   Procedure: LEFT ARTHROSCOPY KNEE WITH MEDIAL MENISCECTOMY;  Surgeon:  Latanya Maudlin, MD;  Location: [facility];  Service: Orthopedics;  Laterality: Left;  LMA  . LEG SURGERY Left    cyst  . PLEURAL SCARIFICATION  1978  . SHOULDER OPEN ROTATOR CUFF REPAIR Left 05/26/2015   Procedure: LEFT OPEN ACROMINECTOMY AND REPAIR WITH GRAFT AND ANCHOR ROTATOR CUFF TEAR ;  Surgeon: Latanya Maudlin, MD;  Location: [facility];  Service: Orthopedics;  Laterality: Left;  Left shoulder block in holding  . TOTAL KNEE ARTHROPLASTY Left 01/29/2017   Procedure: LEFT TOTAL KNEE ARTHROPLASTY;  Surgeon: Latanya Maudlin, MD;  Location: [facility];  Service: Orthopedics;  Laterality: Left;  Adductor Block    There were no vitals filed for this visit.  Subjective Assessment - 02/28/17 1028    Subjective  Patient reported soreness and increased swelling. He was on his feet a lot yesterday.    Patient is accompained by:  Family member    Pertinent History  01/29/17 L TKA    Limitations  Walking;Standing    How long can you stand comfortably?  With walker 30 mins    How long can you walk comfortably?  Less than 10 minutes    Diagnostic tests  MRI, X-Ray    Patient Stated Goals  Gain motion.    Currently in Pain?  Yes    Pain Score  2  Pain Location  Knee    Pain Orientation  Left    Pain Descriptors / Indicators  Sore;Tightness    Pain Type  Surgical pain    Pain Onset  More than a month ago                      [facility] Adult PT Treatment/Exercise - 02/28/17 0001      Exercises   Exercises  Knee/Hip      Knee/Hip Exercises: Stretches   Active Hamstring Stretch  Left;3 reps;30 seconds      Knee/Hip Exercises: Aerobic   Stationary Bike  x15 minutes seat 8; AAROM half moons to improve flexion      Knee/Hip Exercises: Standing   Rocker Board  4 minutes DF/PF and IN/EV      Modalities   Modalities  Psychologist, educational Location  L knee    Electrical Stimulation Action  IFC    Electrical Stimulation  Parameters  80-150 hz x15    Electrical Stimulation Goals  Edema;Pain      Vasopneumatic   Number Minutes Vasopneumatic   15 minutes    Vasopnuematic Location   Knee    Vasopneumatic Pressure  Medium      Manual Therapy   Manual Therapy  Passive ROM;Joint mobilization    Joint Mobilization  Patella mobs, lateral inferior/superior to improve flexion and extension    Passive ROM  manual PROM into flexion and extension with gentle overpressure to improve ROM.                 PT Short Term Goals - 02/17/17 1144      PT SHORT TERM GOAL #1   Title  Patient will improve left knee flexion AROM to 85 degrees to improve functional mobility.    Time  2    Period  Weeks    Status  On-going      PT SHORT TERM GOAL #2   Title  Patient will improve left knee extension AROM to -5 degrees to improve functional mobility.    Time  2    Period  Weeks    Status  Achieved      PT SHORT TERM GOAL #3   Title  Patient will decrease pain at rest to less than or equal to 2/10.    Time  2    Period  Weeks    Status  On-going 1-3/10        PT Long Term Goals - 02/10/17 1022      PT LONG TERM GOAL #1   Title  Patient will improve Left Flexion knee AROM  to 110 degrees in order to perform functional tasks and ADLs.    Time  4    Period  Weeks    Status  On-going      PT LONG TERM GOAL #2   Title  Patient will improve left knee extension AROM to 0 degrees to normalize gait pattern and perform functional tasks and ADLs.    Time  4    Period  Weeks    Status  On-going      PT LONG TERM GOAL #3   Title  Patient will improve left knee strength to 5/5 for adequate knee stabilization to perform functional activities and ADLs.    Time  4    Period  Weeks    Status  On-going  PT LONG TERM GOAL #4   Title  Patient will ascend and descend steps with proper step over step gait pattern with UE support in order to safely enter and exit home..    Time  4    Period  Weeks    Status   On-going      PT LONG TERM GOAL #5   Title  Patient will ambulate with single point cane with </= 2/10 pain, normalized, step through gait pattern in order to ambulate efficiently within community.    Time  4    Period  Weeks    Status  On-going            Plan - 02/28/17 1044    Clinical Impression Statement  Patient able to complete exercises despite soreness. Patient noted with increased sway during IN/EV rockerboard balance with intermittent UE support. Patient instructed to focus on edema control and AROM as HEP. Patient in agreement.  No adverse affects noted upon removal of modalities.    Clinical Presentation  Stable    Clinical Decision Making  Low    Rehab Potential  Excellent    PT Frequency  3x / week    PT Duration  4 weeks    PT Treatment/Interventions  ADLs/Self Care Home Management;Cryotherapy;Electrical Stimulation;Moist Heat;Iontophoresis 4mg /ml Dexamethasone;Therapeutic exercise;Therapeutic activities;Patient/family education;Neuromuscular re-education;Balance training;Manual techniques;Passive range of motion;Vasopneumatic Device    PT Next Visit Plan  FOTO next visit. Assess goals. Progress exercises per pt tolerance.    Consulted and Agree with Plan of Care  Patient       Patient will benefit from skilled therapeutic intervention in order to improve the following deficits and impairments:  Abnormal gait, Pain, Decreased mobility, Decreased range of motion, Decreased strength, Difficulty walking, Increased edema  Visit Diagnosis: Acute pain of left knee  Stiffness of left knee, not elsewhere classified  Difficulty in walking, not elsewhere classified     Problem List Patient Active Problem List   Diagnosis Date Noted  . Hx of total knee arthroplasty, right 01/29/2017  . Achilles tendinitis of right lower extremity 11/21/2016  . Dilated cardiomyopathy ([place]) 05/22/2016  . History of MI (myocardial infarction) 05/22/2016  . Benign prostatic hyperplasia  with urinary frequency 05/16/2016  . Tear of rotator cuff 05/26/2015  . Shoulder injury 04/06/2015  . Rotator cuff syndrome 04/06/2015  . Special screening for malignant neoplasms, colon   . CAD (coronary artery disease)   . GERD (gastroesophageal reflux disease)   . Dyslipidemia   . Ejection fraction < 50%   . Hypoxia    Gabriela Eves, PT, DPT 02/28/2017, 10:47 AM  [facility] [address] [place], [place], [address] Phone: [phone]   Fax:  [phone]  Name: Tagen Milby MRN: [id number] Date of Birth: August 07, 1950

## 2017-03-03 ENCOUNTER — Ambulatory Visit: Payer: Worker's Compensation | Admitting: Physical Therapy

## 2017-03-03 ENCOUNTER — Encounter: Payer: Self-pay | Admitting: Physical Therapy

## 2017-03-03 DIAGNOSIS — R262 Difficulty in walking, not elsewhere classified: Secondary | ICD-10-CM

## 2017-03-03 DIAGNOSIS — M25662 Stiffness of left knee, not elsewhere classified: Secondary | ICD-10-CM

## 2017-03-03 DIAGNOSIS — M25562 Pain in left knee: Secondary | ICD-10-CM

## 2017-03-03 NOTE — Therapy (Signed)
[facility] [place], [place], [address] Phone: [phone]   Fax:  [phone]  Physical Therapy Treatment  Patient Details  Name: Tony Patrick MRN: [id number] Date of Birth: May 31, 1950 Referring Provider: Dr. Gladstone Lighter   Encounter Date: 03/03/2017  PT End of Session - 03/03/17 1027    Visit Number  12    Number of Visits  12    Date for PT Re-Evaluation  03/05/17    Authorization Type  Worker's Compensation authorized 12 visits    Authorization - Visit Number  12    Authorization - Number of Visits  12    PT Start Time  [phone]    PT Stop Time  1043    PT Time Calculation (min)  57 min    Activity Tolerance  Patient tolerated treatment well    Behavior During Therapy  [facility] for tasks assessed/performed       Past Medical History:  Diagnosis Date  . Arthritis   . Atherosclerotic plaque   . Basal cell carcinoma   . CAD (coronary artery disease)    Anterior MI, [facility], 1995, no PCI, total LAD  / catheterization 2001, 30% LAD beyond the origin of a large diagonal, circumflex normal, RCA normal, anterolateral and apical  akinesis, ejection fraction 25-35% range  . Dyslipidemia   . Ejection fraction < 50%    EF 25-35%, catheterization 2001 / EF 20-25%, global hypokinesis, echo in June, 2012  . GERD (gastroesophageal reflux disease)   . History of bronchitis   . Hypoxia    Pneumonia, hospitalization, June, 2012  . Kidney stones 1990's  . MI (myocardial infarction) ([place]) 1995  . Pneumonia 2012  . S/P colectomy    Benign tumor    Past Surgical History:  Procedure Laterality Date  . CHEST TUBE INSERTION    . COLECTOMY  1981   benign mass  . COLONOSCOPY WITH PROPOFOL N/A 07/04/2014   Procedure: COLONOSCOPY WITH PROPOFOL;  Surgeon: Gatha Mayer, MD;  Location: [facility];  Service: Endoscopy;  Laterality: N/A;  . KNEE ARTHROSCOPY Left 07/19/2015   Procedure: LEFT ARTHROSCOPY KNEE WITH MEDIAL MENISCECTOMY;  Surgeon:  Latanya Maudlin, MD;  Location: [facility];  Service: Orthopedics;  Laterality: Left;  LMA  . LEG SURGERY Left    cyst  . PLEURAL SCARIFICATION  1978  . SHOULDER OPEN ROTATOR CUFF REPAIR Left 05/26/2015   Procedure: LEFT OPEN ACROMINECTOMY AND REPAIR WITH GRAFT AND ANCHOR ROTATOR CUFF TEAR ;  Surgeon: Latanya Maudlin, MD;  Location: [facility];  Service: Orthopedics;  Laterality: Left;  Left shoulder block in holding  . TOTAL KNEE ARTHROPLASTY Left 01/29/2017   Procedure: LEFT TOTAL KNEE ARTHROPLASTY;  Surgeon: Latanya Maudlin, MD;  Location: [facility];  Service: Orthopedics;  Laterality: Left;  Adductor Block    There were no vitals filed for this visit.  Subjective Assessment - 03/03/17 0951    Subjective  Patient arrived with some increased discomfort in knee today    Pertinent History  01/29/17 L TKA    Limitations  Walking;Standing    How long can you stand comfortably?  With walker 30 mins    How long can you walk comfortably?  Less than 10 minutes    Diagnostic tests  MRI, X-Ray    Patient Stated Goals  Gain motion.    Currently in Pain?  Yes    Pain Score  4     Pain Location  Knee    Pain Orientation  Left  Pain Descriptors / Indicators  Sore;Discomfort;Tightness    Pain Type  Surgical pain    Pain Onset  More than a month ago    Pain Frequency  Intermittent    Aggravating Factors   RIM in knee    Pain Relieving Factors  rest and ice         [facility] PT Assessment - 03/03/17 0001      AROM   AROM Assessment Site  Knee    Right/Left Knee  Left    Left Knee Extension  -7    Left Knee Flexion  88      PROM   PROM Assessment Site  Knee    Right/Left Knee  Left    Left Knee Extension  -4    Left Knee Flexion  100                  [facility] Adult PT Treatment/Exercise - 03/03/17 0001      Knee/Hip Exercises: Aerobic   Stationary Bike  x15 minutes seat 8; AAROM half moons to improve flexion      Knee/Hip Exercises: Standing   Rocker Board  3 minutes      Psychologist, counselling Location  L knee    Electrical Stimulation Action  IFC    Electrical Stimulation Parameters  80-150hz x22mn    Electrical Stimulation Goals  Edema;Pain      Vasopneumatic   Number Minutes Vasopneumatic   15 minutes    Vasopnuematic Location   Knee    Vasopneumatic Pressure  Medium      Manual Therapy   Manual Therapy  Passive ROM    Passive ROM  manual PROM into flexion and extension with gentle overpressure to improve ROM.                 PT Short Term Goals - 03/03/17 1028      PT SHORT TERM GOAL #1   Title  Patient will improve left knee flexion AROM to 85 degrees to improve functional mobility.    Time  2    Period  Weeks    Status  Achieved AROM 88 degrees 03/03/17      PT SHORT TERM GOAL #2   Title  Patient will improve left knee extension AROM to -5 degrees to improve functional mobility.    Time  2    Period  Weeks    Status  Achieved      PT SHORT TERM GOAL #3   Title  Patient will decrease pain at rest to less than or equal to 2/10.    Time  2    Period  Weeks    Status  On-going        PT Long Term Goals - 02/10/17 1022      PT LONG TERM GOAL #1   Title  Patient will improve Left Flexion knee AROM  to 110 degrees in order to perform functional tasks and ADLs.    Time  4    Period  Weeks    Status  On-going      PT LONG TERM GOAL #2   Title  Patient will improve left knee extension AROM to 0 degrees to normalize gait pattern and perform functional tasks and ADLs.    Time  4    Period  Weeks    Status  On-going      PT LONG TERM GOAL #3   Title  Patient will  improve left knee strength to 5/5 for adequate knee stabilization to perform functional activities and ADLs.    Time  4    Period  Weeks    Status  On-going      PT LONG TERM GOAL #4   Title  Patient will ascend and descend steps with proper step over step gait pattern with UE support in order to safely enter and exit home..    Time  4    Period   Weeks    Status  On-going      PT LONG TERM GOAL #5   Title  Patient will ambulate with single point cane with </= 2/10 pain, normalized, step through gait pattern in order to ambulate efficiently within community.    Time  4    Period  Weeks    Status  On-going            Plan - March 18, 2017 1028    Clinical Impression Statement  Patient tolerated treatment well today yet limited due to edema in knee. Patient is progressing well overall and doing self stretches and HEP daily. Patient has improved active flexion today and met STG #1 other LTG's ongoing due to edema and pain limitations. FOTO 34% (initial 75%) F/U with MD     Rehab Potential  Excellent    PT Frequency  3x / week    PT Duration  4 weeks    PT Treatment/Interventions  ADLs/Self Care Home Management;Cryotherapy;Electrical Stimulation;Moist Heat;Iontophoresis 5m/ml Dexamethasone;Therapeutic exercise;Therapeutic activities;Patient/family education;Neuromuscular re-education;Balance training;Manual techniques;Passive range of motion;Vasopneumatic Device    PT Next Visit Plan  Cont with POC per MD Gioffree    Consulted and Agree with Plan of Care  Patient       Patient will benefit from skilled therapeutic intervention in order to improve the following deficits and impairments:  Abnormal gait, Pain, Decreased mobility, Decreased range of motion, Decreased strength, Difficulty walking, Increased edema  Visit Diagnosis: Acute pain of left knee  Stiffness of left knee, not elsewhere classified  Difficulty in walking, not elsewhere classified   G-Codes - 0March 05, 20191035    Functional Assessment Tool Used (Outpatient Only)  FOTO 34% 12visit       Problem List Patient Active Problem List   Diagnosis Date Noted  . Hx of total knee arthroplasty, right 01/29/2017  . Achilles tendinitis of right lower extremity 11/21/2016  . Dilated cardiomyopathy ([place] 05/22/2016  . History of MI (myocardial infarction) 05/22/2016  .  Benign prostatic hyperplasia with urinary frequency 05/16/2016  . Tear of rotator cuff 05/26/2015  . Shoulder injury 04/06/2015  . Rotator cuff syndrome 04/06/2015  . Special screening for malignant neoplasms, colon   . CAD (coronary artery disease)   . GERD (gastroesophageal reflux disease)   . Dyslipidemia   . Ejection fraction < 50%   . Hypoxia     CLadean Raya PTA 02019-03-509:00 AM   [facility] [address] [address]Phone: [phone]  Fax:  [phone] Name: CAubrey VoongMRN: [id number] of Birth: 9April 04, 1952

## 2017-03-05 ENCOUNTER — Encounter: Payer: Self-pay | Admitting: Physical Therapy

## 2017-03-05 ENCOUNTER — Ambulatory Visit: Payer: Worker's Compensation | Admitting: Physical Therapy

## 2017-03-05 DIAGNOSIS — R262 Difficulty in walking, not elsewhere classified: Secondary | ICD-10-CM

## 2017-03-05 DIAGNOSIS — M25562 Pain in left knee: Secondary | ICD-10-CM

## 2017-03-05 DIAGNOSIS — M25662 Stiffness of left knee, not elsewhere classified: Secondary | ICD-10-CM

## 2017-03-05 NOTE — Therapy (Signed)
[facility] [place], [place], [address] Phone: [phone]   Fax:  [phone]  Physical Therapy Treatment  Patient Details  Name: Tony Patrick MRN: [id number] Date of Birth: 1950-08-13 Referring Provider: Dr. Gladstone Lighter   Encounter Date: 03/05/2017  PT End of Session - 03/05/17 1015    Visit Number  13    Date for PT Re-Evaluation  03/05/17    Authorization Type  Worker's Compensation authorized 12 visits    PT Start Time  [phone]    PT Stop Time  1043    PT Time Calculation (min)  57 min    Activity Tolerance  Patient tolerated treatment well    Behavior During Therapy  [facility] for tasks assessed/performed       Past Medical History:  Diagnosis Date  . Arthritis   . Atherosclerotic plaque   . Basal cell carcinoma   . CAD (coronary artery disease)    Anterior MI, [facility], 1995, no PCI, total LAD  / catheterization 2001, 30% LAD beyond the origin of a large diagonal, circumflex normal, RCA normal, anterolateral and apical  akinesis, ejection fraction 25-35% range  . Dyslipidemia   . Ejection fraction < 50%    EF 25-35%, catheterization 2001 / EF 20-25%, global hypokinesis, echo in June, 2012  . GERD (gastroesophageal reflux disease)   . History of bronchitis   . Hypoxia    Pneumonia, hospitalization, June, 2012  . Kidney stones 1990's  . MI (myocardial infarction) ([place]) 1995  . Pneumonia 2012  . S/P colectomy    Benign tumor    Past Surgical History:  Procedure Laterality Date  . CHEST TUBE INSERTION    . COLECTOMY  1981   benign mass  . COLONOSCOPY WITH PROPOFOL N/A 07/04/2014   Procedure: COLONOSCOPY WITH PROPOFOL;  Surgeon: Gatha Mayer, MD;  [facility];  Service: Endoscopy;  Laterality: N/A;  . KNEE ARTHROSCOPY Left 07/19/2015   Procedure: LEFT ARTHROSCOPY KNEE WITH MEDIAL MENISCECTOMY;  Surgeon: Latanya Maudlin, MD;  [facility]: [facility];  Service: Orthopedics;  Laterality: Left;  LMA  . LEG SURGERY Left     cyst  . PLEURAL SCARIFICATION  1978  . SHOULDER OPEN ROTATOR CUFF REPAIR Left 05/26/2015   Procedure: LEFT OPEN ACROMINECTOMY AND REPAIR WITH GRAFT AND ANCHOR ROTATOR CUFF TEAR ;  Surgeon: Latanya Maudlin, MD;  Location: [facility];  Service: Orthopedics;  Laterality: Left;  Left shoulder block in holding  . TOTAL KNEE ARTHROPLASTY Left 01/29/2017   Procedure: LEFT TOTAL KNEE ARTHROPLASTY;  Surgeon: Latanya Maudlin, MD;  [facility];  Service: Orthopedics;  Laterality: Left;  Adductor Block    There were no vitals filed for this visit.  Subjective Assessment - 03/05/17 0950    Subjective  Patient arrived and reported going to MD and to cont therapy 2 weeks then F/U with MD    Pertinent History  01/29/17 L TKA    Limitations  Walking;Standing    How long can you stand comfortably?  With walker 30 mins    How long can you walk comfortably?  Less than 10 minutes    Diagnostic tests  MRI, X-Ray    Patient Stated Goals  Gain motion.    Currently in Pain?  Yes    Pain Score  4     Pain Location  Knee    Pain Descriptors / Indicators  Tightness;Discomfort    Pain Type  Surgical pain    Pain Onset  More than a  month ago    Pain Frequency  Intermittent    Aggravating Factors   ROM in knee    Pain Relieving Factors  rest and ice         [facility] PT Assessment - 03/05/17 0001      AROM   AROM Assessment Site  Knee    Right/Left Knee  Left    Left Knee Extension  -7    Left Knee Flexion  92      PROM   PROM Assessment Site  Knee    Right/Left Knee  Left    Left Knee Extension  -4    Left Knee Flexion  108                  [facility] Adult PT Treatment/Exercise - 03/05/17 0001      Knee/Hip Exercises: Aerobic   Stationary Bike  x15 minutes seat 8 half range to full revolutions today      Knee/Hip Exercises: Machines for Strengthening   Cybex Knee Extension  10# x20 holds for strength and flexion stretch    Cybex Knee Flexion  30# x20      Electrical Stimulation    Electrical Stimulation Location  L knee    Electrical Stimulation Action  IFC    Electrical Stimulation Parameters  80-150hz  x47min    Electrical Stimulation Goals  Edema;Pain      Vasopneumatic   Number Minutes Vasopneumatic   15 minutes    Vasopnuematic Location   Knee    Vasopneumatic Pressure  Medium      Manual Therapy   Manual Therapy  Passive ROM    Passive ROM  manual PROM into flexion and extension with gentle overpressure to improve ROM.                 PT Short Term Goals - 03/03/17 1028      PT SHORT TERM GOAL #1   Title  Patient will improve left knee flexion AROM to 85 degrees to improve functional mobility.    Time  2    Period  Weeks    Status  Achieved AROM 88 degrees 03/03/17      PT SHORT TERM GOAL #2   Title  Patient will improve left knee extension AROM to -5 degrees to improve functional mobility.    Time  2    Period  Weeks    Status  Achieved      PT SHORT TERM GOAL #3   Title  Patient will decrease pain at rest to less than or equal to 2/10.    Time  2    Period  Weeks    Status  On-going        PT Long Term Goals - 02/10/17 1022      PT LONG TERM GOAL #1   Title  Patient will improve Left Flexion knee AROM  to 110 degrees in order to perform functional tasks and ADLs.    Time  4    Period  Weeks    Status  On-going      PT LONG TERM GOAL #2   Title  Patient will improve left knee extension AROM to 0 degrees to normalize gait pattern and perform functional tasks and ADLs.    Time  4    Period  Weeks    Status  On-going      PT LONG TERM GOAL #3   Title  Patient will improve left knee strength to  5/5 for adequate knee stabilization to perform functional activities and ADLs.    Time  4    Period  Weeks    Status  On-going      PT LONG TERM GOAL #4   Title  Patient will ascend and descend steps with proper step over step gait pattern with UE support in order to safely enter and exit home..    Time  4    Period  Weeks     Status  On-going      PT LONG TERM GOAL #5   Title  Patient will ambulate with single point cane with </= 2/10 pain, normalized, step through gait pattern in order to ambulate efficiently within community.    Time  4    Period  Weeks    Status  On-going            Plan - 03/05/17 1017    Clinical Impression Statement  Patient tolerated treatment well today. Patient able to make full revolution on bike today. Patient improved PROm for left knee flexion to 108 degrees. Patient has ongoing ext limitations yet doing self HEP daily. Patient progressing toward goals.     Rehab Potential  Excellent    PT Frequency  3x / week    PT Duration  4 weeks    PT Treatment/Interventions  ADLs/Self Care Home Management;Cryotherapy;Electrical Stimulation;Moist Heat;Iontophoresis 4mg /ml Dexamethasone;Therapeutic exercise;Therapeutic activities;Patient/family education;Neuromuscular re-education;Balance training;Manual techniques;Passive range of motion;Vasopneumatic Device    PT Next Visit Plan  Cont with POC waiting for order from MD Gioffree    Consulted and Agree with Plan of Care  Patient       Patient will benefit from skilled therapeutic intervention in order to improve the following deficits and impairments:  Abnormal gait, Pain, Decreased mobility, Decreased range of motion, Decreased strength, Difficulty walking, Increased edema  Visit Diagnosis: Acute pain of left knee  Stiffness of left knee, not elsewhere classified  Difficulty in walking, not elsewhere classified     Problem List Patient Active Problem List   Diagnosis Date Noted  . Hx of total knee arthroplasty, right 01/29/2017  . Achilles tendinitis of right lower extremity 11/21/2016  . Dilated cardiomyopathy ([place]) 05/22/2016  . History of MI (myocardial infarction) 05/22/2016  . Benign prostatic hyperplasia with urinary frequency 05/16/2016  . Tear of rotator cuff 05/26/2015  . Shoulder injury 04/06/2015  . Rotator cuff  syndrome 04/06/2015  . Special screening for malignant neoplasms, colon   . CAD (coronary artery disease)   . GERD (gastroesophageal reflux disease)   . Dyslipidemia   . Ejection fraction < 50%   . Hypoxia     DUNFORD, CHRISTINA P, PTA 03/05/2017, 10:44 AM  [facility] [place], [place], [address] Phone: [phone]   Fax:  [phone]  Name: Tony Patrick MRN: [id number] Date of Birth: 02-13-1950

## 2017-03-07 ENCOUNTER — Ambulatory Visit: Payer: Worker's Compensation | Admitting: Physical Therapy

## 2017-03-07 DIAGNOSIS — M25562 Pain in left knee: Secondary | ICD-10-CM

## 2017-03-07 DIAGNOSIS — M25662 Stiffness of left knee, not elsewhere classified: Secondary | ICD-10-CM

## 2017-03-07 DIAGNOSIS — R262 Difficulty in walking, not elsewhere classified: Secondary | ICD-10-CM

## 2017-03-07 NOTE — Therapy (Signed)
[facility] [place], [place], [address] Phone: [phone]   Fax:  [phone]  Physical Therapy Treatment  Patient Details  Name: Tony Patrick MRN: [id number] Date of Birth: 08-17-1950 Referring Provider: Dr. Gladstone Lighter   Encounter Date: 03/07/2017  PT End of Session - 03/07/17 0954    Visit Number  13    Number of Visits  24    Date for PT Re-Evaluation  03/05/17    Authorization Type  Worker's Compensation authorized 12 visits    Authorization - Visit Number  18    Authorization - Number of Visits  24    PT Start Time  0945    PT Stop Time  1046    PT Time Calculation (min)  61 min    Activity Tolerance  Patient tolerated treatment well    Behavior During Therapy  [facility] for tasks assessed/performed       Past Medical History:  Diagnosis Date  . Arthritis   . Atherosclerotic plaque   . Basal cell carcinoma   . CAD (coronary artery disease)    Anterior MI, [facility], 1995, no PCI, total LAD  / catheterization 2001, 30% LAD beyond the origin of a large diagonal, circumflex normal, RCA normal, anterolateral and apical  akinesis, ejection fraction 25-35% range  . Dyslipidemia   . Ejection fraction < 50%    EF 25-35%, catheterization 2001 / EF 20-25%, global hypokinesis, echo in June, 2012  . GERD (gastroesophageal reflux disease)   . History of bronchitis   . Hypoxia    Pneumonia, hospitalization, June, 2012  . Kidney stones 1990's  . MI (myocardial infarction) ([place]) 1995  . Pneumonia 2012  . S/P colectomy    Benign tumor    Past Surgical History:  Procedure Laterality Date  . CHEST TUBE INSERTION    . COLECTOMY  1981   benign mass  . COLONOSCOPY WITH PROPOFOL N/A 07/04/2014   Procedure: COLONOSCOPY WITH PROPOFOL;  Surgeon: Gatha Mayer, MD;  Location: [facility];  Service: Endoscopy;  Laterality: N/A;  . KNEE ARTHROSCOPY Left 07/19/2015   Procedure: LEFT ARTHROSCOPY KNEE WITH MEDIAL MENISCECTOMY;  Surgeon:  Latanya Maudlin, MD;  Location: [facility];  Service: Orthopedics;  Laterality: Left;  LMA  . LEG SURGERY Left    cyst  . PLEURAL SCARIFICATION  1978  . SHOULDER OPEN ROTATOR CUFF REPAIR Left 05/26/2015   Procedure: LEFT OPEN ACROMINECTOMY AND REPAIR WITH GRAFT AND ANCHOR ROTATOR CUFF TEAR ;  Surgeon: Latanya Maudlin, MD;  Location: [facility];  Service: Orthopedics;  Laterality: Left;  Left shoulder block in holding  . TOTAL KNEE ARTHROPLASTY Left 01/29/2017   Procedure: LEFT TOTAL KNEE ARTHROPLASTY;  Surgeon: Latanya Maudlin, MD;  Location: [facility];  Service: Orthopedics;  Laterality: Left;  Adductor Block    There were no vitals filed for this visit.  Subjective Assessment - 03/07/17 1224    Subjective  Patient still reports stiffness and soreness in L knee. MD follow up went well and MD pleased with progress.    Patient is accompained by:  Family member    Pertinent History  01/29/17 L TKA    Limitations  Walking;Standing    How long can you stand comfortably?  With walker 30 mins    How long can you walk comfortably?  Less than 10 minutes    Diagnostic tests  MRI, X-Ray    Patient Stated Goals  Gain motion.    Currently in Pain?  Yes  Pain Score  3     Pain Location  Knee    Pain Orientation  Left    Pain Descriptors / Indicators  Tightness    Pain Type  Surgical pain    Pain Onset  More than a month ago    Pain Frequency  Intermittent         [facility] PT Assessment - 03/07/17 0001      Assessment   Medical Diagnosis  Left total knee replacement    Next MD Visit  02/11/2017                  [facility] Adult PT Treatment/Exercise - 03/07/17 0001      Knee/Hip Exercises: Aerobic   Stationary Bike  x12 seat 8 half range to full backward revolutions      Knee/Hip Exercises: Standing   Terminal Knee Extension  Left;2 sets;10 reps    Forward Step Up  Left;Hand Hold: 2;Step Height: 4";2 sets;10 reps    Step Down  Left;15 reps;Hand Hold: 2;Step Height: 4"    Wall Squat  3 sets;10  reps      Electrical Stimulation   Electrical Stimulation Location  L knee    Electrical Stimulation Action  IFC    Electrical Stimulation Parameters  80-150 x10    Electrical Stimulation Goals  Pain;Edema      Vasopneumatic   Number Minutes Vasopneumatic   10 minutes    Vasopnuematic Location   Knee    Vasopneumatic Pressure  Medium      Manual Therapy   Manual Therapy  Passive ROM;Joint mobilization;Edema management    Edema Management  Edema massage to reduce swelling    Joint Mobilization  Patella mobs, lateral inferior/superior to improve flexion and extension    Passive ROM  manual PROM into flexion and extension with gentle overpressure to improve ROM.                 PT Short Term Goals - 03/03/17 1028      PT SHORT TERM GOAL #1   Title  Patient will improve left knee flexion AROM to 85 degrees to improve functional mobility.    Time  2    Period  Weeks    Status  Achieved AROM 88 degrees 03/03/17      PT SHORT TERM GOAL #2   Title  Patient will improve left knee extension AROM to -5 degrees to improve functional mobility.    Time  2    Period  Weeks    Status  Achieved      PT SHORT TERM GOAL #3   Title  Patient will decrease pain at rest to less than or equal to 2/10.    Time  2    Period  Weeks    Status  On-going        PT Long Term Goals - 02/10/17 1022      PT LONG TERM GOAL #1   Title  Patient will improve Left Flexion knee AROM  to 110 degrees in order to perform functional tasks and ADLs.    Time  4    Period  Weeks    Status  On-going      PT LONG TERM GOAL #2   Title  Patient will improve left knee extension AROM to 0 degrees to normalize gait pattern and perform functional tasks and ADLs.    Time  4    Period  Weeks    Status  On-going      PT LONG TERM GOAL #3   Title  Patient will improve left knee strength to 5/5 for adequate knee stabilization to perform functional activities and ADLs.    Time  4    Period  Weeks    Status   On-going      PT LONG TERM GOAL #4   Title  Patient will ascend and descend steps with proper step over step gait pattern with UE support in order to safely enter and exit home..    Time  4    Period  Weeks    Status  On-going      PT LONG TERM GOAL #5   Title  Patient will ambulate with single point cane with </= 2/10 pain, normalized, step through gait pattern in order to ambulate efficiently within community.    Time  4    Period  Weeks    Status  On-going            Plan - 03/07/17 1226    Clinical Impression Statement  Patient was able to tolerate treatment well today. Patient able to make full backward revolution multiple times without hip hiking. Patient still limited with L quad strength as noted by poor control of decent during lateral step downs. Patient and daughter educated how to perform edema massage to address swelling in knee. Patient and daughter reported understanding. No adverse affects noted upon removal of modalities.     Clinical Presentation  Stable    Clinical Decision Making  Low    Rehab Potential  Good    PT Frequency  2x / week    PT Duration  4 weeks    PT Treatment/Interventions  ADLs/Self Care Home Management;Cryotherapy;Electrical Stimulation;Moist Heat;Iontophoresis 4mg /ml Dexamethasone;Therapeutic exercise;Therapeutic activities;Patient/family education;Neuromuscular re-education;Balance training;Manual techniques;Passive range of motion;Vasopneumatic Device    PT Next Visit Plan  Continue POC for ROM and stength    Consulted and Agree with Plan of Care  Patient       Patient will benefit from skilled therapeutic intervention in order to improve the following deficits and impairments:  Abnormal gait, Pain, Decreased mobility, Decreased range of motion, Decreased strength, Difficulty walking, Increased edema  Visit Diagnosis: Acute pain of left knee  Stiffness of left knee, not elsewhere classified  Difficulty in walking, not elsewhere  classified     Problem List Patient Active Problem List   Diagnosis Date Noted  . Hx of total knee arthroplasty, right 01/29/2017  . Achilles tendinitis of right lower extremity 11/21/2016  . Dilated cardiomyopathy ([place]) 05/22/2016  . History of MI (myocardial infarction) 05/22/2016  . Benign prostatic hyperplasia with urinary frequency 05/16/2016  . Tear of rotator cuff 05/26/2015  . Shoulder injury 04/06/2015  . Rotator cuff syndrome 04/06/2015  . Special screening for malignant neoplasms, colon   . CAD (coronary artery disease)   . GERD (gastroesophageal reflux disease)   . Dyslipidemia   . Ejection fraction < 50%   . Hypoxia    Gabriela Eves, PT, DPT 03/07/2017, 12:42 PM  [facility] [address] [place], [place], [address] Phone: [phone]   Fax:  [phone]  Name: Tony Patrick MRN: [id number] Date of Birth: 1950/10/03

## 2017-03-10 ENCOUNTER — Ambulatory Visit: Payer: Worker's Compensation | Admitting: Physical Therapy

## 2017-03-10 ENCOUNTER — Encounter: Payer: Self-pay | Admitting: Physical Therapy

## 2017-03-10 DIAGNOSIS — R262 Difficulty in walking, not elsewhere classified: Secondary | ICD-10-CM

## 2017-03-10 DIAGNOSIS — M25562 Pain in left knee: Secondary | ICD-10-CM

## 2017-03-10 DIAGNOSIS — M25662 Stiffness of left knee, not elsewhere classified: Secondary | ICD-10-CM

## 2017-03-10 NOTE — Therapy (Signed)
[facility] [place], [place], [address] Phone: [phone]   Fax:  [phone]  Physical Therapy Treatment  Patient Details  Name: Tony Patrick MRN: [id number] Date of Birth: 1950/08/10 Referring Provider: Dr. Gladstone Lighter   Encounter Date: 03/10/2017  PT End of Session - 03/10/17 1341    Visit Number  14    Number of Visits  24    Date for PT Re-Evaluation  03/05/17    PT Start Time  1301    PT Stop Time  1357    PT Time Calculation (min)  56 min    Activity Tolerance  Patient tolerated treatment well    Behavior During Therapy  [facility] for tasks assessed/performed       Past Medical History:  Diagnosis Date  . Arthritis   . Atherosclerotic plaque   . Basal cell carcinoma   . CAD (coronary artery disease)    Anterior MI, [facility], 1995, no PCI, total LAD  / catheterization 2001, 30% LAD beyond the origin of a large diagonal, circumflex normal, RCA normal, anterolateral and apical  akinesis, ejection fraction 25-35% range  . Dyslipidemia   . Ejection fraction < 50%    EF 25-35%, catheterization 2001 / EF 20-25%, global hypokinesis, echo in June, 2012  . GERD (gastroesophageal reflux disease)   . History of bronchitis   . Hypoxia    Pneumonia, hospitalization, June, 2012  . Kidney stones 1990's  . MI (myocardial infarction) ([place]) 1995  . Pneumonia 2012  . S/P colectomy    Benign tumor    Past Surgical History:  Procedure Laterality Date  . CHEST TUBE INSERTION    . COLECTOMY  1981   benign mass  . COLONOSCOPY WITH PROPOFOL N/A 07/04/2014   Procedure: COLONOSCOPY WITH PROPOFOL;  Surgeon: Gatha Mayer, MD;  [facility]: [facility];  Service: Endoscopy;  Laterality: N/A;  . KNEE ARTHROSCOPY Left 07/19/2015   Procedure: LEFT ARTHROSCOPY KNEE WITH MEDIAL MENISCECTOMY;  Surgeon: Latanya Maudlin, MD;  Location: [facility];  Service: Orthopedics;  Laterality: Left;  LMA  . LEG SURGERY Left    cyst  . PLEURAL SCARIFICATION  1978   . SHOULDER OPEN ROTATOR CUFF REPAIR Left 05/26/2015   Procedure: LEFT OPEN ACROMINECTOMY AND REPAIR WITH GRAFT AND ANCHOR ROTATOR CUFF TEAR ;  Surgeon: Latanya Maudlin, MD;  Location: [facility];  Service: Orthopedics;  Laterality: Left;  Left shoulder block in holding  . TOTAL KNEE ARTHROPLASTY Left 01/29/2017   Procedure: LEFT TOTAL KNEE ARTHROPLASTY;  Surgeon: Latanya Maudlin, MD;  [facility]: [facility];  Service: Orthopedics;  Laterality: Left;  Adductor Block    There were no vitals filed for this visit.  Subjective Assessment - 03/10/17 1307    Subjective  Patient reported ongoing stiffness in knee.    Patient is accompained by:  Family member    Pertinent History  01/29/17 L TKA    Limitations  Walking;Standing    How long can you stand comfortably?  With walker 30 mins    How long can you walk comfortably?  Less than 10 minutes    Diagnostic tests  MRI, X-Ray    Patient Stated Goals  Gain motion.    Currently in Pain?  Yes    Pain Score  3     Pain Location  Knee    Pain Orientation  Left    Pain Descriptors / Indicators  Tightness    Pain Type  Surgical pain    Pain Onset  More than a month ago    Pain Frequency  Intermittent    Aggravating Factors   ROM    Pain Relieving Factors  rest and ice         [facility] PT Assessment - 03/10/17 0001      AROM   AROM Assessment Site  Knee    Right/Left Knee  Left    Left Knee Extension  -8    Left Knee Flexion  93      PROM   PROM Assessment Site  Knee    Right/Left Knee  Left    Left Knee Extension  -5    Left Knee Flexion  100                  [facility] Adult PT Treatment/Exercise - 03/10/17 0001      Knee/Hip Exercises: Aerobic   Stationary Bike  x15 seat 8- half range to full backward revolutions      Knee/Hip Exercises: Standing   Rocker Board  3 minutes      Knee/Hip Exercises: Supine   Bridges with Ball Squeeze  20 reps    Other Supine Knee/Hip Exercises  supine wall slides x 17min      Knee/Hip Exercises:  Sidelying   Hip ABduction  Strengthening;Left;20 reps      Acupuncturist Location  L knee    Electrical Stimulation Action  IFC    Electrical Stimulation Parameters  80-150hz  x49min    Electrical Stimulation Goals  Pain;Edema      Vasopneumatic   Number Minutes Vasopneumatic   15 minutes    Vasopnuematic Location   Knee    Vasopneumatic Pressure  Medium      Manual Therapy   Manual Therapy  Passive ROM;Soft tissue mobilization;Joint mobilization    Edema Management  Edema massage to reduce swelling    Joint Mobilization  Patella mobs, lateral inferior/superior to improve flexion and extension    Passive ROM  manual PROM into flexion and extension with gentle overpressure to improve ROM.                 PT Short Term Goals - 03/03/17 1028      PT SHORT TERM GOAL #1   Title  Patient will improve left knee flexion AROM to 85 degrees to improve functional mobility.    Time  2    Period  Weeks    Status  Achieved AROM 88 degrees 03/03/17      PT SHORT TERM GOAL #2   Title  Patient will improve left knee extension AROM to -5 degrees to improve functional mobility.    Time  2    Period  Weeks    Status  Achieved      PT SHORT TERM GOAL #3   Title  Patient will decrease pain at rest to less than or equal to 2/10.    Time  2    Period  Weeks    Status  On-going        PT Long Term Goals - 02/10/17 1022      PT LONG TERM GOAL #1   Title  Patient will improve Left Flexion knee AROM  to 110 degrees in order to perform functional tasks and ADLs.    Time  4    Period  Weeks    Status  On-going      PT LONG TERM GOAL #2   Title  Patient will improve  left knee extension AROM to 0 degrees to normalize gait pattern and perform functional tasks and ADLs.    Time  4    Period  Weeks    Status  On-going      PT LONG TERM GOAL #3   Title  Patient will improve left knee strength to 5/5 for adequate knee stabilization to perform functional  activities and ADLs.    Time  4    Period  Weeks    Status  On-going      PT LONG TERM GOAL #4   Title  Patient will ascend and descend steps with proper step over step gait pattern with UE support in order to safely enter and exit home..    Time  4    Period  Weeks    Status  On-going      PT LONG TERM GOAL #5   Title  Patient will ambulate with single point cane with </= 2/10 pain, normalized, step through gait pattern in order to ambulate efficiently within community.    Time  4    Period  Weeks    Status  On-going            Plan - 03/10/17 1339    Clinical Impression Statement  Patient tolerated treatment well today. Patient has ongoing tightness in left knee esp with flexion. Patient progressing with left knee strengthening exercises and has ongoing weakness in knee and hip. Patient doing HEP/daily stretches, patient has ongoing limitations from edema. Goals ongoing at this time.     Rehab Potential  Good    PT Frequency  2x / week    PT Duration  4 weeks    PT Treatment/Interventions  ADLs/Self Care Home Management;Cryotherapy;Electrical Stimulation;Moist Heat;Iontophoresis 4mg /ml Dexamethasone;Therapeutic exercise;Therapeutic activities;Patient/family education;Neuromuscular re-education;Balance training;Manual techniques;Passive range of motion;Vasopneumatic Device    PT Next Visit Plan  Continue POC for ROM and stength    Consulted and Agree with Plan of Care  Patient       Patient will benefit from skilled therapeutic intervention in order to improve the following deficits and impairments:  Abnormal gait, Pain, Decreased mobility, Decreased range of motion, Decreased strength, Difficulty walking, Increased edema  Visit Diagnosis: Acute pain of left knee  Stiffness of left knee, not elsewhere classified  Difficulty in walking, not elsewhere classified     Problem List Patient Active Problem List   Diagnosis Date Noted  . Hx of total knee arthroplasty, right  01/29/2017  . Achilles tendinitis of right lower extremity 11/21/2016  . Dilated cardiomyopathy ([place]) 05/22/2016  . History of MI (myocardial infarction) 05/22/2016  . Benign prostatic hyperplasia with urinary frequency 05/16/2016  . Tear of rotator cuff 05/26/2015  . Shoulder injury 04/06/2015  . Rotator cuff syndrome 04/06/2015  . Special screening for malignant neoplasms, colon   . CAD (coronary artery disease)   . GERD (gastroesophageal reflux disease)   . Dyslipidemia   . Ejection fraction < 50%   . Hypoxia     Kathryn Cosby P, PTA 03/10/2017, 1:57 PM  [facility] [place], [place], [address] Phone: [phone]   Fax:  [phone]  Name: Tony Patrick MRN: [id number] Date of Birth: 01/04/1951

## 2017-03-12 ENCOUNTER — Encounter: Payer: Self-pay | Admitting: Physical Therapy

## 2017-03-12 ENCOUNTER — Ambulatory Visit: Payer: Worker's Compensation | Admitting: Physical Therapy

## 2017-03-12 DIAGNOSIS — R262 Difficulty in walking, not elsewhere classified: Secondary | ICD-10-CM

## 2017-03-12 DIAGNOSIS — M25662 Stiffness of left knee, not elsewhere classified: Secondary | ICD-10-CM

## 2017-03-12 DIAGNOSIS — M25562 Pain in left knee: Secondary | ICD-10-CM

## 2017-03-12 NOTE — Therapy (Signed)
[facility] [place], [place], [address] Phone: [phone]   Fax:  [phone]  Physical Therapy Treatment  Patient Details  Name: Tony Patrick MRN: [id number] Date of Birth: 1950/08/30 Referring Provider: Dr. Gladstone Lighter   Encounter Date: 03/12/2017  PT End of Session - 03/12/17 1028    Visit Number  15    Number of Visits  24    Date for PT Re-Evaluation  03/05/17    Authorization Type  Worker's Compensation authorized 12 visits    PT Start Time  [phone]    PT Stop Time  1044    PT Time Calculation (min)  57 min    Activity Tolerance  Patient tolerated treatment well    Behavior During Therapy  [facility] for tasks assessed/performed       Past Medical History:  Diagnosis Date  . Arthritis   . Atherosclerotic plaque   . Basal cell carcinoma   . CAD (coronary artery disease)    Anterior MI, [facility], 1995, no PCI, total LAD  / catheterization 2001, 30% LAD beyond the origin of a large diagonal, circumflex normal, RCA normal, anterolateral and apical  akinesis, ejection fraction 25-35% range  . Dyslipidemia   . Ejection fraction < 50%    EF 25-35%, catheterization 2001 / EF 20-25%, global hypokinesis, echo in June, 2012  . GERD (gastroesophageal reflux disease)   . History of bronchitis   . Hypoxia    Pneumonia, hospitalization, June, 2012  . Kidney stones 1990's  . MI (myocardial infarction) ([place]) 1995  . Pneumonia 2012  . S/P colectomy    Benign tumor    Past Surgical History:  Procedure Laterality Date  . CHEST TUBE INSERTION    . COLECTOMY  1981   benign mass  . COLONOSCOPY WITH PROPOFOL N/A 07/04/2014   Procedure: COLONOSCOPY WITH PROPOFOL;  Surgeon: Gatha Mayer, MD;  Location: [facility];  Service: Endoscopy;  Laterality: N/A;  . KNEE ARTHROSCOPY Left 07/19/2015   Procedure: LEFT ARTHROSCOPY KNEE WITH MEDIAL MENISCECTOMY;  Surgeon: Latanya Maudlin, MD;  Location: [facility];  Service: Orthopedics;  Laterality: Left;   LMA  . LEG SURGERY Left    cyst  . PLEURAL SCARIFICATION  1978  . SHOULDER OPEN ROTATOR CUFF REPAIR Left 05/26/2015   Procedure: LEFT OPEN ACROMINECTOMY AND REPAIR WITH GRAFT AND ANCHOR ROTATOR CUFF TEAR ;  Surgeon: Latanya Maudlin, MD;  Location: [facility];  Service: Orthopedics;  Laterality: Left;  Left shoulder block in holding  . TOTAL KNEE ARTHROPLASTY Left 01/29/2017   Procedure: LEFT TOTAL KNEE ARTHROPLASTY;  Surgeon: Latanya Maudlin, MD;  Location: [facility];  Service: Orthopedics;  Laterality: Left;  Adductor Block    There were no vitals filed for this visit.  Subjective Assessment - 03/12/17 0957    Subjective  Patient reported ongoing stiffness in knee.    Pertinent History  01/29/17 L TKA    Limitations  Walking;Standing    How long can you stand comfortably?  With walker 30 mins    How long can you walk comfortably?  Less than 10 minutes    Diagnostic tests  MRI, X-Ray    Patient Stated Goals  Gain motion.    Currently in Pain?  Yes    Pain Score  3     Pain Location  Knee    Pain Orientation  Left    Pain Descriptors / Indicators  Tightness    Pain Type  Surgical pain    Pain Onset  More than a month ago    Pain Frequency  Intermittent    Aggravating Factors   ROM in knee    Pain Relieving Factors  rest and ice         [facility] PT Assessment - 03/12/17 0001      AROM   AROM Assessment Site  Knee    Right/Left Knee  Left    Left Knee Extension  -7    Left Knee Flexion  95      PROM   PROM Assessment Site  Knee    Right/Left Knee  Left    Left Knee Extension  -4    Left Knee Flexion  106                  [facility] Adult PT Treatment/Exercise - 03/12/17 0001      Knee/Hip Exercises: Aerobic   Stationary Bike  x10 seat 8 able to make 20 reps around       Knee/Hip Exercises: Machines for Strengthening   Cybex Knee Extension  10# x30 holds for strength and flexion stretch    Cybex Knee Flexion  30# x30      Knee/Hip Exercises: Standing   Rocker Board  3  minutes      Electrical Stimulation   Electrical Stimulation Location  L knee    Electrical Stimulation Action  IFC    Electrical Stimulation Parameters  80-150hz  x59min    Electrical Stimulation Goals  Pain;Edema      Vasopneumatic   Number Minutes Vasopneumatic   15 minutes    Vasopnuematic Location   Knee    Vasopneumatic Pressure  Medium      Manual Therapy   Manual Therapy  Passive ROM    Passive ROM  manual PROM into flexion and extension with gentle overpressure to improve ROM.                 PT Short Term Goals - 03/03/17 1028      PT SHORT TERM GOAL #1   Title  Patient will improve left knee flexion AROM to 85 degrees to improve functional mobility.    Time  2    Period  Weeks    Status  Achieved AROM 88 degrees 03/03/17      PT SHORT TERM GOAL #2   Title  Patient will improve left knee extension AROM to -5 degrees to improve functional mobility.    Time  2    Period  Weeks    Status  Achieved      PT SHORT TERM GOAL #3   Title  Patient will decrease pain at rest to less than or equal to 2/10.    Time  2    Period  Weeks    Status  On-going        PT Long Term Goals - 02/10/17 1022      PT LONG TERM GOAL #1   Title  Patient will improve Left Flexion knee AROM  to 110 degrees in order to perform functional tasks and ADLs.    Time  4    Period  Weeks    Status  On-going      PT LONG TERM GOAL #2   Title  Patient will improve left knee extension AROM to 0 degrees to normalize gait pattern and perform functional tasks and ADLs.    Time  4    Period  Weeks    Status  On-going  PT LONG TERM GOAL #3   Title  Patient will improve left knee strength to 5/5 for adequate knee stabilization to perform functional activities and ADLs.    Time  4    Period  Weeks    Status  On-going      PT LONG TERM GOAL #4   Title  Patient will ascend and descend steps with proper step over step gait pattern with UE support in order to safely enter and exit  home..    Time  4    Period  Weeks    Status  On-going      PT LONG TERM GOAL #5   Title  Patient will ambulate with single point cane with </= 2/10 pain, normalized, step through gait pattern in order to ambulate efficiently within community.    Time  4    Period  Weeks    Status  On-going            Plan - 03/12/17 1029    Clinical Impression Statement  Patient tolerated treatment well today. Patient able to progress with left knee strengthening exercises and continues to progress with left knee ROM with limitations due to ongoing edema. Patient continues to perform self stretches daily. Goals ongoing at this time.     Rehab Potential  Good    PT Frequency  2x / week    PT Duration  4 weeks    PT Treatment/Interventions  ADLs/Self Care Home Management;Cryotherapy;Electrical Stimulation;Moist Heat;Iontophoresis 4mg /ml Dexamethasone;Therapeutic exercise;Therapeutic activities;Patient/family education;Neuromuscular re-education;Balance training;Manual techniques;Passive range of motion;Vasopneumatic Device    PT Next Visit Plan  Continue POC for ROM and stength    Consulted and Agree with Plan of Care  Patient       Patient will benefit from skilled therapeutic intervention in order to improve the following deficits and impairments:  Abnormal gait, Pain, Decreased mobility, Decreased range of motion, Decreased strength, Difficulty walking, Increased edema  Visit Diagnosis: Acute pain of left knee  Stiffness of left knee, not elsewhere classified  Difficulty in walking, not elsewhere classified     Problem List Patient Active Problem List   Diagnosis Date Noted  . Hx of total knee arthroplasty, right 01/29/2017  . Achilles tendinitis of right lower extremity 11/21/2016  . Dilated cardiomyopathy ([place]) 05/22/2016  . History of MI (myocardial infarction) 05/22/2016  . Benign prostatic hyperplasia with urinary frequency 05/16/2016  . Tear of rotator cuff 05/26/2015  .  Shoulder injury 04/06/2015  . Rotator cuff syndrome 04/06/2015  . Special screening for malignant neoplasms, colon   . CAD (coronary artery disease)   . GERD (gastroesophageal reflux disease)   . Dyslipidemia   . Ejection fraction < 50%   . Hypoxia     Gavyn Ybarra P, PTA 03/12/2017, 10:46 AM  [facility] [place], [place], [address] Phone: [phone]   Fax:  [phone]  Name: Tony Patrick MRN: [id number] Date of Birth: 1950-12-25

## 2017-03-13 ENCOUNTER — Ambulatory Visit (INDEPENDENT_AMBULATORY_CARE_PROVIDER_SITE_OTHER): Payer: Medicare Other | Admitting: Physician Assistant

## 2017-03-13 ENCOUNTER — Encounter: Payer: Self-pay | Admitting: Physician Assistant

## 2017-03-13 VITALS — BP 130/79 | HR 75 | Temp 98.3°F | Ht 69.0 in | Wt 247.8 lb

## 2017-03-13 DIAGNOSIS — M75102 Unspecified rotator cuff tear or rupture of left shoulder, not specified as traumatic: Secondary | ICD-10-CM | POA: Diagnosis not present

## 2017-03-13 DIAGNOSIS — Z96651 Presence of right artificial knee joint: Secondary | ICD-10-CM

## 2017-03-13 DIAGNOSIS — J40 Bronchitis, not specified as acute or chronic: Secondary | ICD-10-CM | POA: Diagnosis not present

## 2017-03-13 MED ORDER — HYDROCODONE-HOMATROPINE 5-1.5 MG/5ML PO SYRP: 5.0000 mL | ORAL_SOLUTION | Freq: Four times a day (QID) | ORAL | 0 refills | Status: DC | PRN

## 2017-03-13 MED ORDER — AZITHROMYCIN 250 MG PO TABS: ORAL_TABLET | ORAL | 0 refills | Status: DC

## 2017-03-13 MED ORDER — DICLOFENAC SODIUM 1 % TD GEL: 1.0000 "application " | Freq: Three times a day (TID) | TRANSDERMAL | 3 refills | Status: DC | PRN

## 2017-03-13 MED ORDER — TAMSULOSIN HCL 0.4 MG PO CAPS: 0.4000 mg | ORAL_CAPSULE | Freq: Every evening | ORAL | 3 refills | Status: DC

## 2017-03-13 NOTE — Patient Instructions (Signed)
In a few days you may receive a survey in the mail or online from [employer] regarding your visit with us today. Please take a moment to fill this out. Your feedback is very important to our whole office. It can help us better understand your needs as well as improve your experience and satisfaction. Thank you for taking your time to complete it. We care about you.  Yarixa Lightcap, PA-C  

## 2017-03-13 NOTE — Progress Notes (Signed)
BP 130/79   Pulse 75   Temp 98.3 F (36.8 C) (Oral)   Ht 5\' 9"  (1.753 m)   Wt 247 lb 12.8 oz (112.4 kg)   BMI 36.59 kg/m    Subjective:    Patient ID: Tony Patrick, male    DOB: 1950/07/08, 67 y.o.   MRN: [id number]  HPI: Tony Patrick is a 67 y.o. male presenting on 03/13/2017 for Follow-up (3 month )  This patient comes in for periodic recheck on medications and conditions including OA of joint, CAD, bronchitis.  Patient with several days of progressing upper respiratory and bronchial symptoms. Initially there was more upper respiratory congestion. This progressed to having significant cough that is productive throughout the day and severe at night. There is occasional wheezing after coughing. Sometimes there is slight dyspnea on exertion. It is productive mucus that is yellow in color. Denies any blood.   All medications are reviewed today. There are no reports of any problems with the medications. All of the medical conditions are reviewed and updated.  Lab work is reviewed and will be ordered as medically necessary. There are no new problems reported with today's visit.   Past Medical History:  Diagnosis Date  . Arthritis   . Atherosclerotic plaque   . Basal cell carcinoma   . CAD (coronary artery disease)    Anterior MI, [facility], 1995, no PCI, total LAD  / catheterization 2001, 30% LAD beyond the origin of a large diagonal, circumflex normal, RCA normal, anterolateral and apical  akinesis, ejection fraction 25-35% range  . Dyslipidemia   . Ejection fraction < 50%    EF 25-35%, catheterization 2001 / EF 20-25%, global hypokinesis, echo in June, 2012  . GERD (gastroesophageal reflux disease)   . History of bronchitis   . Hypoxia    Pneumonia, hospitalization, June, 2012  . kidney stones   . MI (myocardial infarction) ([place]) 1995  . Pneumonia 2012  . S/P colectomy    Benign tumor   Relevant past medical, surgical, family and social history reviewed and updated as  indicated. Interim medical history since our last visit reviewed. Allergies and medications reviewed and updated. DATA REVIEWED: CHART IN EPIC  Family History reviewed for pertinent findings.  Review of Systems  Constitutional: Positive for fatigue. Negative for appetite change.  HENT: Positive for sinus pressure and sore throat.   Eyes: Negative.  Negative for pain and visual disturbance.  Respiratory: Positive for shortness of breath and wheezing. Negative for cough and chest tightness.   Cardiovascular: Negative.  Negative for chest pain, palpitations and leg swelling.  Gastrointestinal: Negative.  Negative for abdominal pain, diarrhea, nausea and vomiting.  Endocrine: Negative.   Genitourinary: Negative.   Musculoskeletal: Positive for back pain and myalgias.  Skin: Negative.  Negative for color change and rash.  Neurological: Positive for headaches. Negative for weakness and numbness.  Psychiatric/Behavioral: Negative.     Allergies as of 03/13/2017      Reactions   Ivp Dye [iodinated Diagnostic Agents] Swelling, Other (See Comments)   Arrest   Morphine And Related Nausea And Vomiting      Medication List        Accurate as of 03/13/17  9:16 AM. Always use your most recent med list.          albuterol 108 (90 Base) MCG/ACT inhaler Commonly known as:  PROVENTIL HFA;VENTOLIN HFA Inhale 1-2 puffs into the lungs every 6 (six) hours as needed for wheezing.   aspirin EC 325  MG tablet Take 1 tablet (325 mg total) by mouth 2 (two) times daily.   azithromycin 250 MG tablet Commonly known as:  ZITHROMAX Z-PAK Take as directed   carvedilol 25 MG tablet Commonly known as:  COREG TAKE 1 TABLET TWICE A DAY   cetirizine 10 MG tablet Commonly known as:  ZYRTEC TAKE 1 TABLET DAILY   diclofenac sodium 1 % Gel Commonly known as:  VOLTAREN Apply 1 application topically 3 (three) times daily as needed (for hands).   enalapril 20 MG tablet Commonly known as:  VASOTEC TAKE  ONE AND ONE-HALF TABLETS TWICE A DAY   finasteride 5 MG tablet Commonly known as:  PROSCAR Take 5 mg by mouth daily.   fluticasone 50 MCG/ACT nasal spray Commonly known as:  FLONASE Place 2 sprays into both nostrils daily.   HYDROcodone-homatropine 5-1.5 MG/5ML syrup Commonly known as:  HYCODAN Take 5-10 mLs by mouth every 6 (six) hours as needed.   HYDROcodone-homatropine 5-1.5 MG/5ML syrup Commonly known as:  HYCODAN Take 5-10 mLs by mouth every 6 (six) hours as needed for cough.   ipratropium-albuterol 0.5-2.5 (3) MG/3ML Soln Commonly known as:  DUONEB Take 3 mLs by nebulization every 6 (six) hours as needed (shortness of breath).   omeprazole 20 MG capsule Commonly known as:  PRILOSEC TAKE 1 CAPSULE DAILY   simvastatin 40 MG tablet Commonly known as:  ZOCOR TAKE 1 TABLET EVERY EVENING   tamsulosin 0.4 MG Caps capsule Commonly known as:  FLOMAX Take 1 capsule (0.4 mg total) by mouth every evening.          Objective:    BP 130/79   Pulse 75   Temp 98.3 F (36.8 C) (Oral)   Ht 5\' 9"  (1.753 m)   Wt 247 lb 12.8 oz (112.4 kg)   BMI 36.59 kg/m   Allergies  Allergen Reactions  . Ivp Dye [Iodinated Diagnostic Agents] Swelling and Other (See Comments)    Arrest  . Morphine And Related Nausea And Vomiting    Wt Readings from Last 3 Encounters:  03/13/17 247 lb 12.8 oz (112.4 kg)  01/29/17 246 lb (111.6 kg)  01/20/17 246 lb (111.6 kg)    Physical Exam  Constitutional: He appears well-developed and well-nourished.  HENT:  Head: Normocephalic and atraumatic.  Right Ear: Hearing and tympanic membrane normal.  Left Ear: Hearing and tympanic membrane normal.  Nose: Mucosal edema and sinus tenderness present. No nasal deformity. Right sinus exhibits frontal sinus tenderness. Left sinus exhibits frontal sinus tenderness.  Mouth/Throat: Posterior oropharyngeal erythema present.  Eyes: Conjunctivae and EOM are normal. Pupils are equal, round, and reactive to light.  Right eye exhibits no discharge. Left eye exhibits no discharge.  Neck: Normal range of motion. Neck supple.  Cardiovascular: Normal rate, regular rhythm and normal heart sounds.  Pulmonary/Chest: Effort normal. No respiratory distress. He has no decreased breath sounds. He has wheezes. He has no rhonchi. He has no rales.  Abdominal: Soft. Bowel sounds are normal.  Musculoskeletal: Normal range of motion.  Skin: Skin is warm and dry.        Assessment & Plan:   1. Hx of total knee arthroplasty, right - diclofenac sodium (VOLTAREN) 1 % GEL; Apply 1 application topically 3 (three) times daily as needed (for hands).  Dispense: 1200 g; Refill: 3  2. Rotator cuff syndrome of left shoulder  3. Bronchitis - HYDROcodone-homatropine (HYCODAN) 5-1.5 MG/5ML syrup; Take 5-10 mLs by mouth every 6 (six) hours as needed for cough.  Dispense: 240 mL; Refill: 0 - azithromycin (ZITHROMAX Z-PAK) 250 MG tablet; Take as directed  Dispense: 6 each; Refill: 0   Continue all other maintenance medications as listed above.  Follow up plan: Return in about 3 months (around 06/10/2017) for recheck, labs.  Educational handout given for W[place]ly Hall PA-C [place] [address]  [place], [place] [address] [phone]   03/13/2017, 9:16 AM

## 2017-03-14 ENCOUNTER — Ambulatory Visit: Payer: Worker's Compensation | Attending: Orthopedic Surgery | Admitting: Physical Therapy

## 2017-03-14 ENCOUNTER — Encounter: Payer: Self-pay | Admitting: Physical Therapy

## 2017-03-14 DIAGNOSIS — R262 Difficulty in walking, not elsewhere classified: Secondary | ICD-10-CM | POA: Diagnosis present

## 2017-03-14 DIAGNOSIS — M25562 Pain in left knee: Secondary | ICD-10-CM | POA: Diagnosis not present

## 2017-03-14 DIAGNOSIS — M25662 Stiffness of left knee, not elsewhere classified: Secondary | ICD-10-CM | POA: Diagnosis present

## 2017-03-14 NOTE — Therapy (Signed)
[facility] [place], [place], [address] Phone: [phone]   Fax:  [phone]  Physical Therapy Treatment  Patient Details  Name: Tony Patrick MRN: [id number] Date of Birth: [birth year] Referring Provider: Dr. Gladstone Lighter   Encounter Date: 03/14/2017  PT End of Session - 03/14/17 0947    Visit Number  16    Number of Visits  24    Date for PT Re-Evaluation  03/05/17    Authorization Type  Worker's Compensation authorized 12 visits    Authorization - Visit Number  36    Authorization - Number of Visits  24    PT Start Time  [id number]    PT Stop Time  1039    PT Time Calculation (min)  53 min    Activity Tolerance  Patient tolerated treatment well    Behavior During Therapy  [facility] for tasks assessed/performed       Past Medical History:  Diagnosis Date  . Arthritis   . Atherosclerotic plaque   . Basal cell carcinoma   . CAD (coronary artery disease)    Anterior MI, [facility], 1995, no PCI, total LAD  / catheterization 2001, 30% LAD beyond the origin of a large diagonal, circumflex normal, RCA normal, anterolateral and apical  akinesis, ejection fraction 25-35% range  . Dyslipidemia   . Ejection fraction < 50%    EF 25-35%, catheterization 2001 / EF 20-25%, global hypokinesis, echo in June, 2012  . GERD (gastroesophageal reflux disease)   . History of bronchitis   . Hypoxia    Pneumonia, hospitalization, June, 2012  . kidney stones   . MI (myocardial infarction) ([place]) 1995  . Pneumonia 2012  . S/P colectomy    Benign tumor    Past Surgical History:  Procedure Laterality Date  . CARDIAC CATHETERIZATION     Patient states he has not had cardiac cath  . CHEST TUBE INSERTION    . COLECTOMY  1981   benign mass  . COLONOSCOPY WITH PROPOFOL N/A 07/04/2014   Procedure: COLONOSCOPY WITH PROPOFOL;  Surgeon: Gatha Mayer, MD;  Location: [facility];  Service: Endoscopy;  Laterality: N/A;  . JOINT REPLACEMENT    . KNEE  ARTHROSCOPY Left 07/19/2015   Procedure: LEFT ARTHROSCOPY KNEE WITH MEDIAL MENISCECTOMY;  Surgeon: Latanya Maudlin, MD;  Location: [facility];  Service: Orthopedics;  Laterality: Left;  LMA  . LEG SURGERY Left    cyst  . PLEURAL SCARIFICATION  1978  . SHOULDER OPEN ROTATOR CUFF REPAIR Left 05/26/2015   Procedure: LEFT OPEN ACROMINECTOMY AND REPAIR WITH GRAFT AND ANCHOR ROTATOR CUFF TEAR ;  Surgeon: Latanya Maudlin, MD;  Location: [facility];  Service: Orthopedics;  Laterality: Left;  Left shoulder block in holding  . TOTAL KNEE ARTHROPLASTY Left 01/29/2017   Procedure: LEFT TOTAL KNEE ARTHROPLASTY;  Surgeon: Latanya Maudlin, MD;  Location: [facility];  Service: Orthopedics;  Laterality: Left;  Adductor Block    There were no vitals filed for this visit.  Subjective Assessment - 03/14/17 0947    Subjective  Still unable to go forward on stationary bike. Reports he is going up stairs now. Sees Dr. Gladstone Lighter on Monday.    Patient is accompained by:  Family member Daughter    Pertinent History  01/29/17 L TKA    Limitations  Walking;Standing    How long can you stand comfortably?  With walker 30 mins    How long can you walk comfortably?  Less than 10 minutes  Diagnostic tests  MRI, X-Ray    Patient Stated Goals  Gain motion.    Currently in Pain?  No/denies         [facility] PT Assessment - 03/14/17 0001      Assessment   Medical Diagnosis  Left total knee replacement    Onset Date/Surgical Date  01/29/17    Next MD Visit  03/17/2017      ROM / Strength   AROM / PROM / Strength  AROM;Strength      AROM   Overall AROM   Deficits    AROM Assessment Site  Knee    Right/Left Knee  Left    Left Knee Extension  -3    Left Knee Flexion  105      Strength   Overall Strength  Within functional limits for tasks performed    Strength Assessment Site  Knee    Right/Left Knee  Left    Left Knee Flexion  4/5    Left Knee Extension  5/5                  [facility] Adult PT Treatment/Exercise -  03/14/17 0001      Ambulation/Gait   Ambulation/Gait  --    Stairs  Yes      Knee/Hip Exercises: Aerobic   Stationary Bike  x15 min seat 8      Knee/Hip Exercises: Machines for Strengthening   Cybex Knee Extension  20# 3x10 reps    Cybex Knee Flexion  40# 3x10 reps    Cybex Leg Press  2 pl, seat 7 x30 reps      Knee/Hip Exercises: Standing   Forward Lunges  Left;20 reps      Modalities   Modalities  Psychologist, educational Location  L knee    Electrical Stimulation Action  IFC    Electrical Stimulation Parameters  1-10 hz x15 min    Electrical Stimulation Goals  Pain;Edema      Vasopneumatic   Number Minutes Vasopneumatic   15 minutes    Vasopnuematic Location   Knee    Vasopneumatic Pressure  Medium    Vasopneumatic Temperature   34      Manual Therapy   Manual Therapy  Passive ROM    Passive ROM  manual PROM into flexion and extension with gentle overpressure to improve ROM.                 PT Short Term Goals - 03/14/17 1033      PT SHORT TERM GOAL #1   Title  Patient will improve left knee flexion AROM to 85 degrees to improve functional mobility.    Time  2    Period  Weeks    Status  Achieved AROM 88 degrees 03/03/17      PT SHORT TERM GOAL #2   Title  Patient will improve left knee extension AROM to -5 degrees to improve functional mobility.    Time  2    Period  Weeks    Status  Achieved      PT SHORT TERM GOAL #3   Title  Patient will decrease pain at rest to less than or equal to 2/10.    Time  2    Period  Weeks    Status  Achieved        PT Long Term Goals - 03/14/17 1033      PT  LONG TERM GOAL #1   Title  Patient will improve Left Flexion knee AROM  to 110 degrees in order to perform functional tasks and ADLs.    Time  4    Period  Weeks    Status  On-going AROM L knee flexion 105 03/14/2017      PT LONG TERM GOAL #2   Title  Patient will improve left knee  extension AROM to 0 degrees to normalize gait pattern and perform functional tasks and ADLs.    Time  4    Period  Weeks    Status  On-going -3 deg from neutral L knee 03/14/2017      PT LONG TERM GOAL #3   Title  Patient will improve left knee strength to 5/5 for adequate knee stabilization to perform functional activities and ADLs.    Time  4    Period  Weeks    Status  Partially Met L knee extensors 5/5, L knee flexors 4/5 03/14/2017      PT LONG TERM GOAL #4   Title  Patient will ascend and descend steps with proper step over step gait pattern with UE support in order to safely enter and exit home..    Time  4    Period  Weeks    Status  Achieved      PT LONG TERM GOAL #5   Title  Patient will ambulate with single point cane with </= 2/10 pain, normalized, step through gait pattern in order to ambulate efficiently within community.    Time  4    Period  Weeks    Status  On-going Antalgic gait noted between exercises in clinic 03/14/2017            Plan - 03/14/17 1035    Clinical Impression Statement  Patient demonstrated great improvement today with achievement of pain goals as well as stairs goals. Lacking with full L knee ROM and MMT as noted in today's note. Patient still limited slightly with stationary bike but no complaints of pain with any therex today. Normal stair technique when ascending and descending although at slower pace. Normal modalities response noted following removal of the modalities.    Rehab Potential  Good    PT Frequency  2x / week    PT Duration  4 weeks    PT Treatment/Interventions  ADLs/Self Care Home Management;Cryotherapy;Electrical Stimulation;Moist Heat;Iontophoresis '4mg'$ /ml Dexamethasone;Therapeutic exercise;Therapeutic activities;Patient/family education;Neuromuscular re-education;Balance training;Manual techniques;Passive range of motion;Vasopneumatic Device    PT Next Visit Plan  Continue POC for ROM and stength    Consulted and Agree with Plan  of Care  Patient;Family member/caregiver    Family Member Consulted  Daughter       Patient will benefit from skilled therapeutic intervention in order to improve the following deficits and impairments:  Abnormal gait, Pain, Decreased mobility, Decreased range of motion, Decreased strength, Difficulty walking, Increased edema  Visit Diagnosis: Acute pain of left knee  Stiffness of left knee, not elsewhere classified  Difficulty in walking, not elsewhere classified     Problem List Patient Active Problem List   Diagnosis Date Noted  . Hx of total knee arthroplasty, right 01/29/2017  . Achilles tendinitis of right lower extremity 11/21/2016  . Dilated cardiomyopathy ([place]) 05/22/2016  . History of MI (myocardial infarction) 05/22/2016  . Benign prostatic hyperplasia with urinary frequency 05/16/2016  . Tear of rotator cuff 05/26/2015  . Shoulder injury 04/06/2015  . Rotator cuff syndrome 04/06/2015  . Special screening for  malignant neoplasms, colon   . CAD (coronary artery disease)   . GERD (gastroesophageal reflux disease)   . Dyslipidemia   . Ejection fraction < 50%   . Hypoxia     Standley Brooking, PTA 03/14/2017, 11:10 AM  [facility] [address] [place], [place], [address] Phone: [phone]   Fax:  [phone]  Name: Tony Patrick MRN: [id number] Date of Birth: [birth year]

## 2017-03-17 ENCOUNTER — Other Ambulatory Visit: Payer: Self-pay | Admitting: Physician Assistant

## 2017-03-17 ENCOUNTER — Ambulatory Visit: Payer: Worker's Compensation | Admitting: Physical Therapy

## 2017-03-17 ENCOUNTER — Encounter: Payer: Self-pay | Admitting: Physical Therapy

## 2017-03-17 DIAGNOSIS — M25662 Stiffness of left knee, not elsewhere classified: Secondary | ICD-10-CM

## 2017-03-17 DIAGNOSIS — M25562 Pain in left knee: Secondary | ICD-10-CM | POA: Diagnosis not present

## 2017-03-17 DIAGNOSIS — R262 Difficulty in walking, not elsewhere classified: Secondary | ICD-10-CM

## 2017-03-17 NOTE — Therapy (Signed)
[facility] [place], [place], [address] Phone: [phone]   Fax:  [phone]  Physical Therapy Treatment  Patient Details  Name: Tony Patrick MRN: [id number] Date of Birth: 04-16-1950 Referring Provider: Dr. Gladstone Lighter   Encounter Date: 03/17/2017  PT End of Session - 03/17/17 0959    Visit Number  17    Number of Visits  24    Date for PT Re-Evaluation  03/05/17    Authorization Type  Worker's Compensation authorized 12 visits    PT Start Time  [phone]    PT Stop Time  1044    PT Time Calculation (min)  60 min    Activity Tolerance  Patient tolerated treatment well    Behavior During Therapy  [facility] for tasks assessed/performed       Past Medical History:  Diagnosis Date  . Arthritis   . Atherosclerotic plaque   . Basal cell carcinoma   . CAD (coronary artery disease)    Anterior MI, [facility], 1995, no PCI, total LAD  / catheterization 2001, 30% LAD beyond the origin of a large diagonal, circumflex normal, RCA normal, anterolateral and apical  akinesis, ejection fraction 25-35% range  . Dyslipidemia   . Ejection fraction < 50%    EF 25-35%, catheterization 2001 / EF 20-25%, global hypokinesis, echo in June, 2012  . GERD (gastroesophageal reflux disease)   . History of bronchitis   . Hypoxia    Pneumonia, hospitalization, June, 2012  . kidney stones   . MI (myocardial infarction) ([place]) 1995  . Pneumonia 2012  . S/P colectomy    Benign tumor    Past Surgical History:  Procedure Laterality Date  . CARDIAC CATHETERIZATION     Patient states he has not had cardiac cath  . CHEST TUBE INSERTION    . COLECTOMY  1981   benign mass  . COLONOSCOPY WITH PROPOFOL N/A 07/04/2014   Procedure: COLONOSCOPY WITH PROPOFOL;  Surgeon: Gatha Mayer, MD;  [facility];  Service: Endoscopy;  Laterality: N/A;  . JOINT REPLACEMENT    . KNEE ARTHROSCOPY Left 07/19/2015   Procedure: LEFT ARTHROSCOPY KNEE WITH MEDIAL MENISCECTOMY;   Surgeon: Latanya Maudlin, MD;  [facility]: [facility];  Service: Orthopedics;  Laterality: Left;  LMA  . LEG SURGERY Left    cyst  . PLEURAL SCARIFICATION  1978  . SHOULDER OPEN ROTATOR CUFF REPAIR Left 05/26/2015   Procedure: LEFT OPEN ACROMINECTOMY AND REPAIR WITH GRAFT AND ANCHOR ROTATOR CUFF TEAR ;  Surgeon: Latanya Maudlin, MD;  [facility];  Service: Orthopedics;  Laterality: Left;  Left shoulder block in holding  . TOTAL KNEE ARTHROPLASTY Left 01/29/2017   Procedure: LEFT TOTAL KNEE ARTHROPLASTY;  Surgeon: Latanya Maudlin, MD;  [facility]: [facility];  Service: Orthopedics;  Laterality: Left;  Adductor Block    There were no vitals filed for this visit.  Subjective Assessment - 03/17/17 0952    Subjective  Patient reported having a good weekend and able to perform bike full rotation backward and forward    Pertinent History  01/29/17 L TKA    Limitations  Walking;Standing    How long can you stand comfortably?  With walker 30 mins    How long can you walk comfortably?  Less than 10 minutes    Diagnostic tests  MRI, X-Ray    Patient Stated Goals  Gain motion.    Currently in Pain?  Yes    Pain Score  1     Pain  Location  Knee    Pain Orientation  Left    Pain Descriptors / Indicators  Tightness    Pain Type  Surgical pain    Pain Onset  More than a month ago    Pain Frequency  Intermittent    Aggravating Factors   ROM in knee    Pain Relieving Factors  rest and ice         [facility] PT Assessment - 03/17/17 0001      AROM   AROM Assessment Site  Knee    Right/Left Knee  Left    Left Knee Extension  -6    Left Knee Flexion  96      PROM   PROM Assessment Site  Knee    Right/Left Knee  Left    Left Knee Extension  -3    Left Knee Flexion  107      Strength   Strength Assessment Site  Knee    Right/Left Knee  Left    Left Knee Flexion  4/5    Left Knee Extension  5/5                  [facility] Adult PT Treatment/Exercise - 03/17/17 0001      Knee/Hip Exercises:  Aerobic   Stationary Bike  x15 min seat 8      Knee/Hip Exercises: Machines for Strengthening   Cybex Knee Extension  20# 3x10 reps    Cybex Knee Flexion  40# 3x10 reps    Cybex Leg Press  2 pl, seat 7 x30 reps      Electrical Stimulation   Electrical Stimulation Location  L knee    Electrical Stimulation Action  IFC    Electrical Stimulation Parameters  1-10hz  x107mn    Electrical Stimulation Goals  Pain;Edema      Vasopneumatic   Number Minutes Vasopneumatic   15 minutes    Vasopnuematic Location   Knee    Vasopneumatic Pressure  Medium      Manual Therapy   Manual Therapy  Passive ROM    Passive ROM  manual PROM into flexion and extension with gentle overpressure to improve ROM.                 PT Short Term Goals - 03/14/17 1033      PT SHORT TERM GOAL #1   Title  Patient will improve left knee flexion AROM to 85 degrees to improve functional mobility.    Time  2    Period  Weeks    Status  Achieved AROM 88 degrees 03/03/17      PT SHORT TERM GOAL #2   Title  Patient will improve left knee extension AROM to -5 degrees to improve functional mobility.    Time  2    Period  Weeks    Status  Achieved      PT SHORT TERM GOAL #3   Title  Patient will decrease pain at rest to less than or equal to 2/10.    Time  2    Period  Weeks    Status  Achieved        PT Long Term Goals - 03/14/17 1033      PT LONG TERM GOAL #1   Title  Patient will improve Left Flexion knee AROM  to 110 degrees in order to perform functional tasks and ADLs.    Time  4    Period  Weeks    Status  On-going AROM L knee flexion 105 03/14/2017      PT LONG TERM GOAL #2   Title  Patient will improve left knee extension AROM to 0 degrees to normalize gait pattern and perform functional tasks and ADLs.    Time  4    Period  Weeks    Status  On-going -3 deg from neutral L knee 03/14/2017      PT LONG TERM GOAL #3   Title  Patient will improve left knee strength to 5/5 for adequate knee  stabilization to perform functional activities and ADLs.    Time  4    Period  Weeks    Status  Partially Met L knee extensors 5/5, L knee flexors 4/5 03/14/2017      PT LONG TERM GOAL #4   Title  Patient will ascend and descend steps with proper step over step gait pattern with UE support in order to safely enter and exit home..    Time  4    Period  Weeks    Status  Achieved      PT LONG TERM GOAL #5   Title  Patient will ambulate with single point cane with </= 2/10 pain, normalized, step through gait pattern in order to ambulate efficiently within community.    Time  4    Period  Weeks    Status  On-going Antalgic gait noted between exercises in clinic 03/14/2017            Plan - April 13, 2017 1028    Clinical Impression Statement  Patient tolerated treatment well today and able to complete all exercises with no reported discomfort. Patient progressing with right knee ROM active and passively as well as knee strengthening. Patient has some ongoing limitations due to edema. FOTO 20% limitation. Goals ongoing.     Rehab Potential  Good    PT Frequency  2x / week    PT Duration  4 weeks    PT Treatment/Interventions  ADLs/Self Care Home Management;Cryotherapy;Electrical Stimulation;Moist Heat;Iontophoresis 5m/ml Dexamethasone;Therapeutic exercise;Therapeutic activities;Patient/family education;Neuromuscular re-education;Balance training;Manual techniques;Passive range of motion;Vasopneumatic Device    PT Next Visit Plan  Continue POC for ROM and stength    Consulted and Agree with Plan of Care  Patient       Patient will benefit from skilled therapeutic intervention in order to improve the following deficits and impairments:  Abnormal gait, Pain, Decreased mobility, Decreased range of motion, Decreased strength, Difficulty walking, Increased edema  Visit Diagnosis: Acute pain of left knee  Stiffness of left knee, not elsewhere classified  Difficulty in walking, not elsewhere  classified   G-Codes - [id number]    Functional Assessment Tool Used (Outpatient Only)  FOTO 20% limitation 17 visit       Problem List Patient Active Problem List   Diagnosis Date Noted  . Hx of total knee arthroplasty, right 01/29/2017  . Achilles tendinitis of right lower extremity 11/21/2016  . [place] 05[place]  . History of MI (myocardial infarction) 05/22/2016  . Benign prostatic hyperplasia with urinary frequency 05/16/2016  . Tear of rotator cuff 05/26/2015  . Shoulder injury 04/06/2015  . Rotator cuff syndrome 04/06/2015  . Special screening for malignant neoplasms, colon   . CAD (coronary artery disease)   . GERD (gastroesophageal reflux disease)   . Dyslipidemia   . Ejection fraction < 50%   . Hypoxia     CLadean Raya PTA 003-31-1910:50 AM  [facility] [address]  [place], [place], [address] Phone: [phone]   Fax:  [phone]  Name: Tony Patrick MRN: [id number] Date of Birth: 19-May-1950

## 2017-03-19 ENCOUNTER — Ambulatory Visit: Payer: Worker's Compensation | Admitting: Physical Therapy

## 2017-03-19 ENCOUNTER — Encounter: Payer: Self-pay | Admitting: Physical Therapy

## 2017-03-19 DIAGNOSIS — M25662 Stiffness of left knee, not elsewhere classified: Secondary | ICD-10-CM

## 2017-03-19 DIAGNOSIS — R262 Difficulty in walking, not elsewhere classified: Secondary | ICD-10-CM

## 2017-03-19 DIAGNOSIS — M25562 Pain in left knee: Secondary | ICD-10-CM | POA: Diagnosis not present

## 2017-03-19 NOTE — Therapy (Signed)
[facility] [place], [place], [address] Phone: [phone]   Fax:  [phone]  Physical Therapy Treatment  Patient Details  Name: Tony Patrick MRN: [id number] Date of Birth: 24-Aug-1950 Referring Provider: Dr. Gladstone Lighter   Encounter Date: 03/19/2017  PT End of Session - 03/19/17 0902    Visit Number  18    Number of Visits  24    Date for PT Re-Evaluation  03/05/17    Authorization Type  Worker's Compensation authorized 12 visits    PT Start Time  0815    PT Stop Time  0913    PT Time Calculation (min)  58 min    Activity Tolerance  Patient tolerated treatment well    Behavior During Therapy  [facility] for tasks assessed/performed       Past Medical History:  Diagnosis Date  . Arthritis   . Atherosclerotic plaque   . Basal cell carcinoma   . CAD (coronary artery disease)    Anterior MI, [facility], 1995, no PCI, total LAD  / catheterization 2001, 30% LAD beyond the origin of a large diagonal, circumflex normal, RCA normal, anterolateral and apical  akinesis, ejection fraction 25-35% range  . Dyslipidemia   . Ejection fraction < 50%    EF 25-35%, catheterization 2001 / EF 20-25%, global hypokinesis, echo in June, 2012  . GERD (gastroesophageal reflux disease)   . History of bronchitis   . Hypoxia    Pneumonia, hospitalization, June, 2012  . kidney stones   . MI (myocardial infarction) ([place]) 1995  . Pneumonia 2012  . S/P colectomy    Benign tumor    Past Surgical History:  Procedure Laterality Date  . CARDIAC CATHETERIZATION     Patient states he has not had cardiac cath  . CHEST TUBE INSERTION    . COLECTOMY  1981   benign mass  . COLONOSCOPY WITH PROPOFOL N/A 07/04/2014   Procedure: COLONOSCOPY WITH PROPOFOL;  Surgeon: Gatha Mayer, MD;  Location: [facility];  Service: Endoscopy;  Laterality: N/A;  . JOINT REPLACEMENT    . KNEE ARTHROSCOPY Left 07/19/2015   Procedure: LEFT ARTHROSCOPY KNEE WITH MEDIAL MENISCECTOMY;   Surgeon: Latanya Maudlin, MD;  Location: [facility];  Service: Orthopedics;  Laterality: Left;  LMA  . LEG SURGERY Left    cyst  . PLEURAL SCARIFICATION  1978  . SHOULDER OPEN ROTATOR CUFF REPAIR Left 05/26/2015   Procedure: LEFT OPEN ACROMINECTOMY AND REPAIR WITH GRAFT AND ANCHOR ROTATOR CUFF TEAR ;  Surgeon: Latanya Maudlin, MD;  Location: [facility];  Service: Orthopedics;  Laterality: Left;  Left shoulder block in holding  . TOTAL KNEE ARTHROPLASTY Left 01/29/2017   Procedure: LEFT TOTAL KNEE ARTHROPLASTY;  Surgeon: Latanya Maudlin, MD;  Location: [facility];  Service: Orthopedics;  Laterality: Left;  Adductor Block    There were no vitals filed for this visit.  Subjective Assessment - 03/19/17 0818    Subjective  Patient reported no new complints upon arrival    Pertinent History  01/29/17 L TKA    Limitations  Walking;Standing    How long can you stand comfortably?  With walker 30 mins    How long can you walk comfortably?  Less than 10 minutes    Diagnostic tests  MRI, X-Ray    Patient Stated Goals  Gain motion.    Currently in Pain?  Yes    Pain Score  1     Pain Location  Knee    Pain Orientation  Left    Pain Descriptors / Indicators  Tightness    Pain Type  Surgical pain    Pain Onset  More than a month ago    Pain Frequency  Intermittent    Aggravating Factors   ROM in knee    Pain Relieving Factors  rest         [facility] PT Assessment - 03/19/17 0001      AROM   AROM Assessment Site  Knee    Right/Left Knee  Left    Left Knee Extension  -7    Left Knee Flexion  100      PROM   PROM Assessment Site  Knee    Right/Left Knee  Left    Left Knee Extension  -3    Left Knee Flexion  110                  [facility] Adult PT Treatment/Exercise - 03/19/17 0001      Knee/Hip Exercises: Aerobic   Nustep  L5 x39mn       Knee/Hip Exercises: Machines for Strengthening   Cybex Knee Extension  20# 2x25 reps    Cybex Knee Flexion  40# 2x25 reps    Cybex Leg Press  2 pl, seat 7  x50 reps      Electrical Stimulation   Electrical Stimulation Location  L knee    Electrical Stimulation Action  IFC    Electrical Stimulation Parameters  1-_0  x138m    Electrical Stimulation Goals  Pain;Edema      Vasopneumatic   Number Minutes Vasopneumatic   15 minutes    Vasopnuematic Location   Knee    Vasopneumatic Pressure  Medium      Manual Therapy   Manual Therapy  Passive ROM    Passive ROM  manual PROM into flexion and extension with gentle overpressure to improve ROM.                 PT Short Term Goals - 03/14/17 1033      PT SHORT TERM GOAL #1   Title  Patient will improve left knee flexion AROM to 85 degrees to improve functional mobility.    Time  2    Period  Weeks    Status  Achieved AROM 88 degrees 03/03/17      PT SHORT TERM GOAL #2   Title  Patient will improve left knee extension AROM to -5 degrees to improve functional mobility.    Time  2    Period  Weeks    Status  Achieved      PT SHORT TERM GOAL #3   Title  Patient will decrease pain at rest to less than or equal to 2/10.    Time  2    Period  Weeks    Status  Achieved        PT Long Term Goals - 03/14/17 1033      PT LONG TERM GOAL #1   Title  Patient will improve Left Flexion knee AROM  to 110 degrees in order to perform functional tasks and ADLs.    Time  4    Period  Weeks    Status  On-going AROM L knee flexion 105 03/14/2017      PT LONG TERM GOAL #2   Title  Patient will improve left knee extension AROM to 0 degrees to normalize gait pattern and perform functional tasks and ADLs.    Time  4  Period  Weeks    Status  On-going -3 deg from neutral L knee 03/14/2017      PT LONG TERM GOAL #3   Title  Patient will improve left knee strength to 5/5 for adequate knee stabilization to perform functional activities and ADLs.    Time  4    Period  Weeks    Status  Partially Met L knee extensors 5/5, L knee flexors 4/5 03/14/2017      PT LONG TERM GOAL #4   Title  Patient  will ascend and descend steps with proper step over step gait pattern with UE support in order to safely enter and exit home..    Time  4    Period  Weeks    Status  Achieved      PT LONG TERM GOAL #5   Title  Patient will ambulate with single point cane with </= 2/10 pain, normalized, step through gait pattern in order to ambulate efficiently within community.    Time  4    Period  Weeks    Status  On-going Antalgic gait noted between exercises in clinic 03/14/2017            Plan - 03/19/17 0903    Clinical Impression Statement  Patient tolerated treatment well today and able to increase reps for left knee strenthening progression. Patient has improved active and passive flexion ROM today. Patient has been progressing to remaining goals, ongoing due to Full range limitations.     Rehab Potential  Good    PT Frequency  2x / week    PT Duration  4 weeks    PT Treatment/Interventions  ADLs/Self Care Home Management;Cryotherapy;Electrical Stimulation;Moist Heat;Iontophoresis 85m/ml Dexamethasone;Therapeutic exercise;Therapeutic activities;Patient/family education;Neuromuscular re-education;Balance training;Manual techniques;Passive range of motion;Vasopneumatic Device    PT Next Visit Plan  Continue POC for ROM and stength (MD. Gioffre appt on 03/31/17) will need note friday    Consulted and Agree with Plan of Care  Patient       Patient will benefit from skilled therapeutic intervention in order to improve the following deficits and impairments:  Abnormal gait, Pain, Decreased mobility, Decreased range of motion, Decreased strength, Difficulty walking, Increased edema  Visit Diagnosis: Acute pain of left knee  Stiffness of left knee, not elsewhere classified  Difficulty in walking, not elsewhere classified     Problem List Patient Active Problem List   Diagnosis Date Noted  . Hx of total knee arthroplasty, right 01/29/2017  . Achilles tendinitis of right lower extremity  11/21/2016  . Dilated cardiomyopathy ([place] 05/22/2016  . History of MI (myocardial infarction) 05/22/2016  . Benign prostatic hyperplasia with urinary frequency 05/16/2016  . Tear of rotator cuff 05/26/2015  . Shoulder injury 04/06/2015  . Rotator cuff syndrome 04/06/2015  . Special screening for malignant neoplasms, colon   . CAD (coronary artery disease)   . GERD (gastroesophageal reflux disease)   . Dyslipidemia   . Ejection fraction < 50%   . Hypoxia     Tayshon Winker P, PTA 03/19/2017, 9:16 AM  [facility] [address]Phone: [phone]  Fax:  [phone] Name: CCaldwell KronenbergerMRN: [id number] of Birth: 907-27-1952

## 2017-03-21 ENCOUNTER — Encounter: Payer: Self-pay | Admitting: Physical Therapy

## 2017-03-21 ENCOUNTER — Ambulatory Visit: Payer: Worker's Compensation | Admitting: Physical Therapy

## 2017-03-21 DIAGNOSIS — M25562 Pain in left knee: Secondary | ICD-10-CM

## 2017-03-21 DIAGNOSIS — R262 Difficulty in walking, not elsewhere classified: Secondary | ICD-10-CM

## 2017-03-21 DIAGNOSIS — M25662 Stiffness of left knee, not elsewhere classified: Secondary | ICD-10-CM

## 2017-03-21 NOTE — Therapy (Signed)
[facility] [place], [place], [address] Phone: [phone]   Fax:  [phone]  Physical Therapy Treatment  Patient Details  Name: Tony Patrick MRN: [id number] Date of Birth: 18-Aug-1950 Referring Provider: Dr. Gladstone Lighter   Encounter Date: 03/21/2017  PT End of Session - 03/21/17 1005    Visit Number  19    Number of Visits  24    Date for PT Re-Evaluation  03/05/17    Authorization Type  Worker's Compensation authorized 12 visits    Authorization - Visit Number  57    Authorization - Number of Visits  24    PT Start Time  [phone]    PT Stop Time  1048    PT Time Calculation (min)  61 min    Activity Tolerance  Patient tolerated treatment well    Behavior During Therapy  [facility] for tasks assessed/performed       Past Medical History:  Diagnosis Date  . Arthritis   . Atherosclerotic plaque   . Basal cell carcinoma   . CAD (coronary artery disease)    Anterior MI, [facility], 1995, no PCI, total LAD  / catheterization 2001, 30% LAD beyond the origin of a large diagonal, circumflex normal, RCA normal, anterolateral and apical  akinesis, ejection fraction 25-35% range  . Dyslipidemia   . Ejection fraction < 50%    EF 25-35%, catheterization 2001 / EF 20-25%, global hypokinesis, echo in June, 2012  . GERD (gastroesophageal reflux disease)   . History of bronchitis   . Hypoxia    Pneumonia, hospitalization, June, 2012  . kidney stones   . MI (myocardial infarction) ([place]) 1995  . Pneumonia 2012  . S/P colectomy    Benign tumor    Past Surgical History:  Procedure Laterality Date  . CARDIAC CATHETERIZATION     Patient states he has not had cardiac cath  . CHEST TUBE INSERTION    . COLECTOMY  1981   benign mass  . COLONOSCOPY WITH PROPOFOL N/A 07/04/2014   Procedure: COLONOSCOPY WITH PROPOFOL;  Surgeon: Gatha Mayer, MD;  Location: [facility];  Service: Endoscopy;  Laterality: N/A;  . JOINT REPLACEMENT    . KNEE  ARTHROSCOPY Left 07/19/2015   Procedure: LEFT ARTHROSCOPY KNEE WITH MEDIAL MENISCECTOMY;  Surgeon: Latanya Maudlin, MD;  Location: [facility];  Service: Orthopedics;  Laterality: Left;  LMA  . LEG SURGERY Left    cyst  . PLEURAL SCARIFICATION  1978  . SHOULDER OPEN ROTATOR CUFF REPAIR Left 05/26/2015   Procedure: LEFT OPEN ACROMINECTOMY AND REPAIR WITH GRAFT AND ANCHOR ROTATOR CUFF TEAR ;  Surgeon: Latanya Maudlin, MD;  Location: [facility];  Service: Orthopedics;  Laterality: Left;  Left shoulder block in holding  . TOTAL KNEE ARTHROPLASTY Left 01/29/2017   Procedure: LEFT TOTAL KNEE ARTHROPLASTY;  Surgeon: Latanya Maudlin, MD;  Location: [facility];  Service: Orthopedics;  Laterality: Left;  Adductor Block    There were no vitals filed for this visit.  Subjective Assessment - 03/21/17 0952    Subjective  Reports that he was told to do knee flexion with ankleweights in supine to assist with flexion.    Pertinent History  01/29/17 L TKA    Limitations  Walking;Standing    How long can you stand comfortably?  With walker 30 mins    How long can you walk comfortably?  Less than 10 minutes    Diagnostic tests  MRI, X-Ray    Patient Stated Goals  Gain motion.  Currently in Pain?  Yes    Pain Score  1     Pain Location  Knee    Pain Orientation  Left    Pain Descriptors / Indicators  Sore    Pain Type  Surgical pain    Pain Onset  More than a month ago         [facility] PT Assessment - 03/21/17 0001      Assessment   Medical Diagnosis  Left total knee replacement    Onset Date/Surgical Date  01/29/17    Next MD Visit  03/31/2017      AROM   Left Knee Flexion  107                  [facility] Adult PT Treatment/Exercise - 03/21/17 0001      Knee/Hip Exercises: Aerobic   Stationary Bike  L2 x15 min (3.2 mi)      Knee/Hip Exercises: Machines for Strengthening   Cybex Knee Extension  20# 2x25 reps    Cybex Knee Flexion  40# 2x25 reps    Cybex Leg Press  3 pl, seat 5 x50 reps for ROM       Knee/Hip Exercises: Supine   Knee Flexion  AAROM;Left;10 reps wall slides with 5# anklweight and 5 sec hold      Modalities   Modalities  Electrical Stimulation;Vasopneumatic      Acupuncturist Location  L knee    Electrical Stimulation Action  IFC    Electrical Stimulation Parameters  1-10 hz x15 min    Electrical Stimulation Goals  Pain;Edema      Vasopneumatic   Number Minutes Vasopneumatic   15 minutes    Vasopnuematic Location   Knee    Vasopneumatic Pressure  Medium    Vasopneumatic Temperature   34      Manual Therapy   Manual Therapy  Myofascial release    Myofascial Release  IASTW to L knee, distal quad and surrounding patella to reduce pain and adhesions               PT Short Term Goals - 03/14/17 1033      PT SHORT TERM GOAL #1   Title  Patient will improve left knee flexion AROM to 85 degrees to improve functional mobility.    Time  2    Period  Weeks    Status  Achieved AROM 88 degrees 03/03/17      PT SHORT TERM GOAL #2   Title  Patient will improve left knee extension AROM to -5 degrees to improve functional mobility.    Time  2    Period  Weeks    Status  Achieved      PT SHORT TERM GOAL #3   Title  Patient will decrease pain at rest to less than or equal to 2/10.    Time  2    Period  Weeks    Status  Achieved        PT Long Term Goals - 03/14/17 1033      PT LONG TERM GOAL #1   Title  Patient will improve Left Flexion knee AROM  to 110 degrees in order to perform functional tasks and ADLs.    Time  4    Period  Weeks    Status  On-going AROM L knee flexion 105 03/14/2017      PT LONG TERM GOAL #2   Title  Patient will improve  left knee extension AROM to 0 degrees to normalize gait pattern and perform functional tasks and ADLs.    Time  4    Period  Weeks    Status  On-going -3 deg from neutral L knee 03/14/2017      PT LONG TERM GOAL #3   Title  Patient will improve left knee strength to 5/5 for  adequate knee stabilization to perform functional activities and ADLs.    Time  4    Period  Weeks    Status  Partially Met L knee extensors 5/5, L knee flexors 4/5 03/14/2017      PT LONG TERM GOAL #4   Title  Patient will ascend and descend steps with proper step over step gait pattern with UE support in order to safely enter and exit home..    Time  4    Period  Weeks    Status  Achieved      PT LONG TERM GOAL #5   Title  Patient will ambulate with single point cane with </= 2/10 pain, normalized, step through gait pattern in order to ambulate efficiently within community.    Time  4    Period  Weeks    Status  On-going Antalgic gait noted between exercises in clinic 03/14/2017            Plan - 03/21/17 1045    Clinical Impression Statement  Patient tolerated today's treatment well with all exercises but still slightly limited with ROM still. Patient still limited with ROM and was instructed with the wall slides. AROM of L knee measured as 107 deg after wall slides. IASTW completed to L knee and surrounding patella to reduce adhesions that limits ROM. Good redness response of the L knee with the IASTW and patient reported feeling less stiffness following end of IASTW. Normal modalities response noted following removal of the modalities.    Rehab Potential  Good    PT Frequency  2x / week    PT Duration  4 weeks    PT Treatment/Interventions  ADLs/Self Care Home Management;Cryotherapy;Electrical Stimulation;Moist Heat;Iontophoresis '4mg'$ /ml Dexamethasone;Therapeutic exercise;Therapeutic activities;Patient/family education;Neuromuscular re-education;Balance training;Manual techniques;Passive range of motion;Vasopneumatic Device    PT Next Visit Plan  Continue POC for ROM and stength (MD. Gioffre appt on 03/31/17) will need note friday    Consulted and Agree with Plan of Care  Patient       Patient will benefit from skilled therapeutic intervention in order to improve the following  deficits and impairments:  Abnormal gait, Pain, Decreased mobility, Decreased range of motion, Decreased strength, Difficulty walking, Increased edema  Visit Diagnosis: Acute pain of left knee  Stiffness of left knee, not elsewhere classified  Difficulty in walking, not elsewhere classified     Problem List Patient Active Problem List   Diagnosis Date Noted  . Hx of total knee arthroplasty, right 01/29/2017  . Achilles tendinitis of right lower extremity 11/21/2016  . Dilated cardiomyopathy ([place]) 05/22/2016  . History of MI (myocardial infarction) 05/22/2016  . Benign prostatic hyperplasia with urinary frequency 05/16/2016  . Tear of rotator cuff 05/26/2015  . Shoulder injury 04/06/2015  . Rotator cuff syndrome 04/06/2015  . Special screening for malignant neoplasms, colon   . CAD (coronary artery disease)   . GERD (gastroesophageal reflux disease)   . Dyslipidemia   . Ejection fraction < 50%   . Hypoxia     Standley Brooking, PTA 03/21/2017, 11:23 AM  [facility] [address]  [place], [place], [address] Phone: [phone]   Fax:  [phone]  Name: Tony Patrick MRN: [id number] Date of Birth: 30-Jun-1950

## 2017-03-24 ENCOUNTER — Encounter: Payer: Self-pay | Admitting: Physical Therapy

## 2017-03-24 ENCOUNTER — Ambulatory Visit: Payer: Worker's Compensation | Admitting: Physical Therapy

## 2017-03-24 DIAGNOSIS — M25562 Pain in left knee: Secondary | ICD-10-CM

## 2017-03-24 DIAGNOSIS — R262 Difficulty in walking, not elsewhere classified: Secondary | ICD-10-CM

## 2017-03-24 DIAGNOSIS — M25662 Stiffness of left knee, not elsewhere classified: Secondary | ICD-10-CM

## 2017-03-24 NOTE — Therapy (Signed)
[facility] [place], [place], [address] Phone: [phone]   Fax:  [phone]  Physical Therapy Treatment  Patient Details  Name: Tony Patrick MRN: [id number] Date of Birth: April 04, 1950 Referring Provider: Dr. Gladstone Lighter   Encounter Date: 03/24/2017  PT End of Session - 03/24/17 1008    Visit Number  20    Number of Visits  24    Date for PT Re-Evaluation  03/05/17    Authorization - Visit Number  17    Authorization - Number of Visits  24    PT Start Time  0945    PT Stop Time  7169    PT Time Calculation (min)  59 min    Activity Tolerance  Patient tolerated treatment well    Behavior During Therapy  [facility] for tasks assessed/performed       Past Medical History:  Diagnosis Date  . Arthritis   . Atherosclerotic plaque   . Basal cell carcinoma   . CAD (coronary artery disease)    Anterior MI, [facility], 1995, no PCI, total LAD  / catheterization 2001, 30% LAD beyond the origin of a large diagonal, circumflex normal, RCA normal, anterolateral and apical  akinesis, ejection fraction 25-35% range  . Dyslipidemia   . Ejection fraction < 50%    EF 25-35%, catheterization 2001 / EF 20-25%, global hypokinesis, echo in June, 2012  . GERD (gastroesophageal reflux disease)   . History of bronchitis   . Hypoxia    Pneumonia, hospitalization, June, 2012  . kidney stones   . MI (myocardial infarction) ([place]) 1995  . Pneumonia 2012  . S/P colectomy    Benign tumor    Past Surgical History:  Procedure Laterality Date  . CARDIAC CATHETERIZATION     Patient states he has not had cardiac cath  . CHEST TUBE INSERTION    . COLECTOMY  1981   benign mass  . COLONOSCOPY WITH PROPOFOL N/A 07/04/2014   Procedure: COLONOSCOPY WITH PROPOFOL;  Surgeon: Gatha Mayer, MD;  Location: [facility];  Service: Endoscopy;  Laterality: N/A;  . JOINT REPLACEMENT    . KNEE ARTHROSCOPY Left 07/19/2015   Procedure: LEFT ARTHROSCOPY KNEE WITH MEDIAL  MENISCECTOMY;  Surgeon: Latanya Maudlin, MD;  Location: [facility];  Service: Orthopedics;  Laterality: Left;  LMA  . LEG SURGERY Left    cyst  . PLEURAL SCARIFICATION  1978  . SHOULDER OPEN ROTATOR CUFF REPAIR Left 05/26/2015   Procedure: LEFT OPEN ACROMINECTOMY AND REPAIR WITH GRAFT AND ANCHOR ROTATOR CUFF TEAR ;  Surgeon: Latanya Maudlin, MD;  Location: [facility];  Service: Orthopedics;  Laterality: Left;  Left shoulder block in holding  . TOTAL KNEE ARTHROPLASTY Left 01/29/2017   Procedure: LEFT TOTAL KNEE ARTHROPLASTY;  Surgeon: Latanya Maudlin, MD;  Location: [facility];  Service: Orthopedics;  Laterality: Left;  Adductor Block    There were no vitals filed for this visit.  Subjective Assessment - 03/24/17 0951    Subjective  Patient reported ongoing stiffness    Pertinent History  01/29/17 L TKA    Limitations  Walking;Standing    How long can you stand comfortably?  With walker 30 mins    How long can you walk comfortably?  Less than 10 minutes    Diagnostic tests  MRI, X-Ray    Patient Stated Goals  Gain motion.    Currently in Pain?  Yes    Pain Score  1     Pain Location  Knee  Pain Orientation  Left    Pain Descriptors / Indicators  Sore    Pain Type  Surgical pain    Pain Onset  More than a month ago    Pain Frequency  Intermittent    Aggravating Factors   ROM in knee    Pain Relieving Factors  rest                      [facility] Adult PT Treatment/Exercise - 03/24/17 0001      Knee/Hip Exercises: Aerobic   Stationary Bike  L2 x15 min (3.2 mi)      Knee/Hip Exercises: Machines for Strengthening   Cybex Knee Extension  20# 2x25 reps    Cybex Knee Flexion  40# 2x25 reps    Cybex Leg Press  3 pl, seat 5 x50 reps for ROM      Electrical Stimulation   Electrical Stimulation Location  L knee    Electrical Stimulation Action  IFC    Electrical Stimulation Parameters  10-10hz  x25mn    Electrical Stimulation Goals  Pain;Edema      Vasopneumatic   Number Minutes  Vasopneumatic   15 minutes    Vasopnuematic Location   Knee    Vasopneumatic Pressure  Medium      Manual Therapy   Manual Therapy  Myofascial release;Passive ROM    Myofascial Release  IASTW to L knee, distal quad and surrounding patella to reduce pain and adhesions    Passive ROM  manual PROM into flexion and extension with gentle overpressure to improve ROM.                 PT Short Term Goals - 03/14/17 1033      PT SHORT TERM GOAL #1   Title  Patient will improve left knee flexion AROM to 85 degrees to improve functional mobility.    Time  2    Period  Weeks    Status  Achieved AROM 88 degrees 03/03/17      PT SHORT TERM GOAL #2   Title  Patient will improve left knee extension AROM to -5 degrees to improve functional mobility.    Time  2    Period  Weeks    Status  Achieved      PT SHORT TERM GOAL #3   Title  Patient will decrease pain at rest to less than or equal to 2/10.    Time  2    Period  Weeks    Status  Achieved        PT Long Term Goals - 03/14/17 1033      PT LONG TERM GOAL #1   Title  Patient will improve Left Flexion knee AROM  to 110 degrees in order to perform functional tasks and ADLs.    Time  4    Period  Weeks    Status  On-going AROM L knee flexion 105 03/14/2017      PT LONG TERM GOAL #2   Title  Patient will improve left knee extension AROM to 0 degrees to normalize gait pattern and perform functional tasks and ADLs.    Time  4    Period  Weeks    Status  On-going -3 deg from neutral L knee 03/14/2017      PT LONG TERM GOAL #3   Title  Patient will improve left knee strength to 5/5 for adequate knee stabilization to perform functional activities and ADLs.  Time  4    Period  Weeks    Status  Partially Met L knee extensors 5/5, L knee flexors 4/5 03/14/2017      PT LONG TERM GOAL #4   Title  Patient will ascend and descend steps with proper step over step gait pattern with UE support in order to safely enter and exit home..    Time   4    Period  Weeks    Status  Achieved      PT LONG TERM GOAL #5   Title  Patient will ambulate with single point cane with </= 2/10 pain, normalized, step through gait pattern in order to ambulate efficiently within community.    Time  4    Period  Weeks    Status  On-going Antalgic gait noted between exercises in clinic 03/14/2017            Plan - 03/24/17 1009    Clinical Impression Statement  Patient continues to progress overall and tolerated treatment well. Patient able to complate all exercises and reported improvement after IASTM. Patient has some ongoing limitations with full active ROM in left knee. MD F/U next week. Goals ongoing.     Rehab Potential  Good    PT Frequency  2x / week    PT Duration  4 weeks    PT Treatment/Interventions  ADLs/Self Care Home Management;Cryotherapy;Electrical Stimulation;Moist Heat;Iontophoresis 82m/ml Dexamethasone;Therapeutic exercise;Therapeutic activities;Patient/family education;Neuromuscular re-education;Balance training;Manual techniques;Passive range of motion;Vasopneumatic Device    PT Next Visit Plan  Continue POC for ROM and stength (MD. Gioffre appt on 03/31/17) will need note friday       Patient will benefit from skilled therapeutic intervention in order to improve the following deficits and impairments:  Abnormal gait, Pain, Decreased mobility, Decreased range of motion, Decreased strength, Difficulty walking, Increased edema  Visit Diagnosis: Acute pain of left knee  Stiffness of left knee, not elsewhere classified  Difficulty in walking, not elsewhere classified     Problem List Patient Active Problem List   Diagnosis Date Noted  . Hx of total knee arthroplasty, right 01/29/2017  . Achilles tendinitis of right lower extremity 11/21/2016  . [place] 05/22[place]  . History of MI (myocardial infarction) 05/22/2016  . Benign prostatic hyperplasia with urinary frequency 05/16/2016  . Tear of rotator  cuff 05/26/2015  . Shoulder injury 04/06/2015  . Rotator cuff syndrome 04/06/2015  . Special screening for malignant neoplasms, colon   . CAD (coronary artery disease)   . GERD (gastroesophageal reflux disease)   . Dyslipidemia   . Ejection fraction < 50%   . Hypoxia     Raymie Giammarco P, PTA 03/24/2017, 10:46 AM  [facility] [address]Phone: [phone]  Fax:  [phone] Name: CAmaar OshitaMRN: [id number] of Birth: [birth year]

## 2017-03-26 ENCOUNTER — Encounter: Payer: Self-pay | Admitting: Physical Therapy

## 2017-03-26 ENCOUNTER — Ambulatory Visit: Payer: Worker's Compensation | Admitting: Physical Therapy

## 2017-03-26 DIAGNOSIS — R262 Difficulty in walking, not elsewhere classified: Secondary | ICD-10-CM

## 2017-03-26 DIAGNOSIS — M25562 Pain in left knee: Secondary | ICD-10-CM

## 2017-03-26 DIAGNOSIS — M25662 Stiffness of left knee, not elsewhere classified: Secondary | ICD-10-CM

## 2017-03-26 NOTE — Therapy (Signed)
[facility] [place], [place], [address] Phone: [phone]   Fax:  [phone]  Physical Therapy Treatment  Patient Details  Name: Tony Patrick MRN: [id number] Date of Birth: Mar 25, 1950 Referring Provider: Dr. Gladstone Lighter   Encounter Date: 03/26/2017  PT End of Session - 03/26/17 1028    Visit Number  21    Number of Visits  24    Date for PT Re-Evaluation  03/05/17    Authorization Type  Worker's Compensation authorized 12 visits    Authorization - Visit Number  21    Authorization - Number of Visits  24    PT Start Time  [phone]    PT Stop Time  1044    PT Time Calculation (min)  58 min    Activity Tolerance  Patient tolerated treatment well    Behavior During Therapy  [facility] for tasks assessed/performed       Past Medical History:  Diagnosis Date  . Arthritis   . Atherosclerotic plaque   . Basal cell carcinoma   . CAD (coronary artery disease)    Anterior MI, [facility], 1995, no PCI, total LAD  / catheterization 2001, 30% LAD beyond the origin of a large diagonal, circumflex normal, RCA normal, anterolateral and apical  akinesis, ejection fraction 25-35% range  . Dyslipidemia   . Ejection fraction < 50%    EF 25-35%, catheterization 2001 / EF 20-25%, global hypokinesis, echo in June, 2012  . GERD (gastroesophageal reflux disease)   . History of bronchitis   . Hypoxia    Pneumonia, hospitalization, June, 2012  . kidney stones   . MI (myocardial infarction) ([place]) 1995  . Pneumonia 2012  . S/P colectomy    Benign tumor    Past Surgical History:  Procedure Laterality Date  . CARDIAC CATHETERIZATION     Patient states he has not had cardiac cath  . CHEST TUBE INSERTION    . COLECTOMY  1981   benign mass  . COLONOSCOPY WITH PROPOFOL N/A 07/04/2014   Procedure: COLONOSCOPY WITH PROPOFOL;  Surgeon: Gatha Mayer, MD;  [facility];  Service: Endoscopy;  Laterality: N/A;  . JOINT REPLACEMENT    . KNEE  ARTHROSCOPY Left 07/19/2015   Procedure: LEFT ARTHROSCOPY KNEE WITH MEDIAL MENISCECTOMY;  Surgeon: Latanya Maudlin, MD;  [facility]: [facility];  Service: Orthopedics;  Laterality: Left;  LMA  . LEG SURGERY Left    cyst  . PLEURAL SCARIFICATION  1978  . SHOULDER OPEN ROTATOR CUFF REPAIR Left 05/26/2015   Procedure: LEFT OPEN ACROMINECTOMY AND REPAIR WITH GRAFT AND ANCHOR ROTATOR CUFF TEAR ;  Surgeon: Latanya Maudlin, MD;  [facility];  Service: Orthopedics;  Laterality: Left;  Left shoulder block in holding  . TOTAL KNEE ARTHROPLASTY Left 01/29/2017   Procedure: LEFT TOTAL KNEE ARTHROPLASTY;  Surgeon: Latanya Maudlin, MD;  [facility];  Service: Orthopedics;  Laterality: Left;  Adductor Block    There were no vitals filed for this visit.  Subjective Assessment - 03/26/17 0949    Subjective  No newcomplaints upon arrival, ongoing tightness in knee    Pertinent History  01/29/17 L TKA    Limitations  Walking;Standing    How long can you stand comfortably?  With walker 30 mins    How long can you walk comfortably?  Less than 10 minutes    Diagnostic tests  MRI, X-Ray    Patient Stated Goals  Gain motion.    Currently in Pain?  Yes  Pain Score  1     Pain Location  Knee    Pain Orientation  Left    Pain Descriptors / Indicators  Tightness    Pain Type  Surgical pain    Pain Onset  More than a month ago    Aggravating Factors   ROM in knee    Pain Relieving Factors  rest                      [facility] Adult PT Treatment/Exercise - 03/26/17 0001      Knee/Hip Exercises: Aerobic   Stationary Bike  L2 x15 min (3.5 mi)      Knee/Hip Exercises: Machines for Strengthening   Cybex Knee Extension  20# 2x25 reps    Cybex Knee Flexion  40# 2x25 reps    Cybex Leg Press  3 pl, seat 5 x50 reps for ROM      Electrical Stimulation   Electrical Stimulation Location  L knee    Electrical Stimulation Action  1-10hz  IFC x70mn    Electrical Stimulation Goals  Pain;Edema       Vasopneumatic   Number Minutes Vasopneumatic   15 minutes    Vasopnuematic Location   Knee    Vasopneumatic Pressure  Medium      Manual Therapy   Manual Therapy  Myofascial release;Passive ROM    Myofascial Release  IASTW to L knee, distal quad and surrounding patella to reduce pain and adhesions    Passive ROM  manual PROM into flexion and extension with gentle overpressure to improve ROM.        Prosthetics   Prosthetic Care Comments   --               PT Short Term Goals - 03/14/17 1033      PT SHORT TERM GOAL #1   Title  Patient will improve left knee flexion AROM to 85 degrees to improve functional mobility.    Time  2    Period  Weeks    Status  Achieved AROM 88 degrees 03/03/17      PT SHORT TERM GOAL #2   Title  Patient will improve left knee extension AROM to -5 degrees to improve functional mobility.    Time  2    Period  Weeks    Status  Achieved      PT SHORT TERM GOAL #3   Title  Patient will decrease pain at rest to less than or equal to 2/10.    Time  2    Period  Weeks    Status  Achieved        PT Long Term Goals - 03/14/17 1033      PT LONG TERM GOAL #1   Title  Patient will improve Left Flexion knee AROM  to 110 degrees in order to perform functional tasks and ADLs.    Time  4    Period  Weeks    Status  On-going AROM L knee flexion 105 03/14/2017      PT LONG TERM GOAL #2   Title  Patient will improve left knee extension AROM to 0 degrees to normalize gait pattern and perform functional tasks and ADLs.    Time  4    Period  Weeks    Status  On-going -3 deg from neutral L knee 03/14/2017      PT LONG TERM GOAL #3   Title  Patient will improve left knee strength  to 5/5 for adequate knee stabilization to perform functional activities and ADLs.    Time  4    Period  Weeks    Status  Partially Met L knee extensors 5/5, L knee flexors 4/5 03/14/2017      PT LONG TERM GOAL #4   Title  Patient will ascend and descend steps with proper step over  step gait pattern with UE support in order to safely enter and exit home..    Time  4    Period  Weeks    Status  Achieved      PT LONG TERM GOAL #5   Title  Patient will ambulate with single point cane with </= 2/10 pain, normalized, step through gait pattern in order to ambulate efficiently within community.    Time  4    Period  Weeks    Status  On-going Antalgic gait noted between exercises in clinic 03/14/2017            Plan - 03/26/17 1029    Clinical Impression Statement  Patient tolerated treatment well today. Patient has overall improvement with little discomfort. Patient has ongoing stiffness yet ROM consistant and some limitations with full range. Patient going to MD F/U and will need note next treeatment. Goals ongoing.     Rehab Potential  Good    PT Frequency  2x / week    PT Duration  4 weeks    PT Treatment/Interventions  ADLs/Self Care Home Management;Cryotherapy;Electrical Stimulation;Moist Heat;Iontophoresis 31m/ml Dexamethasone;Therapeutic exercise;Therapeutic activities;Patient/family education;Neuromuscular re-education;Balance training;Manual techniques;Passive range of motion;Vasopneumatic Device    PT Next Visit Plan  Continue POC for ROM and stength (MD. Gioffre appt on 03/31/17) will need note friday    Consulted and Agree with Plan of Care  Patient       Patient will benefit from skilled therapeutic intervention in order to improve the following deficits and impairments:  Abnormal gait, Pain, Decreased mobility, Decreased range of motion, Decreased strength, Difficulty walking, Increased edema  Visit Diagnosis: Acute pain of left knee  Stiffness of left knee, not elsewhere classified  Difficulty in walking, not elsewhere classified     Problem List Patient Active Problem List   Diagnosis Date Noted  . Hx of total knee arthroplasty, right 01/29/2017  . Achilles tendinitis of right lower extremity 11/21/2016  . [place]ilated [place]  05/22/2016  . History of MI (myocardial infarction) 05/22/2016  . Benign prostatic hyperplasia with urinary frequency 05/16/2016  . Tear of rotator cuff 05/26/2015  . Shoulder injury 04/06/2015  . Rotator cuff syndrome 04/06/2015  . Special screening for malignant neoplasms, colon   . CAD (coronary artery disease)   . GERD (gastroesophageal reflux disease)   . Dyslipidemia   . Ejection fraction < 50%   . Hypoxia     Serenity Batley P, PTA 03/26/2017, 10:45 AM  [facility] [address]Phone: [phone]  Fax:  [phone] Name: CFelipe PaluchMRN: [id number] of Birth: 9April 18, 1952

## 2017-03-28 ENCOUNTER — Ambulatory Visit: Payer: Worker's Compensation | Admitting: Physical Therapy

## 2017-03-28 ENCOUNTER — Encounter: Payer: Self-pay | Admitting: Physical Therapy

## 2017-03-28 DIAGNOSIS — M25562 Pain in left knee: Secondary | ICD-10-CM | POA: Diagnosis not present

## 2017-03-28 DIAGNOSIS — M25662 Stiffness of left knee, not elsewhere classified: Secondary | ICD-10-CM

## 2017-03-28 DIAGNOSIS — R262 Difficulty in walking, not elsewhere classified: Secondary | ICD-10-CM

## 2017-03-28 NOTE — Therapy (Addendum)
[facility] [place], [place], [address] Phone: [phone]   Fax:  [phone]  Physical Therapy Treatment  Patient Details  Name: Tony Patrick MRN: [id number] Date of Birth: [birth year] Referring Provider: Dr. Gladstone Lighter   Encounter Date: 03/28/2017  PT End of Session - 03/28/17 0952    Visit Number  22    Number of Visits  24    Date for PT Re-Evaluation  03/05/17    Authorization Type  Worker's Compensation authorized 12 visits    Authorization - Visit Number  40    Authorization - Number of Visits  24    PT Start Time  [phone]    PT Stop Time  1055    PT Time Calculation (min)  64 min    Activity Tolerance  Patient tolerated treatment well    Behavior During Therapy  [facility] for tasks assessed/performed       Past Medical History:  Diagnosis Date  . Arthritis   . Atherosclerotic plaque   . Basal cell carcinoma   . CAD (coronary artery disease)    Anterior MI, [facility], 1995, no PCI, total LAD  / catheterization 2001, 30% LAD beyond the origin of a large diagonal, circumflex normal, RCA normal, anterolateral and apical  akinesis, ejection fraction 25-35% range  . Dyslipidemia   . Ejection fraction < 50%    EF 25-35%, catheterization 2001 / EF 20-25%, global hypokinesis, echo in June, 2012  . GERD (gastroesophageal reflux disease)   . History of bronchitis   . Hypoxia    Pneumonia, hospitalization, June, 2012  . kidney stones   . MI (myocardial infarction) ([place]) 1995  . Pneumonia 2012  . S/P colectomy    Benign tumor    Past Surgical History:  Procedure Laterality Date  . CARDIAC CATHETERIZATION     Patient states he has not had cardiac cath  . CHEST TUBE INSERTION    . COLECTOMY  1981   benign mass  . COLONOSCOPY WITH PROPOFOL N/A 07/04/2014   Procedure: COLONOSCOPY WITH PROPOFOL;  Surgeon: Gatha Mayer, MD;  Location: [facility];  Service: Endoscopy;  Laterality: N/A;  . JOINT REPLACEMENT    . KNEE  ARTHROSCOPY Left 07/19/2015   Procedure: LEFT ARTHROSCOPY KNEE WITH MEDIAL MENISCECTOMY;  Surgeon: Latanya Maudlin, MD;  Location: [facility];  Service: Orthopedics;  Laterality: Left;  LMA  . LEG SURGERY Left    cyst  . PLEURAL SCARIFICATION  1978  . SHOULDER OPEN ROTATOR CUFF REPAIR Left 05/26/2015   Procedure: LEFT OPEN ACROMINECTOMY AND REPAIR WITH GRAFT AND ANCHOR ROTATOR CUFF TEAR ;  Surgeon: Latanya Maudlin, MD;  Location: [facility];  Service: Orthopedics;  Laterality: Left;  Left shoulder block in holding  . TOTAL KNEE ARTHROPLASTY Left 01/29/2017   Procedure: LEFT TOTAL KNEE ARTHROPLASTY;  Surgeon: Latanya Maudlin, MD;  Location: [facility];  Service: Orthopedics;  Laterality: Left;  Adductor Block    There were no vitals filed for this visit.  Subjective Assessment - 03/28/17 0948    Subjective  No complaints upon arrival although he reported pain upon beginning on stationary bike.    Patient is accompained by:  Family member Daughter    Pertinent History  01/29/17 L TKA    Limitations  Walking;Standing    How long can you stand comfortably?  With walker 30 mins    How long can you walk comfortably?  Less than 10 minutes    Diagnostic tests  MRI, X-Ray  Patient Stated Goals  Gain motion.    Currently in Pain?  No/denies         [facility] PT Assessment - 03/28/17 0001      Assessment   Medical Diagnosis  Left total knee replacement    Onset Date/Surgical Date  01/29/17    Next MD Visit  03/31/2017      Observation/Other Assessments-Edema    Edema  Circumferential      Circumferential Edema   Circumferential - Right  43.2 cm    Circumferential - Left   44.9 cm      ROM / Strength   AROM / PROM / Strength  AROM;Strength      AROM   Overall AROM   Deficits    AROM Assessment Site  Knee    Right/Left Knee  Left    Left Knee Extension  -3    Left Knee Flexion  106      Strength   Overall Strength  Within functional limits for tasks performed    Strength Assessment Site  Knee     Right/Left Knee  Left    Left Knee Flexion  4/5 painful along B     Left Knee Extension  5/5                  [facility] Adult PT Treatment/Exercise - 03/28/17 0001      Knee/Hip Exercises: Aerobic   Stationary Bike  L2 x15 min       Knee/Hip Exercises: Machines for Strengthening   Cybex Knee Extension  20# 2x25 reps    Cybex Knee Flexion  40# 2x25 reps    Cybex Leg Press  3 pl, seat 7 x30 reps       Modalities   Modalities  Vasopneumatic      Vasopneumatic   Number Minutes Vasopneumatic   15 minutes    Vasopnuematic Location   Knee    Vasopneumatic Pressure  Medium    Vasopneumatic Temperature   34      Manual Therapy   Manual Therapy  Myofascial release    Myofascial Release  IASTW to L knee, distal quad and surrounding patella to reduce pain and adhesions               PT Short Term Goals - 03/14/17 1033      PT SHORT TERM GOAL #1   Title  Patient will improve left knee flexion AROM to 85 degrees to improve functional mobility.    Time  2    Period  Weeks    Status  Achieved AROM 88 degrees 03/03/17      PT SHORT TERM GOAL #2   Title  Patient will improve left knee extension AROM to -5 degrees to improve functional mobility.    Time  2    Period  Weeks    Status  Achieved      PT SHORT TERM GOAL #3   Title  Patient will decrease pain at rest to less than or equal to 2/10.    Time  2    Period  Weeks    Status  Achieved        PT Long Term Goals - 03/14/17 1033      PT LONG TERM GOAL #1   Title  Patient will improve Left Flexion knee AROM  to 110 degrees in order to perform functional tasks and ADLs.    Time  4    Period  Weeks  Status  On-going AROM L knee flexion 105 03/14/2017      PT LONG TERM GOAL #2   Title  Patient will improve left knee extension AROM to 0 degrees to normalize gait pattern and perform functional tasks and ADLs.    Time  4    Period  Weeks    Status  On-going -3 deg from neutral L knee 03/14/2017      PT LONG TERM  GOAL #3   Title  Patient will improve left knee strength to 5/5 for adequate knee stabilization to perform functional activities and ADLs.    Time  4    Period  Weeks    Status  Partially Met L knee extensors 5/5, L knee flexors 4/5 03/14/2017      PT LONG TERM GOAL #4   Title  Patient will ascend and descend steps with proper step over step gait pattern with UE support in order to safely enter and exit home..    Time  4    Period  Weeks    Status  Achieved      PT LONG TERM GOAL #5   Title  Patient will ambulate with single point cane with </= 2/10 pain, normalized, step through gait pattern in order to ambulate efficiently within community.    Time  4    Period  Weeks    Status  On-going Antalgic gait noted between exercises in clinic 03/14/2017            Plan - 03/28/17 1041    Clinical Impression Statement  Patient tolerated today's treatment well as he is able to progress reps with machine strengthening. Patient arrived to treatment with reports of tightness in L knee which may be due to increased edema observed in L knee. L knee edema 1.7 cm greater than R knee. Patient reported L knee tightness with L knee ROM measurement. IASTW completed again to L knee and surrounding musculature to reduce adhesions and muscle tightness. Good redness response to L knee IASTW observed today. Patient progressing with L knee ROM and strength as well as gait but antalgic gait done at times per patient with increased pain. Patient weaker in L HS per MMT results. Normal modalities response noted following removal of the modalities.     Rehab Potential  Good    PT Frequency  2x / week    PT Duration  4 weeks    PT Treatment/Interventions  ADLs/Self Care Home Management;Cryotherapy;Electrical Stimulation;Moist Heat;Iontophoresis 1m/ml Dexamethasone;Therapeutic exercise;Therapeutic activities;Patient/family education;Neuromuscular re-education;Balance training;Manual techniques;Passive range of  motion;Vasopneumatic Device    PT Next Visit Plan  Continue POC for ROM and stength (MD. Gioffre appt on 03/31/17)     Consulted and Agree with Plan of Care  Patient    Family Member Consulted  Daughter       Patient will benefit from skilled therapeutic intervention in order to improve the following deficits and impairments:  Abnormal gait, Pain, Decreased mobility, Decreased range of motion, Decreased strength, Difficulty walking, Increased edema  Visit Diagnosis: Acute pain of left knee  Stiffness of left knee, not elsewhere classified  Difficulty in walking, not elsewhere classified     Problem List Patient Active Problem List   Diagnosis Date Noted  . Hx of total knee arthroplasty, right 01/29/2017  . Achilles tendinitis of right lower extremity 11/21/2016  . Dilated cardiomyopathy [place] 05/22/2016  . History of MI (myocardial infarction) 05/22/2016  . Benign prostatic hyperplasia with urinary frequency 05/16/2016  . Tear  of rotator cuff 05/26/2015  . Shoulder injury 04/06/2015  . Rotator cuff syndrome 04/06/2015  . Special screening for malignant neoplasms, colon   . CAD (coronary artery disease)   . GERD (gastroesophageal reflux disease)   . Dyslipidemia   . Ejection fraction < 50%   . Hypoxia    PHYSICAL THERAPY DISCHARGE SUMMARY  Visits from Start of Care: 22  Current functional level related to goals / functional outcomes: See above   Remaining deficits: See goals   Education / Equipment: HEP  Plan: Patient agrees to discharge.  Patient goals were partially met. Patient is being discharged due to not returning since the last visit.  ?????      Standley Brooking, [place] 03/28/17 10:58 AM   [facility] [address] [place], [place], [address] Phone: [phone]   Fax:  [phone]  Name: Tony Patrick MRN: [id number] Date of Birth: [birth year]

## 2017-04-21 ENCOUNTER — Other Ambulatory Visit: Payer: Self-pay | Admitting: Physician Assistant

## 2017-04-28 ENCOUNTER — Other Ambulatory Visit: Payer: Self-pay | Admitting: Physician Assistant

## 2017-05-31 ENCOUNTER — Other Ambulatory Visit: Payer: Self-pay | Admitting: Physician Assistant

## 2017-06-11 ENCOUNTER — Ambulatory Visit: Payer: Self-pay | Admitting: Physician Assistant

## 2017-06-23 DIAGNOSIS — N5201 Erectile dysfunction due to arterial insufficiency: Secondary | ICD-10-CM | POA: Diagnosis not present

## 2017-06-23 DIAGNOSIS — R972 Elevated prostate specific antigen [PSA]: Secondary | ICD-10-CM | POA: Diagnosis not present

## 2017-06-23 DIAGNOSIS — R3915 Urgency of urination: Secondary | ICD-10-CM | POA: Diagnosis not present

## 2017-06-26 ENCOUNTER — Ambulatory Visit (INDEPENDENT_AMBULATORY_CARE_PROVIDER_SITE_OTHER): Payer: Medicare Other | Admitting: Cardiology

## 2017-06-26 ENCOUNTER — Encounter: Payer: Self-pay | Admitting: Cardiology

## 2017-06-26 VITALS — BP 128/72 | HR 67 | Ht 69.0 in | Wt 243.0 lb

## 2017-06-26 DIAGNOSIS — I255 Ischemic cardiomyopathy: Secondary | ICD-10-CM | POA: Diagnosis not present

## 2017-06-26 DIAGNOSIS — I2510 Atherosclerotic heart disease of native coronary artery without angina pectoris: Secondary | ICD-10-CM | POA: Diagnosis not present

## 2017-06-26 DIAGNOSIS — E785 Hyperlipidemia, unspecified: Secondary | ICD-10-CM

## 2017-06-26 DIAGNOSIS — I252 Old myocardial infarction: Secondary | ICD-10-CM

## 2017-06-26 DIAGNOSIS — I5022 Chronic systolic (congestive) heart failure: Secondary | ICD-10-CM

## 2017-06-26 DIAGNOSIS — I420 Dilated cardiomyopathy: Secondary | ICD-10-CM

## 2017-06-26 MED ORDER — SACUBITRIL-VALSARTAN 49-51 MG PO TABS: 1.0000 | ORAL_TABLET | Freq: Two times a day (BID) | ORAL | 6 refills | Status: DC

## 2017-06-26 NOTE — Patient Instructions (Addendum)
Medication Instructions:  Please discontinue Enalapril for 36 hours then start Entresto  49/51 mg one tablet twice a day. Continue all other medications as listed.  Follow-Up: Follow up in approximately 1-2 months with PA/NP.  If you need a refill on your cardiac medications before your next appointment, please call your pharmacy.  Thank you for choosing [facility]!!

## 2017-06-26 NOTE — Progress Notes (Signed)
Cardiology Office Note:    Date:  06/26/2017   ID:  Tony Patrick, DOB [birth year], MRN [id number]  PCP:  Terald Sleeper, PA-C  Cardiologist:  Candee Furbish, MD    Referring MD: Terald Sleeper, PA-C   Here for follow-up history of ischemic cardiomyopathy  History of Present Illness:    Tony Patrick is a 67 y.o. male with a hx of Coronary artery disease status post anterior myocardial infarction in 1995 with Subsequent cardiac catheterization in 2001, see below with ejection fraction of 25% at that time, currently 30-35%, did not wish to proceed with ICD here for follow-up.  Multiple discussions about ICD in the past, not interested. He states that his first heart attack was in 1995 and he has lived a long life since then.  Back in 2012 nuclear stress test was recommended but he did not wish to have this done.  He is still working with the Therapist, nutritional periodically. His orthopedic issues predominate.  06/26/2017-since last visit had total left knee arthroplasty on 01/29/2017.  Had rehabilitation postoperatively.  Past Medical History:  Diagnosis Date  . Arthritis   . Atherosclerotic plaque   . Basal cell carcinoma   . CAD (coronary artery disease)    Anterior MI, [facility], 1995, no PCI, total LAD  / catheterization 2001, 30% LAD beyond the origin of a large diagonal, circumflex normal, RCA normal, anterolateral and apical  akinesis, ejection fraction 25-35% range  . Dyslipidemia   . Ejection fraction < 50%    EF 25-35%, catheterization 2001 / EF 20-25%, global hypokinesis, echo in June, 2012  . GERD (gastroesophageal reflux disease)   . History of bronchitis   . Hypoxia    Pneumonia, hospitalization, June, 2012  . kidney stones   . MI (myocardial infarction) ([place]) 1995  . Pneumonia 2012  . S/P colectomy    Benign tumor    Past Surgical History:  Procedure Laterality Date  . CARDIAC CATHETERIZATION     Patient states he has not had cardiac cath  . CHEST TUBE  INSERTION    . COLECTOMY  1981   benign mass  . COLONOSCOPY WITH PROPOFOL N/A 07/04/2014   Procedure: COLONOSCOPY WITH PROPOFOL;  Surgeon: Gatha Mayer, MD;  [facility];  Service: Endoscopy;  Laterality: N/A;  . JOINT REPLACEMENT    . KNEE ARTHROSCOPY Left 07/19/2015   Procedure: LEFT ARTHROSCOPY KNEE WITH MEDIAL MENISCECTOMY;  Surgeon: Latanya Maudlin, MD;  Location: [facility];  Service: Orthopedics;  Laterality: Left;  LMA  . LEG SURGERY Left    cyst  . PLEURAL SCARIFICATION  1978  . SHOULDER OPEN ROTATOR CUFF REPAIR Left 05/26/2015   Procedure: LEFT OPEN ACROMINECTOMY AND REPAIR WITH GRAFT AND ANCHOR ROTATOR CUFF TEAR ;  Surgeon: Latanya Maudlin, MD;  [facility];  Service: Orthopedics;  Laterality: Left;  Left shoulder block in holding  . TOTAL KNEE ARTHROPLASTY Left 01/29/2017   Procedure: LEFT TOTAL KNEE ARTHROPLASTY;  Surgeon: Latanya Maudlin, MD;  [facility];  Service: Orthopedics;  Laterality: Left;  Adductor Block    Current Medications: Current Meds  Medication Sig  . albuterol (PROVENTIL HFA;VENTOLIN HFA) 108 (90 Base) MCG/ACT inhaler Inhale 1-2 puffs into the lungs every 6 (six) hours as needed for wheezing.  Marland Kitchen aspirin 81 MG chewable tablet Chew 81 mg by mouth daily.  Marland Kitchen azithromycin (ZITHROMAX Z-PAK) 250 MG tablet Take as directed  . carvedilol (COREG) 25 MG tablet TAKE 1 TABLET TWICE A DAY  .  cetirizine (ZYRTEC) 10 MG tablet TAKE 1 TABLET DAILY  . diclofenac sodium (VOLTAREN) 1 % GEL Apply 1 application topically 3 (three) times daily as needed (for hands).  . finasteride (PROSCAR) 5 MG tablet Take 5 mg by mouth daily.   . fluticasone (FLONASE) 50 MCG/ACT nasal spray Place 2 sprays into both nostrils daily. (Patient taking differently: Place 2 sprays into both nostrils at bedtime as needed for allergies. )  . ipratropium-albuterol (DUONEB) 0.5-2.5 (3) MG/3ML SOLN Take 3 mLs by nebulization every 6 (six) hours as needed (shortness of breath).  Marland Kitchen omeprazole  (PRILOSEC) 20 MG capsule TAKE 1 CAPSULE DAILY  . simvastatin (ZOCOR) 40 MG tablet TAKE 1 TABLET EVERY EVENING  . tamsulosin (FLOMAX) 0.4 MG CAPS capsule TAKE 1 CAPSULE 30 MINUTES AFTER THE SAMEMEAL EACH DAY  . [DISCONTINUED] enalapril (VASOTEC) 20 MG tablet TAKE ONE AND ONE-HALF TABLETS TWICE A DAY     Allergies:   Ivp dye [iodinated diagnostic agents] and Morphine and related   Social History   Socioeconomic History  . Marital status: Married    Spouse name: Not on file  . Number of children: 1  . Years of education: Not on file  . Highest education level: Not on file  Occupational History  . Occupation: retired  Scientific laboratory technician  . Financial resource strain: Not on file  . Food insecurity:    Worry: Not on file    Inability: Not on file  . Transportation needs:    Medical: Not on file    Non-medical: Not on file  Tobacco Use  . Smoking status: Former Smoker    Packs/day: 2.00    Years: 32.00    Pack years: 64.00    Types: Cigarettes    Last attempt to quit: 04/14/1992    Years since quitting: 25.2  . Smokeless tobacco: Never Used  Substance and Sexual Activity  . Alcohol use: No  . Drug use: No  . Sexual activity: Not on file  Lifestyle  . Physical activity:    Days per week: Not on file    Minutes per session: Not on file  . Stress: Not on file  Relationships  . Social connections:    Talks on phone: Not on file    Gets together: Not on file    Attends religious service: Not on file    Active member of club or organization: Not on file    Attends meetings of clubs or organizations: Not on file    Relationship status: Not on file  Other Topics Concern  . Not on file  Social History Narrative   Lives with wife   Retired [place] [employer] (41 yrs)   Museum/gallery conservator     Family History: [name] family history includes Cancer in his brother and maternal grandmother; Diabetes in his mother; Hypertension in his mother; Melanoma in his brother; Prostatitis in his  brother. ROS:   Please see the history of present illness.    All other ROS negative  EKGs/Labs/Other Studies Reviewed:    The following studies were reviewed today: ECHO 05/25/15: - Left ventricle: The cavity size was severely dilated. Wall   thickness was increased in a pattern of mild LVH. Systolic   function was moderately to severely reduced. The estimated   ejection fraction was in the range of 30% to 35%. Predominant   anterior, apical and inferoapical akinesis suggestive of LAD   infarct. Doppler parameters are consistent with abnormal left   ventricular relaxation (grade  1 diastolic dysfunction). The E/e&'   ratio is >15, suggesting elevated LV filling pressure. - Left atrium: The atrium was mildly dilated. - Right ventricle: The cavity size was mildly dilated. Systolic   function was normal. - Inferior vena cava: The vessel was normal in size. The   respirophasic diameter changes were in the normal range (= 50%),   consistent with normal central venous pressure.  Impressions:  - Compared to a prior study in 2012, the LVEF has improved to   30-35% with predominantly LAD territory wall motion   abnormalities.  EKG:  EKG is  ordered today.  The ekg ordered today 06/26/2017- heart rate 67 sinus rhythm left bundle branch block-like appearance .05/22/16 shows sinus rhythm 65 with left bundle branch block like appearance, nonspecific ST-T wave changes personally viewed. No significant change from prior personally viewed.  Recent Labs: 01/20/2017: ALT 16 01/31/2017: BUN 16; Creatinine, Ser 0.87; Hemoglobin 12.9; Platelets 162; Potassium 4.0; Sodium 138   Recent Lipid Panel    Component Value Date/Time   CHOL 140 05/20/2016 1029   TRIG 116 05/20/2016 1029   HDL 40 05/20/2016 1029   CHOLHDL 3.5 05/20/2016 1029   LDLCALC 77 05/20/2016 1029    Physical Exam:    VS:  BP 128/72 (BP Location: Left Arm, Patient Position: Sitting, Cuff Size: Normal)   Pulse 67   Ht 5\' 9"  (1.753  m)   Wt 243 lb (110.2 kg)   SpO2 94%   BMI 35.88 kg/m     Wt Readings from Last 3 Encounters:  06/26/17 243 lb (110.2 kg)  03/13/17 247 lb 12.8 oz (112.4 kg)  01/29/17 246 lb (111.6 kg)     GEN: Well nourished, well developed, in no acute distress , obese HEENT: normal  Neck: no JVD, carotid bruits, or masses Cardiac: RRR; no murmurs, rubs, or gallops,trace edema  Respiratory:  clear to auscultation bilaterally, normal work of breathing GI: soft, nontender, nondistended, + BS MS: no deformity or atrophy  Skin: warm and dry, no rash Neuro:  Alert and Oriented x 3, Strength and sensation are intact Psych: euthymic mood, full affect   ASSESSMENT:    1. Dilated cardiomyopathy ([place])   2. Coronary artery disease involving native coronary artery of native heart without angina pectoris   3. History of MI (myocardial infarction)   4. Dyslipidemia   5. Cardiomyopathy, ischemic   6. Chronic systolic heart failure (HCC)    PLAN:    In order of problems listed above:  Coronary artery disease  - Old anterior MI many years ago in 1995, no PCI, total LAD occlusion noted on 2001 heart catheterization and 30% LAD beyond the origin of a large diagonal branch, circumflex was normal, RCA was normal. EF in the 30-35% range.  - Continue with secondary prevention, goal-directed therapy, beta blocker,  statin, aspirin.  We will go ahead and change him over to [facility] mid dose.  Stop enalapril for 36 hours prior to administration.  Overall doing quite well.  Essential hypertension  -Medications reviewed, has been mildly elevated in the past.  Starting Entresto  Ischemic cardiomyopathy  - Not interested in ICD. Many discussions. Understands risk of sudden death. He states I have lived this long since my heart attack in 1995.  Chronic systolic heart failure -EF 30 to 35%.  Has been on both enalapril and carvedilol for quite some time.  - Stop to drag trash can.  Has been's dealing with some  shortness of breath.  This has improved with weight loss.  Activity.  We will start Entresto.  Morbid Obesity  -Continue to try to decrease carbohydrates, weight loss.  - We discussed at length today. Wife present. She has type 2 diabetes.  - BMI greater than 35 with multiple comorbidities. Done well recently with loss.   Hyperlipidemia  - Continue statin therapy.  No changes made.  I would like for him to follow-up in 1 month with APP to see how the medication is doing.  Likely check basic metabolic profile at that time.  Medication Adjustments/Labs and Tests Ordered: Current medicines are reviewed at length with the patient today.  Concerns regarding medicines are outlined above. Labs and tests ordered and medication changes are outlined in the patient instructions below:  Patient Instructions  Medication Instructions:  Please discontinue Enalapril for 36 hours then start Entresto  49/51 mg one tablet twice a day. Continue all other medications as listed.  Follow-Up: Follow up in approximately 1-2 months with PA/NP.  If you need a refill on your cardiac medications before your next appointment, please call your pharmacy.  Thank you for choosing [facility]!!        Signed, Candee Furbish, MD  06/26/2017 10:08 AM    [place]

## 2017-07-01 ENCOUNTER — Telehealth: Payer: Self-pay

## 2017-07-01 NOTE — Telephone Encounter (Signed)
**Note De-Identified Tony Patrick Obfuscation** I have done an [place] PA through covermymeds and immediately received the following message:  Message from Plan CaseId:[id number];Status:Approved;Review Type:Prior Auth;Coverage Start Date:06/01/2017;Coverage End Date:01/13/2098;   Key:[id number]

## 2017-07-09 ENCOUNTER — Ambulatory Visit: Payer: Self-pay

## 2017-07-18 ENCOUNTER — Other Ambulatory Visit: Payer: Self-pay | Admitting: *Deleted

## 2017-07-18 DIAGNOSIS — J208 Acute bronchitis due to other specified organisms: Secondary | ICD-10-CM

## 2017-07-18 MED ORDER — FLUTICASONE PROPIONATE 50 MCG/ACT NA SUSP: 2.0000 | Freq: Every day | NASAL | 2 refills | Status: DC

## 2017-07-24 ENCOUNTER — Encounter: Payer: Self-pay | Admitting: Cardiology

## 2017-08-06 ENCOUNTER — Other Ambulatory Visit: Payer: Self-pay | Admitting: Cardiology

## 2017-08-06 MED ORDER — SACUBITRIL-VALSARTAN 49-51 MG PO TABS: 1.0000 | ORAL_TABLET | Freq: Two times a day (BID) | ORAL | 3 refills | Status: DC

## 2017-08-06 NOTE — Telephone Encounter (Signed)
Pt's medication was sent to pt's pharmacy as requested. Confirmation received.  °

## 2017-08-11 NOTE — Progress Notes (Signed)
Cardiology Office Note   Date:  08/12/2017   ID:  Tony Patrick, DOB [birth year], MRN [id number]  PCP:  Terald Sleeper, PA-C  Cardiologist:  Dr. Marlou Porch    Chief Complaint  Patient presents with  . Coronary Artery Disease  . Cardiomyopathy      History of Present Illness: Tony Patrick is a 67 y.o. male who presents for medication titration.   Pt with hx of Coronary artery disease status post anterior myocardial infarction in 1995 with Subsequent cardiac catheterization in 2001, see below with ejection fraction of 25% at that time, currently 30-35%, did not wish to proceed with ICD here for follow-up.  Multiple discussions about ICD in the past, not interested. He states that his first heart attack was in 1995 and he has lived a long life since then.  Back in 2012 nuclear stress test was recommended but he did not wish to have this done.  He is still working with the Therapist, nutritional periodically. His orthopedic issues predominate.  06/26/2017-since last visit had total left knee arthroplasty on 01/29/2017.  Had rehabilitation postoperatively.  Now on entresto for ICM EF 30-35%  Pt has refused ICD.   Today  He feels no different on entresto.  But no chest pain or SOB.  He is active mowing grass yard work and working part time. Watches his diet no addition of salt.    Past Medical History:  Diagnosis Date  . Arthritis   . Atherosclerotic plaque   . Basal cell carcinoma   . CAD (coronary artery disease)    Anterior MI, [facility], 1995, no PCI, total LAD  / catheterization 2001, 30% LAD beyond the origin of a large diagonal, circumflex normal, RCA normal, anterolateral and apical  akinesis, ejection fraction 25-35% range  . Dyslipidemia   . Ejection fraction < 50%    EF 25-35%, catheterization 2001 / EF 20-25%, global hypokinesis, echo in June, 2012  . GERD (gastroesophageal reflux disease)   . History of bronchitis   . Hypoxia    Pneumonia, hospitalization, June,  2012  . kidney stones   . MI (myocardial infarction) ([place]) 1995  . Pneumonia 2012  . S/P colectomy    Benign tumor    Past Surgical History:  Procedure Laterality Date  . CARDIAC CATHETERIZATION     Patient states he has not had cardiac cath  . CHEST TUBE INSERTION    . COLECTOMY  1981   benign mass  . COLONOSCOPY WITH PROPOFOL N/A 07/04/2014   Procedure: COLONOSCOPY WITH PROPOFOL;  Surgeon: Gatha Mayer, MD;  Location: [facility];  Service: Endoscopy;  Laterality: N/A;  . JOINT REPLACEMENT    . KNEE ARTHROSCOPY Left 07/19/2015   Procedure: LEFT ARTHROSCOPY KNEE WITH MEDIAL MENISCECTOMY;  Surgeon: Latanya Maudlin, MD;  Location: [facility];  Service: Orthopedics;  Laterality: Left;  LMA  . LEG SURGERY Left    cyst  . PLEURAL SCARIFICATION  1978  . SHOULDER OPEN ROTATOR CUFF REPAIR Left 05/26/2015   Procedure: LEFT OPEN ACROMINECTOMY AND REPAIR WITH GRAFT AND ANCHOR ROTATOR CUFF TEAR ;  Surgeon: Latanya Maudlin, MD;  Location: [facility];  Service: Orthopedics;  Laterality: Left;  Left shoulder block in holding  . TOTAL KNEE ARTHROPLASTY Left 01/29/2017   Procedure: LEFT TOTAL KNEE ARTHROPLASTY;  Surgeon: Latanya Maudlin, MD;  [facility]: [facility];  Service: Orthopedics;  Laterality: Left;  Adductor Block     Current Outpatient Medications  Medication Sig Dispense Refill  . albuterol (PROVENTIL HFA;VENTOLIN  HFA) 108 (90 Base) MCG/ACT inhaler Inhale 1-2 puffs into the lungs every 6 (six) hours as needed for wheezing. 1 Inhaler 0  . aspirin 81 MG chewable tablet Chew 81 mg by mouth daily.    Marland Kitchen azithromycin (ZITHROMAX Z-PAK) 250 MG tablet Take as directed 6 each 0  . carvedilol (COREG) 25 MG tablet TAKE 1 TABLET TWICE A DAY 180 tablet 3  . cetirizine (ZYRTEC) 10 MG tablet TAKE 1 TABLET DAILY 90 tablet 0  . diclofenac sodium (VOLTAREN) 1 % GEL Apply 1 application topically 3 (three) times daily as needed (for hands). 1200 g 3  . finasteride (PROSCAR) 5 MG tablet Take 5 mg by mouth daily.     .  fluticasone (FLONASE) 50 MCG/ACT nasal spray Place 2 sprays into both nostrils daily. 48 g 2  . ipratropium-albuterol (DUONEB) 0.5-2.5 (3) MG/3ML SOLN Take 3 mLs by nebulization every 6 (six) hours as needed (shortness of breath).    Marland Kitchen omeprazole (PRILOSEC) 20 MG capsule TAKE 1 CAPSULE DAILY 90 capsule 0  . simvastatin (ZOCOR) 40 MG tablet TAKE 1 TABLET EVERY EVENING 90 tablet 3  . tamsulosin (FLOMAX) 0.4 MG CAPS capsule TAKE 1 CAPSULE 30 MINUTES AFTER THE SAMEMEAL EACH DAY 90 capsule 1  . sacubitril-valsartan (ENTRESTO) 97-103 MG Take 1 tablet by mouth 2 (two) times daily. 60 tablet 0   No current facility-administered medications for this visit.    No longer on Z pak Allergies:   Ivp dye [iodinated diagnostic agents] and Morphine and related    Social History:  The patient  reports that he quit smoking about 25 years ago. His smoking use included cigarettes. He has a 64.00 pack-year smoking history. He has never used smokeless tobacco. He reports that he does not drink alcohol or use drugs.   Family History:  [name] family history includes Cancer in his brother and maternal grandmother; Diabetes in his mother; Hypertension in his mother; Melanoma in his brother; Prostatitis in his brother.    ROS:  General:no colds or fevers, no weight changes Skin:no rashes or ulcers HEENT:no blurred vision, no congestion CV:see HPI PUL:see HPI GI:no diarrhea constipation or melena, no indigestion GU:no hematuria, no dysuria MS:no joint pain, no claudication Neuro:no syncope, no lightheadedness Endo:no diabetes, no thyroid disease  Wt Readings from Last 3 Encounters:  08/12/17 242 lb (109.8 kg)  06/26/17 243 lb (110.2 kg)  03/13/17 247 lb 12.8 oz (112.4 kg)     PHYSICAL EXAM: VS:  BP (!) 150/78   Pulse 62   Ht 5\' 9"  (1.753 m)   Wt 242 lb (109.8 kg)   SpO2 95%   BMI 35.74 kg/m  , BMI Body mass index is 35.74 kg/m. General:Pleasant affect, NAD Skin:Warm and dry, brisk capillary  refill HEENT:normocephalic, sclera clear, mucus membranes moist Neck:supple, no JVD, no bruits  Heart:S1S2 RRR without murmur, gallup, rub or click Lungs:clear without rales, rhonchi, or wheezes XTG:GYIR, non tender, + BS, do not palpate liver spleen or masses Ext:no lower ext edema, 2+ pedal pulses, 2+ radial pulses Neuro:alert and oriented X 3, MAE, follows commands, + facial symmetry    EKG:  EKG is NOT ordered today.   Recent Labs: 01/20/2017: ALT 16 01/31/2017: BUN 16; Creatinine, Ser 0.87; Hemoglobin 12.9; Platelets 162; Potassium 4.0; Sodium 138    Lipid Panel    Component Value Date/Time   CHOL 140 05/20/2016 1029   TRIG 116 05/20/2016 1029   HDL 40 05/20/2016 1029   CHOLHDL 3.5 05/20/2016 1029  [place] 77 05/20/2016 1029       Other studies Reviewed: Additional studies/ records that were reviewed today include: . Study Conclusions  - Left ventricle: The cavity size was severely dilated. Wall   thickness was increased in a pattern of mild LVH. Systolic   function was moderately to severely reduced. The estimated   ejection fraction was in the range of 30% to 35%. Predominant   anterior, apical and inferoapical akinesis suggestive of LAD   infarct. Doppler parameters are consistent with abnormal left   ventricular relaxation (grade 1 diastolic dysfunction). The E/e&'   ratio is >15, suggesting elevated LV filling pressure. - Left atrium: The atrium was mildly dilated. - Right ventricle: The cavity size was mildly dilated. Systolic   function was normal. - Inferior vena cava: The vessel was normal in size. The   respirophasic diameter changes were in the normal range (= 50%),   consistent with normal central venous pressure.  Impressions:  - Compared to a prior study in 2012, the LVEF has improved to   30-35% with predominantly LAD territory wall motion   abnormalities.   Cardiac Cath 05/30/10 ANGIOGRAPHIC DATA:  The left main coronary artery is free  of significant disease.  The left anterior descending artery courses to the apex.  Beyond the origin of the large diagonal branch there is narrowing that measures about 30% in luminal diameter.  This does not appear to be high-grade.  The LAD courses to the apex and wraps the apical tip.  There was a large diagonal branch with minimal ostial irregularity but no high-grade lesions.  There are smaller distal diagonal branches.  The circumflex coronary provides a small first marginal branch that is insignificant.  The circumflex consists predominately of a very large marginal branch that courses out over the anterolateral wall and bifurcates distally and is free of critical disease.  The AV circumflex is small and provides an atrial circumflex branch.  The right coronary artery is a large dominant vessel.  The right coronary artery provides a posterior descending branch which bifurcates and five posterolateral branches.  The distal right coronary demonstrates no high-grade lesions.  LEFT VENTRICULOGRAPHY:  Ventriculography in the RAO projection reveals anterolateral and apical akinesis.  The inferobasal and superior basal segments appear to move.  Ejection fraction was calculated at 25% but visually was more in the range of 35%.  No significant mitral regurgitation was noted.  CONCLUSIONS: 1. Moderately severe reduction in global left ventricular function. 2. No evidence of re-stenosis in the left anterior descending artery. 3. Continued patency of the circumflex and right coronary arteries.  DISPOSITION:  The patient is already on an ACE inhibitor, beta blockers and cholesterol lowering agent.  He is also on aspirin.  Protonix has been added to his regimen because of a history of GERD.  A GI consult will be obtained. I have called Dr. Mallie Mussel and also discussed the case with Roque Cash. DD:  06/12/99 TD:  06/13/99 Job: [id number]  ASSESSMENT AND PLAN:  1.  Dilated CM  with addition of entresto, will increase to 97/103 and recheck labs and BP in 2-3 weeks.  Will check BMP today as well.   2.   CAD without angina  3.   Hx of MI no chest pain  4.   HLD  Continue statin.   Current medicines are reviewed with the patient today.  The patient Has no concerns regarding medicines.  The following changes have been made:  See  above Labs/ tests ordered today include:see above  Disposition:   FU:  see above  Signed, Cecilie Kicks, NP  08/12/2017 10:27 AM    [facility], [place], [place] Phone: [phone]; Fax: [phone]

## 2017-08-12 ENCOUNTER — Encounter: Payer: Self-pay | Admitting: Cardiology

## 2017-08-12 ENCOUNTER — Ambulatory Visit (INDEPENDENT_AMBULATORY_CARE_PROVIDER_SITE_OTHER): Payer: Medicare Other | Admitting: Cardiology

## 2017-08-12 VITALS — BP 150/78 | HR 62 | Ht 69.0 in | Wt 242.0 lb

## 2017-08-12 DIAGNOSIS — I420 Dilated cardiomyopathy: Secondary | ICD-10-CM

## 2017-08-12 DIAGNOSIS — I2510 Atherosclerotic heart disease of native coronary artery without angina pectoris: Secondary | ICD-10-CM | POA: Diagnosis not present

## 2017-08-12 DIAGNOSIS — E785 Hyperlipidemia, unspecified: Secondary | ICD-10-CM

## 2017-08-12 DIAGNOSIS — I252 Old myocardial infarction: Secondary | ICD-10-CM

## 2017-08-12 DIAGNOSIS — I255 Ischemic cardiomyopathy: Secondary | ICD-10-CM | POA: Diagnosis not present

## 2017-08-12 DIAGNOSIS — Z79899 Other long term (current) drug therapy: Secondary | ICD-10-CM | POA: Diagnosis not present

## 2017-08-12 LAB — BASIC METABOLIC PANEL
BUN/Creatinine Ratio: 16 (ref 10–24)
BUN: 16 mg/dL (ref 8–27)
CO2: 22 mmol/L (ref 20–29)
Calcium: 9 mg/dL (ref 8.6–10.2)
Chloride: 109 mmol/L — ABNORMAL HIGH (ref 96–106)
Creatinine, Ser: 1 mg/dL (ref 0.76–1.27)
GFR calc Af Amer: 89 mL/min/{1.73_m2} (ref >59)
GFR calc non Af Amer: 77 mL/min/{1.73_m2} (ref >59)
GLUCOSE: 105 mg/dL — AB (ref 65–99)
Potassium: 3.9 mmol/L (ref 3.5–5.2)
SODIUM: 145 mmol/L — AB (ref 134–144)

## 2017-08-12 MED ORDER — SACUBITRIL-VALSARTAN 97-103 MG PO TABS: 1.0000 | ORAL_TABLET | Freq: Two times a day (BID) | ORAL | 0 refills | Status: DC

## 2017-08-12 NOTE — Patient Instructions (Signed)
Medication Instructions:  Your physician has recommended you make the following change in your medication: 1.  INCREASE the Entresto to 97/103 taking 1 tablet by mouth twice a day (YOU MAY DOUBLE UP ON THE ENTRESTO YOU HAVE NOW TAKING 2 TABLETS TWICE A DAY)   Labwork: TODAY:  BMET  Testing/Procedures: None ordered  Follow-Up: Your physician recommends that you schedule a follow-up appointment in: 09/04/17 ARRIVE AT 11:15 TO SEE Cecilie Kicks, NP   Any Other Special Instructions Will Be Listed Below (If Applicable).     If you need a refill on your cardiac medications before your next appointment, please call your pharmacy.

## 2017-08-13 ENCOUNTER — Other Ambulatory Visit: Payer: Self-pay | Admitting: *Deleted

## 2017-08-13 DIAGNOSIS — I420 Dilated cardiomyopathy: Secondary | ICD-10-CM

## 2017-08-13 DIAGNOSIS — Z79899 Other long term (current) drug therapy: Secondary | ICD-10-CM

## 2017-08-25 ENCOUNTER — Other Ambulatory Visit: Payer: Medicare Other | Admitting: *Deleted

## 2017-08-25 DIAGNOSIS — I420 Dilated cardiomyopathy: Secondary | ICD-10-CM

## 2017-08-25 DIAGNOSIS — Z79899 Other long term (current) drug therapy: Secondary | ICD-10-CM

## 2017-08-26 LAB — BASIC METABOLIC PANEL
BUN / CREAT RATIO: 28 — AB (ref 10–24)
BUN: 24 mg/dL (ref 8–27)
CO2: 20 mmol/L (ref 20–29)
Calcium: 9.2 mg/dL (ref 8.6–10.2)
Chloride: 110 mmol/L — ABNORMAL HIGH (ref 96–106)
Creatinine, Ser: 0.85 mg/dL (ref 0.76–1.27)
GFR, EST AFRICAN AMERICAN: 103 mL/min/{1.73_m2} (ref >59)
GFR, EST NON AFRICAN AMERICAN: 90 mL/min/{1.73_m2} (ref >59)
Glucose: 79 mg/dL (ref 65–99)
POTASSIUM: 4.1 mmol/L (ref 3.5–5.2)
Sodium: 143 mmol/L (ref 134–144)

## 2017-08-29 ENCOUNTER — Other Ambulatory Visit: Payer: Self-pay | Admitting: Physician Assistant

## 2017-08-29 DIAGNOSIS — I2510 Atherosclerotic heart disease of native coronary artery without angina pectoris: Secondary | ICD-10-CM

## 2017-08-29 NOTE — Telephone Encounter (Signed)
Last seen 03/13/17  [facility]

## 2017-08-31 ENCOUNTER — Other Ambulatory Visit: Payer: Self-pay | Admitting: Physician Assistant

## 2017-09-03 NOTE — Progress Notes (Signed)
Cardiology Office Note   Date:  09/06/2017   ID:  Tony Patrick, DOB [birth year], MRN [id number]  PCP:  Terald Sleeper, PA-C  Cardiologist:  Dr. Marlou Porch     Chief Complaint  Patient presents with  . Cardiomyopathy      History of Present Illness: Tony Patrick is a 67 y.o. male who presents for cardiomyopathy and is here for medication eval.   Pt with hx of Coronary artery disease status post anterior myocardial infarction in 1995 with Subsequent cardiac catheterization in 2001, see below with ejection fraction of 25% at that time, currently 30-35%, did not wish to proceed with ICD here for follow-up.  Multiple discussions about ICDin the past, not interested. He states that his first heart attack was in 1995 and he has lived a long life since then.  Back in 2012 nuclear stress test was recommended but he did not wish to have this done.  He is still working with the Therapist, nutritional periodically. His orthopedic issues predominate.  06/26/2017-since last visit had total left knee arthroplasty on 01/29/2017. Had rehabilitation postoperatively.  Now on entresto for ICM EF 30-35%  Pt has refused ICD.   entresto was increased on last visit to 97/103  Back for BP check and labs.    BP is elevated he does not understand this either.  No chest pain or SOB.     Past Medical History:  Diagnosis Date  . Arthritis   . Atherosclerotic plaque   . Basal cell carcinoma   . CAD (coronary artery disease)    Anterior MI, [facility], 1995, no PCI, total LAD  / catheterization 2001, 30% LAD beyond the origin of a large diagonal, circumflex normal, RCA normal, anterolateral and apical  akinesis, ejection fraction 25-35% range  . Dyslipidemia   . Ejection fraction < 50%    EF 25-35%, catheterization 2001 / EF 20-25%, global hypokinesis, echo in June, 2012  . GERD (gastroesophageal reflux disease)   . History of bronchitis   . Hypoxia    Pneumonia, hospitalization, June, 2012  .  kidney stones   . MI (myocardial infarction) ([place]) 1995  . Pneumonia 2012  . S/P colectomy    Benign tumor    Past Surgical History:  Procedure Laterality Date  . CARDIAC CATHETERIZATION     Patient states he has not had cardiac cath  . CHEST TUBE INSERTION    . COLECTOMY  1981   benign mass  . COLONOSCOPY WITH PROPOFOL N/A 07/04/2014   Procedure: COLONOSCOPY WITH PROPOFOL;  Surgeon: Gatha Mayer, MD;  [facility];  Service: Endoscopy;  Laterality: N/A;  . JOINT REPLACEMENT    . KNEE ARTHROSCOPY Left 07/19/2015   Procedure: LEFT ARTHROSCOPY KNEE WITH MEDIAL MENISCECTOMY;  Surgeon: Latanya Maudlin, MD;  Location: [facility];  Service: Orthopedics;  Laterality: Left;  LMA  . LEG SURGERY Left    cyst  . PLEURAL SCARIFICATION  1978  . SHOULDER OPEN ROTATOR CUFF REPAIR Left 05/26/2015   Procedure: LEFT OPEN ACROMINECTOMY AND REPAIR WITH GRAFT AND ANCHOR ROTATOR CUFF TEAR ;  Surgeon: Latanya Maudlin, MD;  Location: [facility];  Service: Orthopedics;  Laterality: Left;  Left shoulder block in holding  . TOTAL KNEE ARTHROPLASTY Left 01/29/2017   Procedure: LEFT TOTAL KNEE ARTHROPLASTY;  Surgeon: Latanya Maudlin, MD;  [facility]: [facility];  Service: Orthopedics;  Laterality: Left;  Adductor Block     Current Outpatient Medications  Medication Sig Dispense Refill  . albuterol (PROVENTIL HFA;VENTOLIN HFA)  108 (90 Base) MCG/ACT inhaler Inhale 1-2 puffs into the lungs every 6 (six) hours as needed for wheezing. 1 Inhaler 0  . aspirin 81 MG chewable tablet Chew 81 mg by mouth daily.    Marland Kitchen azithromycin (ZITHROMAX Z-PAK) 250 MG tablet Take as directed 6 each 0  . carvedilol (COREG) 25 MG tablet TAKE 1 TABLET TWICE A DAY 180 tablet 0  . cetirizine (ZYRTEC) 10 MG tablet TAKE 1 TABLET DAILY (Patient taking differently: Take 10 mg by mouth daily. ) 90 tablet 0  . diclofenac sodium (VOLTAREN) 1 % GEL Apply 1 application topically 3 (three) times daily as needed (for hands). 1200 g 3  . finasteride (PROSCAR)  5 MG tablet Take 5 mg by mouth daily.     . fluticasone (FLONASE) 50 MCG/ACT nasal spray Place 2 sprays into both nostrils daily. 48 g 2  . ibuprofen (ADVIL,MOTRIN) 800 MG tablet TAKE 1 TABLET THREE TIMES A DAY 270 tablet 0  . ipratropium-albuterol (DUONEB) 0.5-2.5 (3) MG/3ML SOLN Take 3 mLs by nebulization every 6 (six) hours as needed (shortness of breath).    Marland Kitchen omeprazole (PRILOSEC) 20 MG capsule TAKE 1 CAPSULE DAILY 90 capsule 0  . sacubitril-valsartan (ENTRESTO) 97-103 MG Take 1 tablet by mouth 2 (two) times daily. 60 tablet 0  . simvastatin (ZOCOR) 40 MG tablet TAKE 1 TABLET EVERY EVENING 90 tablet 3  . tamsulosin (FLOMAX) 0.4 MG CAPS capsule TAKE 1 CAPSULE 30 MINUTES AFTER THE SAMEMEAL EACH DAY 90 capsule 1  . spironolactone (ALDACTONE) 25 MG tablet Take 0.5 tablets (12.5 mg total) by mouth daily. 90 tablet 3   No current facility-administered medications for this visit.     Allergies:   Ivp dye [iodinated diagnostic agents] and Morphine and related    Social History:  The patient  reports that he quit smoking about 25 years ago. His smoking use included cigarettes. He has a 64.00 pack-year smoking history. He has never used smokeless tobacco. He reports that he does not drink alcohol or use drugs.   Family History:  [name] family history includes Cancer in his brother and maternal grandmother; Diabetes in his mother; Hypertension in his mother; Melanoma in his brother; Prostatitis in his brother.    ROS:  General:no colds or fevers, no weight changes Skin:no rashes or ulcers HEENT:no blurred vision, no congestion CV:see HPI PUL:see HPI GI:no diarrhea constipation or melena, no indigestion Neuro:no syncope, no lightheadedness   Wt Readings from Last 3 Encounters:  09/04/17 244 lb (110.7 kg)  08/12/17 242 lb (109.8 kg)  06/26/17 243 lb (110.2 kg)     PHYSICAL EXAM: VS:  BP (!) 150/82   Pulse 66   Ht 5\' 9"  (1.753 m)   Wt 244 lb (110.7 kg)   SpO2 97%   BMI 36.03  kg/m  , BMI Body mass index is 36.03 kg/m. General:Pleasant affect, NAD Skin:Warm and dry, brisk capillary refill HEENT:normocephalic, sclera clear, mucus membranes moist Neck:supple, no JVD, no bruits  Heart:S1S2 RRR without murmur, gallup, rub or click Lungs:clear without rales, rhonchi, or wheezes OIZ:TIWP, non tender, + BS, do not palpate liver spleen or masses Ext:no lower ext edema, 2+ pedal pulses, 2+ radial pulses Neuro:alert and oriented X 3, MAE, follows commands, + facial symmetry    EKG:  EKG is ordered today. The ekg ordered today demonstrates SR non specific intraventricular block LBBB no acute changes.    Recent Labs: 01/20/2017: ALT 16 01/31/2017: Hemoglobin 12.9; Platelets 162 08/25/2017: BUN 24; Creatinine,  Ser 0.85; Potassium 4.1; Sodium 143    Lipid Panel    Component Value Date/Time   CHOL 140 05/20/2016 1029   TRIG 116 05/20/2016 1029   HDL 40 05/20/2016 1029   CHOLHDL 3.5 05/20/2016 1029   LDLCALC 77 05/20/2016 1029       Other studies Reviewed: Additional studies/ records that were reviewed today include: . Study Conclusions  - Left ventricle: The cavity size was severely dilated. Wall thickness was increased in a pattern of mild LVH. Systolic function was moderately to severely reduced. The estimated ejection fraction was in the range of 30% to 35%. Predominant anterior, apical and inferoapical akinesis suggestive of LAD infarct. Doppler parameters are consistent with abnormal left ventricular relaxation (grade 1 diastolic dysfunction). The E/e&' ratio is >15, suggesting elevated LV filling pressure. - Left atrium: The atrium was mildly dilated. - Right ventricle: The cavity size was mildly dilated. Systolic function was normal. - Inferior vena cava: The vessel was normal in size. The respirophasic diameter changes were in the normal range (= 50%), consistent with normal central venous pressure.  Impressions:  -  Compared to a prior study in 2012, the LVEF has improved to 30-35% with predominantly LAD territory wall motion abnormalities.   Cardiac Cath 05/30/10 ANGIOGRAPHIC DATA: The left main coronary artery is free of significant disease.  The left anterior descending artery courses to the apex. Beyond the origin of the large diagonal branch there is narrowing that measures about 30% in luminal diameter. This does not appear to be high-grade. The LAD courses to the apex and wraps the apical tip. There was a large diagonal branch with minimal ostial irregularity but no high-grade lesions. There are smaller distal diagonal branches.  The circumflex coronary provides a small first marginal branch that is insignificant. The circumflex consists predominately of a very large marginal branch that courses out over the anterolateral wall and bifurcates distally and is free of critical disease. The AV circumflex is small and provides an atrial circumflex branch.  The right coronary artery is a large dominant vessel. The right coronary artery provides a posterior descending branch which bifurcates and five posterolateral branches. The distal right coronary demonstrates no high-grade lesions.  LEFT VENTRICULOGRAPHY: Ventriculography in the RAO projection reveals anterolateral and apical akinesis. The inferobasal and superior basal segments appear to move. Ejection fraction was calculated at 25% but visually was more in the range of 35%. No significant mitral regurgitation was noted.  CONCLUSIONS: 1. Moderately severe reduction in global left ventricular function. 2. No evidence of re-stenosis in the left anterior descending artery. 3. Continued patency of the circumflex and right coronary arteries.  DISPOSITION: The patient is already on an ACE inhibitor, beta blockers and cholesterol lowering agent. He is also on aspirin. Protonix has been added to his regimen because  of a history of GERD. A GI consult will be obtained. I have called Dr. Mallie Mussel and also discussed the case with Roque Cash.   ASSESSMENT AND PLAN:  1.  Dilated CM on entresto at 97/103 continue  2.  HTN elevated discussed with Dr. Marlou Porch will add spironolactone at 12.5 mg daily and BMP next week.  And follow up with HTN clinic in 2-3 weeks  Follow up with Dr. Marlou Porch in 2-3 months    Current medicines are reviewed with the patient today.  The patient Has no concerns regarding medicines.  The following changes have been made:  See above Labs/ tests ordered today include:see above  Disposition:  FU:  see above  Signed, Cecilie Kicks, NP  09/06/2017 6:01 PM    [facility] [place], [place], [place] Phone: [phone]; Fax: [phone]

## 2017-09-04 ENCOUNTER — Ambulatory Visit (INDEPENDENT_AMBULATORY_CARE_PROVIDER_SITE_OTHER): Payer: Medicare Other | Admitting: Cardiology

## 2017-09-04 VITALS — BP 150/82 | HR 66 | Ht 69.0 in | Wt 244.0 lb

## 2017-09-04 DIAGNOSIS — I255 Ischemic cardiomyopathy: Secondary | ICD-10-CM | POA: Diagnosis not present

## 2017-09-04 DIAGNOSIS — I420 Dilated cardiomyopathy: Secondary | ICD-10-CM

## 2017-09-04 DIAGNOSIS — I10 Essential (primary) hypertension: Secondary | ICD-10-CM | POA: Diagnosis not present

## 2017-09-04 MED ORDER — SPIRONOLACTONE 25 MG PO TABS: 12.5000 mg | ORAL_TABLET | Freq: Every day | ORAL | 3 refills | Status: DC

## 2017-09-04 NOTE — Patient Instructions (Signed)
Medication Instructions:  1. START SPIRONOLACTONE 25 MG TABLET WITH THE DIRECTIONS TO READ: TAKE 1/2 TABLET ONCE A DAY = 12.5 MG DAILY; RX HAS BEEN SENT IN  Labwork: BMET TO BE DONE IN 1 WEEK  Testing/Procedures: NONE ORDERED TODAY  Follow-Up: FOLLOW UP WITH THE HYPERTENSION CLINIC IN THE NEXT 2-3 WEEKS   Any Other Special Instructions Will Be Listed Below (If Applicable).     If you need a refill on your cardiac medications before your next appointment, please call your pharmacy.

## 2017-09-06 ENCOUNTER — Encounter: Payer: Self-pay | Admitting: Cardiology

## 2017-09-07 ENCOUNTER — Other Ambulatory Visit: Payer: Self-pay | Admitting: Physician Assistant

## 2017-09-08 NOTE — Telephone Encounter (Signed)
Last seen 03/13/17  [facility]

## 2017-09-11 ENCOUNTER — Other Ambulatory Visit: Payer: Self-pay

## 2017-09-11 ENCOUNTER — Other Ambulatory Visit: Payer: Medicare Other | Admitting: *Deleted

## 2017-09-11 DIAGNOSIS — I10 Essential (primary) hypertension: Secondary | ICD-10-CM

## 2017-09-11 LAB — BASIC METABOLIC PANEL
BUN/Creatinine Ratio: 18 (ref 10–24)
BUN: 18 mg/dL (ref 8–27)
CALCIUM: 9 mg/dL (ref 8.6–10.2)
CO2: 20 mmol/L (ref 20–29)
Chloride: 108 mmol/L — ABNORMAL HIGH (ref 96–106)
Creatinine, Ser: 1.01 mg/dL (ref 0.76–1.27)
GFR calc Af Amer: 88 mL/min/{1.73_m2} (ref >59)
GFR calc non Af Amer: 76 mL/min/{1.73_m2} (ref >59)
GLUCOSE: 107 mg/dL — AB (ref 65–99)
POTASSIUM: 4.6 mmol/L (ref 3.5–5.2)
SODIUM: 144 mmol/L (ref 134–144)

## 2017-09-16 ENCOUNTER — Telehealth: Payer: Self-pay | Admitting: *Deleted

## 2017-09-16 DIAGNOSIS — Z79899 Other long term (current) drug therapy: Secondary | ICD-10-CM

## 2017-09-16 NOTE — Telephone Encounter (Signed)
-----   Message from Isaiah Serge, NP sent at 09/11/2017  4:02 PM EDT ----- labs are stable, since spironolactone is potassium sparring, decrease high potassium food.  Bananas, oranges, peppers Is his BP any better?   Recheck BMP in 2 weeks.

## 2017-09-26 ENCOUNTER — Other Ambulatory Visit: Payer: Medicare Other | Admitting: *Deleted

## 2017-09-26 DIAGNOSIS — Z79899 Other long term (current) drug therapy: Secondary | ICD-10-CM | POA: Diagnosis not present

## 2017-09-26 LAB — BASIC METABOLIC PANEL
BUN / CREAT RATIO: 25 — AB (ref 10–24)
BUN: 26 mg/dL (ref 8–27)
CO2: 19 mmol/L — ABNORMAL LOW (ref 20–29)
CREATININE: 1.06 mg/dL (ref 0.76–1.27)
Calcium: 9.2 mg/dL (ref 8.6–10.2)
Chloride: 106 mmol/L (ref 96–106)
GFR calc Af Amer: 82 mL/min/{1.73_m2} (ref >59)
GFR calc non Af Amer: 71 mL/min/{1.73_m2} (ref >59)
Glucose: 106 mg/dL — ABNORMAL HIGH (ref 65–99)
Potassium: 4 mmol/L (ref 3.5–5.2)
Sodium: 140 mmol/L (ref 134–144)

## 2017-09-30 ENCOUNTER — Ambulatory Visit (INDEPENDENT_AMBULATORY_CARE_PROVIDER_SITE_OTHER): Payer: Medicare Other | Admitting: Pharmacist

## 2017-09-30 DIAGNOSIS — I255 Ischemic cardiomyopathy: Secondary | ICD-10-CM

## 2017-09-30 DIAGNOSIS — I2510 Atherosclerotic heart disease of native coronary artery without angina pectoris: Secondary | ICD-10-CM

## 2017-09-30 MED ORDER — SACUBITRIL-VALSARTAN 97-103 MG PO TABS: 1.0000 | ORAL_TABLET | Freq: Two times a day (BID) | ORAL | 3 refills | Status: DC

## 2017-09-30 MED ORDER — SPIRONOLACTONE 25 MG PO TABS: 25.0000 mg | ORAL_TABLET | Freq: Every day | ORAL | 3 refills | Status: DC

## 2017-09-30 MED ORDER — CARVEDILOL 25 MG PO TABS: 25.0000 mg | ORAL_TABLET | Freq: Two times a day (BID) | ORAL | 3 refills | Status: DC

## 2017-09-30 NOTE — Progress Notes (Signed)
Patient ID: [name]                 DOB: April 13, 1950                      MRN: [id number]     HPI: Tony Patrick is a 67 y.o. male patient of Dr Tony Patrick referred by Tony Kicks, NP to HTN clinic. PMH is significant for CAD s/p anterior MI in 1005, LVEF 25% at the time which has since improved to 30-35% on 05/2015 echo (pt has refused ICD), HTN, and HLD. At last visit, BP was elevated at 150/75mmHg and spironolactone 12.5mg  was started. F/u BMET was stable. Pt presents today for further management.  Patient presents today in good spirits. He reports tolerating his medications well. Denies dizziness, blurred vision, headache, SOB, or swelling. He has been checking BP at home which has ranged 118/70 at his PCP last week to 135/82 at home. He has [employer] so medication copays are affordable - he prefers 3 month supply of medications to be filled through [facility] [employer]. He has been taking his BID medications at 9am and 5pm. We discussed spreading these doses apart closer to 12 hours for better BP coverage.  Current HTN meds: carvedilol 25mg  BID, Entresto 97-103mg  BID, spironolactone 12.5mg  daily Previously tried:  BP goal: <130/49mmHg  Family History: [name] family history includes Cancer in his brother and maternal grandmother; Diabetes in his mother; Hypertension in his mother; Melanoma in his brother; Prostatitis in his brother.   Social History: The patient  reports that he quit smoking about 25 years ago. His smoking use included cigarettes. He has a 64.00 pack-year smoking history. He has never used smokeless tobacco. He reports that he does not drink alcohol or use drugs.   Diet: Eats most meals at home. Likes most food. Eats more chicken than burgers. Tries to bake food and minimizes fried food. Does not add salt to food. Drinks 1-2 cups of coffee each day.  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  Home BP readings: 118/70 at PCP  last week, 130/82  Wt Readings from Last 3 Encounters:  09/04/17 244 lb (110.7 kg)  08/12/17 242 lb (109.8 kg)  06/26/17 243 lb (110.2 kg)   BP Readings from Last 3 Encounters:  09/04/17 (!) 150/82  08/12/17 (!) 150/78  06/26/17 128/72   Pulse Readings from Last 3 Encounters:  09/04/17 66  08/12/17 62  06/26/17 67    Renal function: CrCl cannot be calculated (Unknown ideal weight.).  Past Medical History:  Diagnosis Date  . Arthritis   . Atherosclerotic plaque   . Basal cell carcinoma   . CAD (coronary artery disease)    Anterior MI, [facility], 1995, no PCI, total LAD  / catheterization 2001, 30% LAD beyond the origin of a large diagonal, circumflex normal, RCA normal, anterolateral and apical  akinesis, ejection fraction 25-35% range  . Dyslipidemia   . Ejection fraction < 50%    EF 25-35%, catheterization 2001 / EF 20-25%, global hypokinesis, echo in June, 2012  . GERD (gastroesophageal reflux disease)   . History of bronchitis   . Hypoxia    Pneumonia, hospitalization, June, 2012  . kidney stones   . MI (myocardial infarction) ([place]) 1995  . Pneumonia 2012  . S/P colectomy    Benign tumor    Current Outpatient Medications on File Prior to Visit  Medication Sig Dispense Refill  . albuterol (PROVENTIL  HFA;VENTOLIN HFA) 108 (90 Base) MCG/ACT inhaler Inhale 1-2 puffs into the lungs every 6 (six) hours as needed for wheezing. 1 Inhaler 0  . aspirin 81 MG chewable tablet Chew 81 mg by mouth daily.    Marland Kitchen azithromycin (ZITHROMAX Z-PAK) 250 MG tablet Take as directed 6 each 0  . carvedilol (COREG) 25 MG tablet TAKE 1 TABLET TWICE A DAY 180 tablet 0  . cetirizine (ZYRTEC) 10 MG tablet TAKE 1 TABLET DAILY 90 tablet 0  . diclofenac sodium (VOLTAREN) 1 % GEL Apply 1 application topically 3 (three) times daily as needed (for hands). 1200 g 3  . finasteride (PROSCAR) 5 MG tablet Take 5 mg by mouth daily.     . fluticasone (FLONASE) 50 MCG/ACT nasal spray Place 2 sprays into both  nostrils daily. 48 g 2  . ibuprofen (ADVIL,MOTRIN) 800 MG tablet TAKE 1 TABLET THREE TIMES A DAY 270 tablet 0  . ipratropium-albuterol (DUONEB) 0.5-2.5 (3) MG/3ML SOLN Take 3 mLs by nebulization every 6 (six) hours as needed (shortness of breath).    Marland Kitchen omeprazole (PRILOSEC) 20 MG capsule TAKE 1 CAPSULE DAILY 90 capsule 0  . sacubitril-valsartan (ENTRESTO) 97-103 MG Take 1 tablet by mouth 2 (two) times daily. 60 tablet 0  . simvastatin (ZOCOR) 40 MG tablet TAKE 1 TABLET EVERY EVENING 90 tablet 3  . spironolactone (ALDACTONE) 25 MG tablet Take 0.5 tablets (12.5 mg total) by mouth daily. 90 tablet 3  . tamsulosin (FLOMAX) 0.4 MG CAPS capsule TAKE 1 CAPSULE 30 MINUTES AFTER THE SAMEMEAL EACH DAY 90 capsule 1   No current facility-administered medications on file prior to visit.     Allergies  Allergen Reactions  . Ivp Dye [Iodinated Diagnostic Agents] Swelling and Other (See Comments)    Arrest  . Morphine And Related Nausea And Vomiting     Assessment/Plan:  1. Hypertension/HF medication optimization - BP much improved today however remains slightly above goal <130/41mmHg. Will increase spironolactone to 25mg  daily and continue Entresto 97-103mg  BID and carvedilol 25mg  BID. Pt will move PM dose of Entresto and carvedilol from 5pm to 9pm. Recheck BMET and BP in 2 weeks.   Megan E. Supple, PharmD, BCACP, [place] [address] [place], [place] [address] Phone: [phone]; Fax: [phone] 09/30/2017 3:07 PM

## 2017-09-30 NOTE — Patient Instructions (Addendum)
It was nice to meet you today]  Increase your spironolactone from 1/2 tablet to 1 tablet daily (25mg )  Continue taking your other medications  Move your evening medicines to 8-9pm  Monitor your blood pressure at home - your goal is < 130/34mmHg   Follow up in clinic in 2 weeks for blood pressure check and lab work

## 2017-10-14 ENCOUNTER — Ambulatory Visit (INDEPENDENT_AMBULATORY_CARE_PROVIDER_SITE_OTHER): Payer: Medicare Other | Admitting: Pharmacist

## 2017-10-14 ENCOUNTER — Encounter: Payer: Self-pay | Admitting: Pharmacist

## 2017-10-14 ENCOUNTER — Other Ambulatory Visit: Payer: Medicare Other

## 2017-10-14 VITALS — BP 122/70 | HR 64

## 2017-10-14 DIAGNOSIS — I2510 Atherosclerotic heart disease of native coronary artery without angina pectoris: Secondary | ICD-10-CM

## 2017-10-14 DIAGNOSIS — I420 Dilated cardiomyopathy: Secondary | ICD-10-CM

## 2017-10-14 DIAGNOSIS — I255 Ischemic cardiomyopathy: Secondary | ICD-10-CM

## 2017-10-14 LAB — BASIC METABOLIC PANEL
BUN/Creatinine Ratio: 21 (ref 10–24)
BUN: 21 mg/dL (ref 8–27)
CALCIUM: 9.5 mg/dL (ref 8.6–10.2)
CO2: 23 mmol/L (ref 20–29)
Chloride: 103 mmol/L (ref 96–106)
Creatinine, Ser: 1.01 mg/dL (ref 0.76–1.27)
GFR calc Af Amer: 87 mL/min/{1.73_m2} (ref >59)
GFR, EST NON AFRICAN AMERICAN: 76 mL/min/{1.73_m2} (ref >59)
Glucose: 107 mg/dL — ABNORMAL HIGH (ref 65–99)
Potassium: 4.4 mmol/L (ref 3.5–5.2)
Sodium: 141 mmol/L (ref 134–144)

## 2017-10-14 NOTE — Patient Instructions (Addendum)
CONTINUE all medications as prescribed.   If your pressure trends above 130/80 please call the clinic.   Follow up with Dr. Marlou Porch as scheduled (June 2020) and blood pressure clinic as needed.

## 2017-10-14 NOTE — Progress Notes (Signed)
Patient ID: Tony Patrick                 DOB: 1950-09-09                      MRN: [id number]     HPI: Tony Patrick is a 67 y.o. male patient of Dr. Marlou Porch who presents today for hypertension/HF follow up. PMH significant for CAD s/p anterior MI in 1005, LVEF 25% at the time which has since improved to 30-35% on 05/2015 echo (pt has refused ICD), HTN, and HLD. At his last visit his spironolactone was increased to 25mg  daily.   He presents today for additional management. He states he has done well on increase dose of spironolactone. He denies chest pain, SOB, dizziness and headache. He also states his pressures look good.   Current HTN meds:  Carvedilol 25mg  BID Entresto 97/103mg  BID Spironolactone 25mg  daily in the morning  Previously tried:   BP goal: <130/88mmHg  Family History: [name] family history includes Cancer in his brother and maternal grandmother; Diabetes in his mother; Hypertension in his mother; Melanoma in his brother; Prostatitis in his brother.  Social History: The patientreports that he quit smoking about 25 years ago. His smoking use included cigarettes. He has a 64.00 pack-year smoking history. He has never used smokeless tobacco. He reports that he does not drink alcohol or use drugs.  Diet: Eats most meals at home. Likes most food. Eats more chicken than burgers. Tries to bake food and minimizes fried food. Does not add salt to food. Drinks 1-2 cups of coffee each day.  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  Home BP readings: 130/80 - 118/76  Wt Readings from Last 3 Encounters:  09/04/17 244 lb (110.7 kg)  08/12/17 242 lb (109.8 kg)  06/26/17 243 lb (110.2 kg)   BP Readings from Last 3 Encounters:  10/14/17 122/70  09/30/17 134/86  09/04/17 (!) 150/82   Pulse Readings from Last 3 Encounters:  10/14/17 64  09/30/17 (!) 56  09/04/17 66    Renal function: CrCl cannot be calculated (Unknown  ideal weight.).  Past Medical History:  Diagnosis Date  . Arthritis   . Atherosclerotic plaque   . Basal cell carcinoma   . CAD (coronary artery disease)    Anterior MI, [facility], 1995, no PCI, total LAD  / catheterization 2001, 30% LAD beyond the origin of a large diagonal, circumflex normal, RCA normal, anterolateral and apical  akinesis, ejection fraction 25-35% range  . Dyslipidemia   . Ejection fraction < 50%    EF 25-35%, catheterization 2001 / EF 20-25%, global hypokinesis, echo in June, 2012  . GERD (gastroesophageal reflux disease)   . History of bronchitis   . Hypoxia    Pneumonia, hospitalization, June, 2012  . kidney stones   . MI (myocardial infarction) ([place]) 1995  . Pneumonia 2012  . S/P colectomy    Benign tumor    Current Outpatient Medications on File Prior to Visit  Medication Sig Dispense Refill  . albuterol (PROVENTIL HFA;VENTOLIN HFA) 108 (90 Base) MCG/ACT inhaler Inhale 1-2 puffs into the lungs every 6 (six) hours as needed for wheezing. 1 Inhaler 0  . aspirin 81 MG chewable tablet Chew 81 mg by mouth daily.    Marland Kitchen azithromycin (ZITHROMAX Z-PAK) 250 MG tablet Take as directed 6 each 0  . carvedilol (COREG) 25 MG tablet Take 1 tablet (25 mg total) by mouth 2 (  two) times daily. 180 tablet 3  . cetirizine (ZYRTEC) 10 MG tablet TAKE 1 TABLET DAILY 90 tablet 0  . diclofenac sodium (VOLTAREN) 1 % GEL Apply 1 application topically 3 (three) times daily as needed (for hands). 1200 g 3  . finasteride (PROSCAR) 5 MG tablet Take 5 mg by mouth daily.     . fluticasone (FLONASE) 50 MCG/ACT nasal spray Place 2 sprays into both nostrils daily. 48 g 2  . ibuprofen (ADVIL,MOTRIN) 800 MG tablet TAKE 1 TABLET THREE TIMES A DAY 270 tablet 0  . omeprazole (PRILOSEC) 20 MG capsule TAKE 1 CAPSULE DAILY 90 capsule 0  . sacubitril-valsartan (ENTRESTO) 97-103 MG Take 1 tablet by mouth 2 (two) times daily. 180 tablet 3  . simvastatin (ZOCOR) 40 MG tablet TAKE 1 TABLET EVERY EVENING 90 tablet  3  . spironolactone (ALDACTONE) 25 MG tablet Take 1 tablet (25 mg total) by mouth daily. 90 tablet 3  . tamsulosin (FLOMAX) 0.4 MG CAPS capsule TAKE 1 CAPSULE 30 MINUTES AFTER THE SAMEMEAL EACH DAY 90 capsule 1  . ipratropium-albuterol (DUONEB) 0.5-2.5 (3) MG/3ML SOLN Take 3 mLs by nebulization every 6 (six) hours as needed (shortness of breath).     No current facility-administered medications on file prior to visit.     Allergies  Allergen Reactions  . Ivp Dye [Iodinated Diagnostic Agents] Swelling and Other (See Comments)    Arrest  . Morphine And Related Nausea And Vomiting    Blood pressure 122/70, pulse 64.   Assessment/Plan: Hypertension: BMET today. BP today is at goal. Medications are optimized. Will continue therapy as prescribed. Follow up with Dr. Marlou Porch as scheduled and blood pressure clinic as needed.    Thank you, Lelan Pons. Patterson Hammersmith, [facility]  10/14/2017 11:39 AM

## 2017-10-16 ENCOUNTER — Ambulatory Visit: Payer: Self-pay | Admitting: *Deleted

## 2017-11-06 ENCOUNTER — Ambulatory Visit (INDEPENDENT_AMBULATORY_CARE_PROVIDER_SITE_OTHER): Payer: Medicare Other | Admitting: Family

## 2017-11-06 ENCOUNTER — Other Ambulatory Visit: Payer: Self-pay | Admitting: Physician Assistant

## 2017-11-06 ENCOUNTER — Encounter: Payer: Self-pay | Admitting: Family

## 2017-11-06 VITALS — BP 107/67 | HR 67 | Temp 97.2°F | Ht 69.0 in | Wt 244.4 lb

## 2017-11-06 DIAGNOSIS — I255 Ischemic cardiomyopathy: Secondary | ICD-10-CM | POA: Diagnosis not present

## 2017-11-06 DIAGNOSIS — B9689 Other specified bacterial agents as the cause of diseases classified elsewhere: Secondary | ICD-10-CM | POA: Diagnosis not present

## 2017-11-06 DIAGNOSIS — J208 Acute bronchitis due to other specified organisms: Secondary | ICD-10-CM | POA: Diagnosis not present

## 2017-11-06 MED ORDER — HYDROCODONE-HOMATROPINE 5-1.5 MG/5ML PO SYRP: 5.0000 mL | ORAL_SOLUTION | Freq: Four times a day (QID) | ORAL | 0 refills | Status: DC | PRN

## 2017-11-06 MED ORDER — AZITHROMYCIN 250 MG PO TABS: ORAL_TABLET | ORAL | 0 refills | Status: DC

## 2017-11-06 NOTE — Progress Notes (Signed)
   Subjective:    Patient ID: Tony Patrick, male    DOB: 12/02/1950, 67 y.o.   MRN: [id number]  Chief Complaint  Patient presents with  . Cough    Cough  This is a new problem. The current episode started in the past 7 days. The problem has been gradually worsening. The problem occurs every few minutes. The cough is productive of purulent sputum and productive of sputum. Associated symptoms include headaches, nasal congestion, postnasal drip, rhinorrhea, a sore throat and wheezing. Pertinent negatives include no chills, ear congestion, ear pain, fever, myalgias or shortness of breath. The symptoms are aggravated by lying down. He has tried rest for the symptoms. The treatment provided mild relief.      Review of Systems  Constitutional: Negative for chills and fever.  HENT: Positive for postnasal drip, rhinorrhea and sore throat. Negative for ear pain.   Respiratory: Positive for cough and wheezing. Negative for shortness of breath.   Musculoskeletal: Negative for myalgias.  Neurological: Positive for headaches.  All other systems reviewed and are negative.      Objective:   Physical Exam  Constitutional: He is oriented to person, place, and time. He appears well-developed and well-nourished. No distress.  HENT:  Head: Normocephalic.  Right Ear: External ear normal.  Left Ear: External ear normal.  Nose: Mucosal edema and rhinorrhea present.  Mouth/Throat: Posterior oropharyngeal erythema present.  Eyes: Pupils are equal, round, and reactive to light. Right eye exhibits no discharge. Left eye exhibits no discharge.  Neck: Normal range of motion. Neck supple. No thyromegaly present.  Cardiovascular: Normal rate, regular rhythm, normal heart sounds and intact distal pulses.  No murmur heard. Pulmonary/Chest: Effort normal. No respiratory distress. He has wheezes.  Intermittent nonproductive cough   Abdominal: Soft. Bowel sounds are normal. He exhibits no distension. There is  no tenderness.  Musculoskeletal: Normal range of motion. He exhibits no edema or tenderness.  Neurological: He is alert and oriented to person, place, and time. He has normal reflexes. No cranial nerve deficit.  Skin: Skin is warm and dry. No rash noted. No erythema.  Psychiatric: He has a normal mood and affect. His behavior is normal. Judgment and thought content normal.  Vitals reviewed.     BP 107/67   Pulse 67   Temp (!) 97.2 F (36.2 C) (Oral)   Ht 5\' 9"  (1.753 m)   Wt 244 lb 6.4 oz (110.9 kg)   BMI 36.09 kg/m      Assessment & Plan:  Tony Patrick comes in today with chief complaint of Cough   Diagnosis and orders addressed:  1. Acute bacterial bronchitis - Take meds as prescribed - Use a cool mist humidifier  -Use saline nose sprays frequently -Force fluids -For any cough or congestion  Use plain Mucinex- regular strength or max strength is fine -For fever or aces or pains- take tylenol or ibuprofen. -Throat lozenges if help -RTO if symptoms worsen or do not improve - HYDROcodone-homatropine (HYCODAN) 5-1.5 MG/5ML syrup; Take 5-10 mLs by mouth every 6 (six) hours as needed for cough.  Dispense: 240 mL; Refill: 0 - azithromycin (ZITHROMAX Z-PAK) 250 MG tablet; As directed  Dispense: 1 each; Refill: 0   Tony Dun, FNP

## 2017-11-06 NOTE — Patient Instructions (Signed)

## 2017-11-06 NOTE — Telephone Encounter (Signed)
Pt has appt today

## 2017-11-14 ENCOUNTER — Other Ambulatory Visit: Payer: Self-pay | Admitting: Physician Assistant

## 2017-11-28 ENCOUNTER — Other Ambulatory Visit: Payer: Self-pay | Admitting: *Deleted

## 2017-11-28 MED ORDER — IBUPROFEN 800 MG PO TABS: 800.0000 mg | ORAL_TABLET | Freq: Three times a day (TID) | ORAL | 0 refills | Status: DC

## 2017-12-01 ENCOUNTER — Other Ambulatory Visit: Payer: Self-pay | Admitting: Physician Assistant

## 2018-01-05 ENCOUNTER — Emergency Department (HOSPITAL_COMMUNITY): Payer: Medicare Other

## 2018-01-05 ENCOUNTER — Encounter (HOSPITAL_COMMUNITY)
Admission: EM | Disposition: A | Discharge status: Home / Self Care | Payer: Self-pay | Source: Home / Self Care | Attending: Emergency Medicine

## 2018-01-05 ENCOUNTER — Encounter (HOSPITAL_COMMUNITY): Payer: Self-pay | Admitting: Emergency Medicine

## 2018-01-05 ENCOUNTER — Observation Stay (HOSPITAL_COMMUNITY)
Admission: EM | Admit: 2018-01-05 | Discharge: 2018-01-06 | Disposition: A | Discharge status: Home / Self Care | Payer: Medicare Other | Attending: Internal Medicine | Admitting: Internal Medicine

## 2018-01-05 DIAGNOSIS — Z87891 Personal history of nicotine dependence: Secondary | ICD-10-CM | POA: Diagnosis not present

## 2018-01-05 DIAGNOSIS — R0789 Other chest pain: Secondary | ICD-10-CM | POA: Diagnosis not present

## 2018-01-05 DIAGNOSIS — I2510 Atherosclerotic heart disease of native coronary artery without angina pectoris: Secondary | ICD-10-CM | POA: Insufficient documentation

## 2018-01-05 DIAGNOSIS — N400 Enlarged prostate without lower urinary tract symptoms: Secondary | ICD-10-CM

## 2018-01-05 DIAGNOSIS — Z7982 Long term (current) use of aspirin: Secondary | ICD-10-CM | POA: Insufficient documentation

## 2018-01-05 DIAGNOSIS — R05 Cough: Secondary | ICD-10-CM | POA: Diagnosis not present

## 2018-01-05 DIAGNOSIS — Z791 Long term (current) use of non-steroidal anti-inflammatories (NSAID): Secondary | ICD-10-CM | POA: Diagnosis not present

## 2018-01-05 DIAGNOSIS — R55 Syncope and collapse: Secondary | ICD-10-CM | POA: Diagnosis not present

## 2018-01-05 DIAGNOSIS — E785 Hyperlipidemia, unspecified: Secondary | ICD-10-CM | POA: Diagnosis not present

## 2018-01-05 DIAGNOSIS — Z7951 Long term (current) use of inhaled steroids: Secondary | ICD-10-CM | POA: Diagnosis not present

## 2018-01-05 DIAGNOSIS — Z9049 Acquired absence of other specified parts of digestive tract: Secondary | ICD-10-CM | POA: Insufficient documentation

## 2018-01-05 DIAGNOSIS — M1990 Unspecified osteoarthritis, unspecified site: Secondary | ICD-10-CM | POA: Insufficient documentation

## 2018-01-05 DIAGNOSIS — Z87442 Personal history of urinary calculi: Secondary | ICD-10-CM | POA: Diagnosis not present

## 2018-01-05 DIAGNOSIS — I700 Atherosclerosis of aorta: Secondary | ICD-10-CM | POA: Insufficient documentation

## 2018-01-05 DIAGNOSIS — I959 Hypotension, unspecified: Secondary | ICD-10-CM | POA: Diagnosis not present

## 2018-01-05 DIAGNOSIS — Z79899 Other long term (current) drug therapy: Secondary | ICD-10-CM | POA: Insufficient documentation

## 2018-01-05 DIAGNOSIS — K219 Gastro-esophageal reflux disease without esophagitis: Secondary | ICD-10-CM | POA: Diagnosis not present

## 2018-01-05 DIAGNOSIS — I4891 Unspecified atrial fibrillation: Secondary | ICD-10-CM | POA: Diagnosis not present

## 2018-01-05 DIAGNOSIS — R42 Dizziness and giddiness: Secondary | ICD-10-CM | POA: Diagnosis not present

## 2018-01-05 DIAGNOSIS — Z85828 Personal history of other malignant neoplasm of skin: Secondary | ICD-10-CM | POA: Insufficient documentation

## 2018-01-05 DIAGNOSIS — N401 Enlarged prostate with lower urinary tract symptoms: Secondary | ICD-10-CM | POA: Diagnosis not present

## 2018-01-05 DIAGNOSIS — Z8249 Family history of ischemic heart disease and other diseases of the circulatory system: Secondary | ICD-10-CM | POA: Diagnosis not present

## 2018-01-05 DIAGNOSIS — I420 Dilated cardiomyopathy: Secondary | ICD-10-CM | POA: Diagnosis not present

## 2018-01-05 DIAGNOSIS — I252 Old myocardial infarction: Secondary | ICD-10-CM | POA: Diagnosis not present

## 2018-01-05 DIAGNOSIS — Z96652 Presence of left artificial knee joint: Secondary | ICD-10-CM | POA: Insufficient documentation

## 2018-01-05 DIAGNOSIS — I213 ST elevation (STEMI) myocardial infarction of unspecified site: Secondary | ICD-10-CM | POA: Diagnosis not present

## 2018-01-05 DIAGNOSIS — I371 Nonrheumatic pulmonary valve insufficiency: Secondary | ICD-10-CM | POA: Insufficient documentation

## 2018-01-05 DIAGNOSIS — I10 Essential (primary) hypertension: Secondary | ICD-10-CM | POA: Diagnosis not present

## 2018-01-05 DIAGNOSIS — R079 Chest pain, unspecified: Secondary | ICD-10-CM | POA: Insufficient documentation

## 2018-01-05 DIAGNOSIS — R0602 Shortness of breath: Secondary | ICD-10-CM | POA: Diagnosis not present

## 2018-01-05 DIAGNOSIS — R059 Cough, unspecified: Secondary | ICD-10-CM

## 2018-01-05 DIAGNOSIS — R350 Frequency of micturition: Secondary | ICD-10-CM | POA: Insufficient documentation

## 2018-01-05 LAB — CBC
HEMATOCRIT: 48.8 % (ref 39.0–52.0)
Hemoglobin: 15.8 g/dL (ref 13.0–17.0)
MCH: 30.4 pg (ref 26.0–34.0)
MCHC: 32.4 g/dL (ref 30.0–36.0)
MCV: 94 fL (ref 80.0–100.0)
Platelets: 192 10*3/uL (ref 150–400)
RBC: 5.19 MIL/uL (ref 4.22–5.81)
RDW: 13.6 % (ref 11.5–15.5)
WBC: 12.2 10*3/uL — ABNORMAL HIGH (ref 4.0–10.5)
nRBC: 0 % (ref 0.0–0.2)

## 2018-01-05 LAB — BASIC METABOLIC PANEL
Anion gap: 11 (ref 5–15)
BUN: 23 mg/dL (ref 8–23)
CHLORIDE: 110 mmol/L (ref 98–111)
CO2: 20 mmol/L — ABNORMAL LOW (ref 22–32)
Calcium: 9.1 mg/dL (ref 8.9–10.3)
Creatinine, Ser: 1.04 mg/dL (ref 0.61–1.24)
GFR calc Af Amer: >60 mL/min (ref >60)
GFR calc non Af Amer: >60 mL/min (ref >60)
Glucose, Bld: 129 mg/dL — ABNORMAL HIGH (ref 70–99)
Potassium: 4.3 mmol/L (ref 3.5–5.1)
SODIUM: 141 mmol/L (ref 135–145)

## 2018-01-05 LAB — I-STAT TROPONIN, ED: Troponin i, poc: 0.07 ng/mL (ref 0.00–0.08)

## 2018-01-05 SURGERY — LEFT HEART CATH AND CORONARY ANGIOGRAPHY
Anesthesia: LOCAL

## 2018-01-05 MED ORDER — DM-GUAIFENESIN ER 30-600 MG PO TB12
1.0000 | ORAL_TABLET | Freq: Two times a day (BID) | ORAL | Status: DC
  Administered 2018-01-06 (×2): 1 via ORAL
  Filled 2018-01-05 (×2): qty 1

## 2018-01-05 MED ORDER — SIMVASTATIN 20 MG PO TABS
40.0000 mg | ORAL_TABLET | Freq: Every evening | ORAL | Status: DC
  Administered 2018-01-06: 40 mg via ORAL
  Filled 2018-01-05: qty 2

## 2018-01-05 MED ORDER — IPRATROPIUM-ALBUTEROL 0.5-2.5 (3) MG/3ML IN SOLN: 3.0000 mL | Freq: Four times a day (QID) | RESPIRATORY_TRACT | Status: DC | PRN

## 2018-01-05 MED ORDER — NITROGLYCERIN 1 MG/10 ML FOR IR/CATH LAB
INTRA_ARTERIAL | Status: AC
  Filled 2018-01-05: qty 10

## 2018-01-05 MED ORDER — SODIUM CHLORIDE 0.9 % IV BOLUS
1000.0000 mL | Freq: Once | INTRAVENOUS | Status: AC
  Administered 2018-01-05: 1000 mL via INTRAVENOUS

## 2018-01-05 MED ORDER — LORATADINE 10 MG PO TABS
10.0000 mg | ORAL_TABLET | Freq: Every day | ORAL | Status: DC
  Administered 2018-01-06: 10 mg via ORAL
  Filled 2018-01-05: qty 1

## 2018-01-05 MED ORDER — SODIUM CHLORIDE 0.9 % IV SOLN
INTRAVENOUS | Status: AC
  Administered 2018-01-06: 01:00:00 via INTRAVENOUS

## 2018-01-05 MED ORDER — ASPIRIN 81 MG PO CHEW
81.0000 mg | CHEWABLE_TABLET | Freq: Every day | ORAL | Status: DC
  Administered 2018-01-06: 81 mg via ORAL
  Filled 2018-01-05: qty 1

## 2018-01-05 MED ORDER — LIDOCAINE HCL (PF) 1 % IJ SOLN
INTRAMUSCULAR | Status: AC
  Filled 2018-01-05: qty 30

## 2018-01-05 MED ORDER — PANTOPRAZOLE SODIUM 40 MG PO TBEC
40.0000 mg | DELAYED_RELEASE_TABLET | Freq: Every day | ORAL | Status: DC
  Administered 2018-01-06: 40 mg via ORAL
  Filled 2018-01-05: qty 1

## 2018-01-05 MED ORDER — TAMSULOSIN HCL 0.4 MG PO CAPS
0.4000 mg | ORAL_CAPSULE | Freq: Every day | ORAL | Status: DC
  Administered 2018-01-06: 0.4 mg via ORAL
  Filled 2018-01-05: qty 1

## 2018-01-05 MED ORDER — FLUTICASONE PROPIONATE 50 MCG/ACT NA SUSP: 2.0000 | Freq: Every day | NASAL | Status: DC | PRN

## 2018-01-05 MED ORDER — VERAPAMIL HCL 2.5 MG/ML IV SOLN
INTRAVENOUS | Status: AC
  Filled 2018-01-05: qty 2

## 2018-01-05 MED ORDER — FINASTERIDE 5 MG PO TABS
5.0000 mg | ORAL_TABLET | Freq: Every day | ORAL | Status: DC
  Administered 2018-01-06: 5 mg via ORAL
  Filled 2018-01-05: qty 1

## 2018-01-05 MED ORDER — HEPARIN (PORCINE) IN NACL 1000-0.9 UT/500ML-% IV SOLN
INTRAVENOUS | Status: AC
  Filled 2018-01-05: qty 1500

## 2018-01-05 MED ORDER — ENOXAPARIN SODIUM 40 MG/0.4ML ~~LOC~~ SOLN
40.0000 mg | Freq: Every day | SUBCUTANEOUS | Status: DC
  Administered 2018-01-06: 40 mg via SUBCUTANEOUS
  Filled 2018-01-05: qty 0.4

## 2018-01-05 NOTE — ED Notes (Signed)
Cancelled CODE at 20:27

## 2018-01-05 NOTE — ED Triage Notes (Signed)
BIB EMS from home as activated Code STEMI. Pt had sudden onset of central CP, SOB, dizziness, hypotension, urinary incontinence approx 1hr PTA. Pt given 324 ASA en route, now is CP free. Code STEMI cancelled upon arrival to ED per Dr Gilford Raid and Cardiology.

## 2018-01-05 NOTE — H&P (Signed)
History and Physical    Tony Patrick HEN:[id number] DOB: 08-08-1950 DOA: 01/05/2018  PCP: Terald Sleeper, PA-C Patient coming from: Home  Chief Complaint: Near syncope, CP  HPI: Tony Patrick is a 67 y.o. male with medical history significant of CAD and prior MI, hyperlipidemia, cardiomyopathy, GERD presenting to the hospital as code STEMI for evaluation of chest pain.  When EMS arrived, systolic blood pressure 80.  EMS called code STEMI.  He was given aspirin in route.  EKG without acute changes.  ED physician discussed the case with Dr. Ellyn Hack (STEMI cards) who did not think patient was having a STEMI and so code STEMI was canceled.    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  States after returning from work today he was sitting and texting on his phone when all of a sudden he felt hot and started sweating.  He felt like he could not breathe so he went outside to his porch to get fresh air.  His wife was there with him.  He felt like he was going to blackout but did not lose consciousness.  Denies having any chest pain.  Wife at bedside confirmed that patient did not lose consciousness.  Stated that patient slumped over in a chair a few times and was making groaning sounds but was awake and alert at that time and answering her questions. She did not notice any focal weakness.  Patient reports having a productive cough.  ED Course: Hemodynamically stable.  Afebrile.  White count 12.2.  I-STAT troponin 0.07.  EKG not suggestive of ACS.  Chest x-ray without acute abnormality.  Patient received 1 L normal saline bolus in the ED.  Review of Systems: As per HPI otherwise 10 point review of systems negative.  Past Medical History:  Diagnosis Date  . Arthritis   . Atherosclerotic plaque   . Basal cell carcinoma   . CAD (coronary artery disease)    Anterior MI, [facility], 1995, no PCI, total LAD  / catheterization 2001, 30% LAD beyond the origin of a large diagonal, circumflex normal, RCA  normal, anterolateral and apical  akinesis, ejection fraction 25-35% range  . Dyslipidemia   . Ejection fraction < 50%    EF 25-35%, catheterization 2001 / EF 20-25%, global hypokinesis, echo in June, 2012  . GERD (gastroesophageal reflux disease)   . History of bronchitis   . Hypoxia    Pneumonia, hospitalization, June, 2012  . kidney stones   . MI (myocardial infarction) ([employer]) 1995  . Pneumonia 2012  . S/P colectomy    Benign tumor    Past Surgical History:  Procedure Laterality Date  . CARDIAC CATHETERIZATION     Patient states he has not had cardiac cath  . CHEST TUBE INSERTION    . COLECTOMY  1981   benign mass  . COLONOSCOPY WITH PROPOFOL N/A 07/04/2014   Procedure: COLONOSCOPY WITH PROPOFOL;  Surgeon: Gatha Mayer, MD;  Location: [facility];  Service: Endoscopy;  Laterality: N/A;  . JOINT REPLACEMENT    . KNEE ARTHROSCOPY Left 07/19/2015   Procedure: LEFT ARTHROSCOPY KNEE WITH MEDIAL MENISCECTOMY;  Surgeon: Latanya Maudlin, MD;  Location: [facility];  Service: Orthopedics;  Laterality: Left;  LMA  . LEG SURGERY Left    cyst  . PLEURAL SCARIFICATION  1978  . SHOULDER OPEN ROTATOR CUFF REPAIR Left 05/26/2015   Procedure: LEFT OPEN ACROMINECTOMY AND REPAIR WITH GRAFT AND ANCHOR ROTATOR CUFF TEAR ;  Surgeon: Latanya Maudlin, MD;  Location: [facility];  Service: Orthopedics;  Laterality: Left;  Left shoulder block in holding  . TOTAL KNEE ARTHROPLASTY Left 01/29/2017   Procedure: LEFT TOTAL KNEE ARTHROPLASTY;  Surgeon: Latanya Maudlin, MD;  Location: [facility];  Service: Orthopedics;  Laterality: Left;  Adductor Block     reports that he quit smoking about 25 years ago. His smoking use included cigarettes. He has a 64.00 pack-year smoking history. He has never used smokeless tobacco. He reports that he does not drink alcohol or use drugs.  Allergies  Allergen Reactions  . Ivp Dye [Iodinated Diagnostic Agents] Swelling and Other (See Comments)    Arrest  . Morphine And Related Nausea And  Vomiting    Family History  Problem Relation Age of Onset  . Diabetes Mother   . Hypertension Mother   . Cancer Maternal Grandmother        type unknown  . Melanoma Brother   . Cancer Brother        type unknown  . Prostatitis Brother     Prior to Admission medications   Medication Sig Start Date End Date Taking? Authorizing Provider  albuterol (PROVENTIL HFA;VENTOLIN HFA) 108 (90 Base) MCG/ACT inhaler Inhale 1-2 puffs into the lungs every 6 (six) hours as needed for wheezing. 03/04/16  Yes Eustaquio Maize, MD  aspirin 81 MG chewable tablet Chew 81 mg by mouth daily.   Yes [provider]  carvedilol (COREG) 25 MG tablet Take 1 tablet (25 mg total) by mouth 2 (two) times daily. 09/30/17  Yes Jerline Pain, MD  cetirizine (ZYRTEC) 10 MG tablet TAKE 1 TABLET DAILY Patient taking differently: Take 10 mg by mouth daily.  11/14/17  Yes Terald Sleeper, PA-C  diclofenac sodium (VOLTAREN) 1 % GEL Apply 1 application topically 3 (three) times daily as needed (for hands). 03/13/17  Yes Terald Sleeper, PA-C  finasteride (PROSCAR) 5 MG tablet Take 5 mg by mouth daily.  05/06/16  Yes [provider]  fluticasone (FLONASE) 50 MCG/ACT nasal spray Place 2 sprays into both nostrils daily. Patient taking differently: Place 2 sprays into both nostrils daily as needed for allergies.  07/18/17  Yes Terald Sleeper, PA-C  HYDROcodone-homatropine (HYCODAN) 5-1.5 MG/5ML syrup Take 5-10 mLs by mouth every 6 (six) hours as needed for cough. 11/06/17  Yes Hawks, Christy A, FNP  ibuprofen (ADVIL,MOTRIN) 800 MG tablet Take 1 tablet (800 mg total) by mouth 3 (three) times daily. Patient taking differently: Take 800 mg by mouth 3 (three) times daily as needed for moderate pain.  11/28/17  Yes Terald Sleeper, PA-C  ipratropium-albuterol (DUONEB) 0.5-2.5 (3) MG/3ML SOLN Take 3 mLs by nebulization every 6 (six) hours as needed (shortness of breath).   Yes [provider]  omeprazole (PRILOSEC) 20 MG  capsule Take 1 capsule (20 mg total) by mouth daily. (Needs to be seen before next refill) 12/02/17  Yes Terald Sleeper, PA-C  sacubitril-valsartan (ENTRESTO) 97-103 MG Take 1 tablet by mouth 2 (two) times daily. 09/30/17  Yes Jerline Pain, MD  simvastatin (ZOCOR) 40 MG tablet TAKE 1 TABLET EVERY EVENING Patient taking differently: Take 40 mg by mouth every evening.  06/02/17  Yes Terald Sleeper, PA-C  spironolactone (ALDACTONE) 25 MG tablet Take 1 tablet (25 mg total) by mouth daily. 09/30/17 01/05/18 Yes Jerline Pain, MD  tamsulosin (FLOMAX) 0.4 MG CAPS capsule TAKE 1 CAPSULE 30 MINUTES AFTER THE [place] DAY Patient taking differently: Take 0.4 mg by mouth daily after supper.  03/18/17  Yes Terald Sleeper, PA-C  azithromycin (ZITHROMAX Z-PAK) 250 MG tablet As directed Patient not taking: Reported on 01/05/2018 11/06/17   Sharion Balloon, FNP    Physical Exam: Vitals:   01/06/18 0030 01/06/18 0059 01/06/18 0103 01/06/18 0107  BP: 117/74 123/81 107/75 118/77  Pulse:  86 68 67  Resp:  20 17 19   Temp: 97.6 F (36.4 C)     TempSrc: Oral     SpO2:  94% 95% 96%  Weight: 112.1 kg     Height: 5\' 9"  (1.753 m)       Physical Exam  Constitutional: He is oriented to person, place, and time. He appears well-developed and well-nourished. No distress.  HENT:  Head: Normocephalic.  Mouth/Throat: Oropharynx is clear and moist.  Eyes: Right eye exhibits no discharge. Left eye exhibits no discharge.  Neck: Neck supple.  Cardiovascular: Normal rate, regular rhythm and intact distal pulses.  Pulmonary/Chest: Effort normal and breath sounds normal. No respiratory distress. He has no wheezes. He has no rales.  Abdominal: Soft. Bowel sounds are normal. He exhibits no distension. There is no abdominal tenderness. There is no guarding.  Musculoskeletal:        General: No edema.  Neurological: He is alert and oriented to person, place, and time.  Speech clear Tongue midline No facial  droop Strength intact throughout. Sensation to light touch intact throughout.  Skin: Skin is warm and dry. He is not diaphoretic.     Labs on Admission: I have personally reviewed following labs and imaging studies  CBC: Recent Labs  Lab 01/05/18 1956  WBC 12.2*  HGB 15.8  HCT 48.8  MCV 94.0  PLT 623   Basic Metabolic Panel: Recent Labs  Lab 01/05/18 1956  NA 141  K 4.3  CL 110  CO2 20*  GLUCOSE 129*  BUN 23  CREATININE 1.04  CALCIUM 9.1   GFR: Estimated Creatinine Clearance: 82.8 mL/min (by C-G formula based on SCr of 1.04 mg/dL). Liver Function Tests: No results for input(s): AST, ALT, ALKPHOS, BILITOT, PROT, ALBUMIN in the last 168 hours. No results for input(s): LIPASE, AMYLASE in the last 168 hours. No results for input(s): AMMONIA in the last 168 hours. Coagulation Profile: No results for input(s): INR, PROTIME in the last 168 hours. Cardiac Enzymes: Recent Labs  Lab 01/05/18 2337  TROPONINI 0.04*   BNP (last 3 results) No results for input(s): PROBNP in the last 8760 hours. HbA1C: No results for input(s): HGBA1C in the last 72 hours. CBG: No results for input(s): GLUCAP in the last 168 hours. Lipid Profile: No results for input(s): CHOL, HDL, LDLCALC, TRIG, CHOLHDL, LDLDIRECT in the last 72 hours. Thyroid Function Tests: No results for input(s): TSH, T4TOTAL, FREET4, T3FREE, THYROIDAB in the last 72 hours. Anemia Panel: No results for input(s): VITAMINB12, FOLATE, FERRITIN, TIBC, IRON, RETICCTPCT in the last 72 hours. Urine analysis:    Component Value Date/Time   COLORURINE YELLOW 01/20/2017 0859   APPEARANCEUR CLEAR 01/20/2017 0859   LABSPEC 1.021 01/20/2017 0859   PHURINE 5.0 01/20/2017 0859   GLUCOSEU NEGATIVE 01/20/2017 0859   HGBUR NEGATIVE 01/20/2017 0859   BILIRUBINUR NEGATIVE 01/20/2017 0859   KETONESUR NEGATIVE 01/20/2017 0859   PROTEINUR NEGATIVE 01/20/2017 0859   NITRITE NEGATIVE 01/20/2017 0859   LEUKOCYTESUR TRACE (A)  01/20/2017 0859    Radiological Exams on Admission: Dg Chest Port 1 View  Result Date: 01/05/2018 CLINICAL DATA:  Central chest pain, possible STEMI EXAM: PORTABLE CHEST 1 VIEW  COMPARISON:  01/20/2017 FINDINGS: Cardiac shadow is stable. Aortic calcifications are again seen. Lungs are well aerated bilaterally. No focal infiltrate or effusion is seen. No acute bony abnormality is noted. No pneumothorax is noted. IMPRESSION: No acute abnormality seen. Aortic Atherosclerosis (ICD10-I70.0). Electronically Signed   By: Inez Catalina M.D.   On: 01/05/2018 20:27    EKG: Independently reviewed.  Sinus rhythm, PVCs, questionable ST elevation in lead V2, nonspecific T wave abnormality.  Assessment/Plan Principal Problem:   Near syncope Active Problems:   CAD (coronary artery disease)   GERD (gastroesophageal reflux disease)   Dyslipidemia   Dilated cardiomyopathy (HCC)   Hypotension   Cough   HTN (hypertension)   BPH (benign prostatic hyperplasia)  Near syncope, hypotension Systolic blood pressure 80 when EMS arrived.  Received 1 L IV fluid bolus.  Currently hemodynamically stable.  Orthostatics negative.  White count mildly elevated at 12.2, however, patient is afebrile.  Chest x-ray not suggestive of pneumonia.  He is not endorsing any UTI symptoms. I-STAT troponin 0.07.  Repeat troponin I 0.04.  EKG with questionable ST elevation in lead V2 and nonspecific T wave abnormality. ED physician discussed the case with Dr. Ellyn Hack (STEMI cards) who did not think patient was having a STEMI.  Patient denies having any chest pain. He is on Coreg, Entresto, and spironolactone which are likely contributing to hypotension. -Monitor on telemetry -Continue to trend troponin -Gentle IV fluid hydration (history of cardiomyopathy).  Currently not volume overloaded. -Echocardiogram -Repeat CBC in a.m. -Hold home Coreg, Entresto, spironolactone  Cough Likely secondary to viral URI.  Patient reports having a  productive cough.  White count mildly elevated but he is afebrile and nontoxic-appearing.  Chest x-ray without acute findings. -Mucinex DM  History of CAD, dilated cardiomyopathy Patient seen by Dr. Marlou Porch for CAD status post anterior MI in 2005.  He had an EF then of 25% which has improved to 30 to 35%.  He had refused an ICD. -Continue home aspirin -Hold home Coreg, Entresto, spironolactone at this time as patient was found to be hypotensive by EMS.  Hypertension -Hold home Coreg, Entresto, spironolactone at this time.  BPH -Continue home finasteride, Flomax  Hyperlipidemia -Continue statin  GERD -Continue PPI  DVT prophylaxis: Lovenox Code Status: Patient wishes to be full code. Family Communication: Wife and daughter at bedside. Disposition Plan: Anticipate discharge after clinical improvement. Consults called: None Admission status: Observation   Shela Leff MD [facility] Pager [phone]  If 7PM-7AM, please contact night-coverage [email or web address] Password [email or web address]  01/06/2018, 4:00 AM

## 2018-01-05 NOTE — ED Provider Notes (Signed)
[facility] EMERGENCY DEPARTMENT Provider Note   CSN: [id number] Arrival date & time: 01/05/18  1947     History   Chief Complaint Chief Complaint  Patient presents with  . Code STEMI    HPI Tony Patrick is a 67 y.o. male.  Pt presents to the ED today with CP.  He had a sudden onset of CP and diaphoresis.  When EMS arrived, initial SBP 80.  EMS called a code STEMI.  He was given ASA en route.  BP initially low, so he was not given any nitro.  CP gone now.  The pt's EKG looks similar to previous EKGs.  Dr. Ellyn Hack (STEMI cards) did not think he was a STEMI as there was no new elevation, so code stemi was cancelled.  Pt sees Dr. Marlou Porch for CAD s/p anterior MI in 2005.  He had an EF then of 25% which has improved to 30-35%.  He has refused an ICD.    His wife gives additional history.  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  He went outside and started sweating.  He told her that he was dizzy.  She describes what seems to be a possible syncopal episode where he did not seem to know her and was making a weird moaning noise.  When he came to, he c/o cp.     Past Medical History:  Diagnosis Date  . Arthritis   . Atherosclerotic plaque   . Basal cell carcinoma   . CAD (coronary artery disease)    Anterior MI, [facility], 1995, no PCI, total LAD  / catheterization 2001, 30% LAD beyond the origin of a large diagonal, circumflex normal, RCA normal, anterolateral and apical  akinesis, ejection fraction 25-35% range  . Dyslipidemia   . Ejection fraction < 50%    EF 25-35%, catheterization 2001 / EF 20-25%, global hypokinesis, echo in June, 2012  . GERD (gastroesophageal reflux disease)   . History of bronchitis   . Hypoxia    Pneumonia, hospitalization, June, 2012  . kidney stones   . MI (myocardial infarction) ([place]) 1995  . Pneumonia 2012  . S/P colectomy    Benign tumor    Patient Active Problem List   Diagnosis Date Noted  . Hx of total knee arthroplasty, right 01/29/2017  . Achilles tendinitis of right lower extremity 11/21/2016  . Dilated cardiomyopathy ([place]) 05/22/2016  . History of MI (myocardial infarction) 05/22/2016  . Benign prostatic hyperplasia with urinary frequency 05/16/2016  . Tear of rotator cuff 05/26/2015  . Shoulder injury 04/06/2015  . Rotator cuff syndrome 04/06/2015  . Special screening for malignant neoplasms, colon   . CAD (coronary artery disease)   . GERD (gastroesophageal reflux disease)   . Dyslipidemia   . Ejection fraction < 50%   . Hypoxia     Past Surgical History:  Procedure Laterality Date  . CARDIAC CATHETERIZATION     Patient states he has not had cardiac cath  . CHEST TUBE INSERTION    . COLECTOMY  1981   benign mass  . COLONOSCOPY WITH PROPOFOL N/A 07/04/2014   Procedure: COLONOSCOPY WITH PROPOFOL;  Surgeon: Gatha Mayer, MD;  [facility];  Service: Endoscopy;  Laterality: N/A;  . JOINT REPLACEMENT    . KNEE ARTHROSCOPY Left 07/19/2015   Procedure: LEFT ARTHROSCOPY KNEE WITH MEDIAL MENISCECTOMY;  Surgeon: Latanya Maudlin, MD;  Location: [facility];  Service: Orthopedics;  Laterality: Left;  LMA  . LEG SURGERY Left    cyst  . PLEURAL SCARIFICATION  1978  . SHOULDER OPEN ROTATOR CUFF REPAIR Left 05/26/2015   Procedure: LEFT OPEN ACROMINECTOMY AND REPAIR WITH GRAFT AND ANCHOR ROTATOR CUFF TEAR ;  Surgeon: Latanya Maudlin, MD;  Location: [facility];  Service: Orthopedics;  Laterality: Left;  Left shoulder block in holding  . TOTAL KNEE ARTHROPLASTY Left 01/29/2017   Procedure: LEFT TOTAL KNEE ARTHROPLASTY;  Surgeon: Latanya Maudlin, MD;  Location: [facility];  Service: Orthopedics;  Laterality: Left;  Adductor Block        Home Medications    Prior to Admission medications   Medication Sig Start Date End Date Taking? Authorizing Provider  albuterol (PROVENTIL HFA;VENTOLIN HFA) 108 (90 Base) MCG/ACT inhaler Inhale 1-2 puffs into the lungs  every 6 (six) hours as needed for wheezing. 03/04/16   Eustaquio Maize, MD  aspirin 81 MG chewable tablet Chew 81 mg by mouth daily.    [provider]  azithromycin (ZITHROMAX Z-PAK) 250 MG tablet As directed 11/06/17   Evelina Dun A, FNP  carvedilol (COREG) 25 MG tablet Take 1 tablet (25 mg total) by mouth 2 (two) times daily. 09/30/17   Jerline Pain, MD  cetirizine (ZYRTEC) 10 MG tablet TAKE 1 TABLET DAILY 11/14/17   Terald Sleeper, PA-C  diclofenac sodium (VOLTAREN) 1 % GEL Apply 1 application topically 3 (three) times daily as needed (for hands). 03/13/17   Terald Sleeper, PA-C  finasteride (PROSCAR) 5 MG tablet Take 5 mg by mouth daily.  05/06/16   [provider]  fluticasone (FLONASE) 50 MCG/ACT nasal spray Place 2 sprays into both nostrils daily. 07/18/17   Terald Sleeper, PA-C  HYDROcodone-homatropine (HYCODAN) 5-1.5 MG/5ML syrup Take 5-10 mLs by mouth every 6 (six) hours as needed for cough. 11/06/17   Sharion Balloon, FNP  ibuprofen (ADVIL,MOTRIN) 800 MG tablet Take 1 tablet (800 mg total) by mouth 3 (three) times daily. 11/28/17   Terald Sleeper, PA-C  ipratropium-albuterol (DUONEB) 0.5-2.5 (3) MG/3ML SOLN Take 3 mLs by nebulization every 6 (six) hours as needed (shortness of breath).    [provider]  omeprazole (PRILOSEC) 20 MG capsule Take 1 capsule (20 mg total) by mouth daily. (Needs to be seen before next refill) 12/02/17   Terald Sleeper, PA-C  sacubitril-valsartan (ENTRESTO) 97-103 MG Take 1 tablet by mouth 2 (two) times daily. 09/30/17   Jerline Pain, MD  simvastatin (ZOCOR) 40 MG tablet TAKE 1 TABLET EVERY EVENING 06/02/17   Terald Sleeper, PA-C  spironolactone (ALDACTONE) 25 MG tablet Take 1 tablet (25 mg total) by mouth daily. 09/30/17 12/29/17  Jerline Pain, MD  tamsulosin (FLOMAX) 0.4 MG CAPS capsule TAKE 1 CAPSULE 30 MINUTES AFTER THE [facility] EACH DAY 03/18/17   Terald Sleeper, PA-C    Family History Family History  Problem Relation Age of  Onset  . Diabetes Mother   . Hypertension Mother   . Cancer Maternal Grandmother        type unknown  . Melanoma Brother   . Cancer Brother        type unknown  . Prostatitis Brother     Social History Social History   Tobacco Use  . Smoking status: Former Smoker    Packs/day: 2.00    Years: 32.00    Pack years: 64.00    Types: Cigarettes    Last attempt to quit: 04/14/1992  Years since quitting: 25.7  . Smokeless tobacco: Never Used  Substance Use Topics  . Alcohol use: No  . Drug use: No     Allergies   Ivp dye [iodinated diagnostic agents] and Morphine and related   Review of Systems Review of Systems  Cardiovascular: Positive for chest pain.  All other systems reviewed and are negative.    Physical Exam Updated Vital Signs BP 114/81   Pulse 74   Temp 97.7 F (36.5 C) (Oral)   Resp 14   Ht 5\' 9"  (1.753 m)   Wt 111.1 kg   SpO2 98%   BMI 36.18 kg/m   Physical Exam Vitals signs and nursing note reviewed.  Constitutional:      Appearance: Normal appearance. He is normal weight.  HENT:     Head: Normocephalic and atraumatic.     Right Ear: External ear normal.     Left Ear: External ear normal.     Nose: Nose normal.     Mouth/Throat:     Mouth: Mucous membranes are moist.     Pharynx: Oropharynx is clear.  Eyes:     Extraocular Movements: Extraocular movements intact.     Conjunctiva/sclera: Conjunctivae normal.     Pupils: Pupils are equal, round, and reactive to light.  Neck:     Musculoskeletal: Normal range of motion.  Cardiovascular:     Rate and Rhythm: Normal rate and regular rhythm.     Pulses: Normal pulses.     Heart sounds: Normal heart sounds.  Pulmonary:     Effort: Pulmonary effort is normal.     Breath sounds: Normal breath sounds.  Abdominal:     General: Abdomen is flat. Bowel sounds are normal.     Palpations: Abdomen is soft.  Musculoskeletal: Normal range of motion.  Skin:    General: Skin is warm and dry.      Capillary Refill: Capillary refill takes less than 2 seconds.  Neurological:     General: No focal deficit present.     Mental Status: He is alert and oriented to person, place, and time.  Psychiatric:        Mood and Affect: Mood normal.        Behavior: Behavior normal.        Thought Content: Thought content normal.        Judgment: Judgment normal.      ED Treatments / Results  Labs (all labs ordered are listed, but only abnormal results are displayed) Labs Reviewed  BASIC METABOLIC PANEL - Abnormal; Notable for the following components:      Result Value   CO2 20 (*)    Glucose, Bld 129 (*)    All other components within normal limits  CBC - Abnormal; Notable for the following components:   WBC 12.2 (*)    All other components within normal limits  I-STAT TROPONIN, ED    EKG EKG Interpretation  Date/Time:  Monday January 05 2018 20:24:13 EST Ventricular Rate:  81 PR Interval:    QRS Duration: 145 QT Interval:  402 QTC Calculation: 467 R Axis:   -67 Text Interpretation:  Sinus rhythm Ventricular premature complex IVCD, consider atypical RBBB Probable lateral infarct, age indeterminate Anteroseptal infarct, age indeterminate Confirmed by Isla Pence ([id number]) on 01/05/2018 8:32:12 PM   Radiology Dg Chest Port 1 View  Result Date: 01/05/2018 CLINICAL DATA:  Central chest pain, possible STEMI EXAM: PORTABLE CHEST 1 VIEW COMPARISON:  01/20/2017 FINDINGS: Cardiac shadow is stable.  Aortic calcifications are again seen. Lungs are well aerated bilaterally. No focal infiltrate or effusion is seen. No acute bony abnormality is noted. No pneumothorax is noted. IMPRESSION: No acute abnormality seen. Aortic Atherosclerosis (ICD10-I70.0). Electronically Signed   By: Inez Catalina M.D.   On: 01/05/2018 20:27    Procedures Procedures (including critical care time)  Medications Ordered in ED Medications  sodium chloride 0.9 % bolus 1,000 mL (1,000 mLs Intravenous New  Bag/Given 01/05/18 2109)     Initial Impression / Assessment and Plan / ED Course  I have reviewed the triage vital signs and the nursing notes.  Pertinent labs & imaging results that were available during my care of the patient were reviewed by me and considered in my medical decision making (see chart for details).    Pt has not had any cp while here.  He did get asa by EMS.  I am wondering if he had an arrhythmic event at home since he has a low EF and has refused an ICD.  Pt d/w Dr. Marlowe Sax ([facility]) for admission.  Final Clinical Impressions(s) / ED Diagnoses   Final diagnoses:  Near syncope  Chest pain, unspecified type    ED Discharge Orders    None       Isla Pence, MD 01/05/18 2113

## 2018-01-06 ENCOUNTER — Observation Stay (HOSPITAL_BASED_OUTPATIENT_CLINIC_OR_DEPARTMENT_OTHER): Payer: Medicare Other

## 2018-01-06 ENCOUNTER — Other Ambulatory Visit: Payer: Self-pay

## 2018-01-06 ENCOUNTER — Encounter (HOSPITAL_COMMUNITY): Payer: Self-pay | Admitting: *Deleted

## 2018-01-06 DIAGNOSIS — I2510 Atherosclerotic heart disease of native coronary artery without angina pectoris: Secondary | ICD-10-CM | POA: Diagnosis not present

## 2018-01-06 DIAGNOSIS — I10 Essential (primary) hypertension: Secondary | ICD-10-CM | POA: Diagnosis not present

## 2018-01-06 DIAGNOSIS — R079 Chest pain, unspecified: Secondary | ICD-10-CM | POA: Diagnosis not present

## 2018-01-06 DIAGNOSIS — Z79899 Other long term (current) drug therapy: Secondary | ICD-10-CM | POA: Diagnosis not present

## 2018-01-06 DIAGNOSIS — Z7982 Long term (current) use of aspirin: Secondary | ICD-10-CM | POA: Diagnosis not present

## 2018-01-06 DIAGNOSIS — I370 Nonrheumatic pulmonary valve stenosis: Secondary | ICD-10-CM | POA: Diagnosis not present

## 2018-01-06 DIAGNOSIS — R55 Syncope and collapse: Secondary | ICD-10-CM | POA: Diagnosis not present

## 2018-01-06 DIAGNOSIS — R059 Cough, unspecified: Secondary | ICD-10-CM

## 2018-01-06 DIAGNOSIS — R05 Cough: Secondary | ICD-10-CM | POA: Diagnosis not present

## 2018-01-06 DIAGNOSIS — I252 Old myocardial infarction: Secondary | ICD-10-CM | POA: Diagnosis not present

## 2018-01-06 DIAGNOSIS — E785 Hyperlipidemia, unspecified: Secondary | ICD-10-CM

## 2018-01-06 DIAGNOSIS — N400 Enlarged prostate without lower urinary tract symptoms: Secondary | ICD-10-CM

## 2018-01-06 DIAGNOSIS — I420 Dilated cardiomyopathy: Secondary | ICD-10-CM | POA: Diagnosis not present

## 2018-01-06 DIAGNOSIS — I959 Hypotension, unspecified: Secondary | ICD-10-CM

## 2018-01-06 DIAGNOSIS — K219 Gastro-esophageal reflux disease without esophagitis: Secondary | ICD-10-CM | POA: Diagnosis not present

## 2018-01-06 LAB — CBC
HEMATOCRIT: 44.6 % (ref 39.0–52.0)
HEMOGLOBIN: 14.1 g/dL (ref 13.0–17.0)
MCH: 29.1 pg (ref 26.0–34.0)
MCHC: 31.6 g/dL (ref 30.0–36.0)
MCV: 92.1 fL (ref 80.0–100.0)
Platelets: 159 10*3/uL (ref 150–400)
RBC: 4.84 MIL/uL (ref 4.22–5.81)
RDW: 13.8 % (ref 11.5–15.5)
WBC: 9.4 10*3/uL (ref 4.0–10.5)
nRBC: 0 % (ref 0.0–0.2)

## 2018-01-06 LAB — TROPONIN I
TROPONIN I: 0.04 ng/mL — AB (ref <0.03)
Troponin I: 0.04 ng/mL (ref <0.03)
Troponin I: 0.03 ng/mL (ref <0.03)

## 2018-01-06 LAB — ECHOCARDIOGRAM COMPLETE
Height: 69 in
Weight: 3954.17 oz

## 2018-01-06 LAB — HIV ANTIBODY (ROUTINE TESTING W REFLEX): HIV Screen 4th Generation wRfx: NONREACTIVE

## 2018-01-06 MED ORDER — SACUBITRIL-VALSARTAN 49-51 MG PO TABS
1.0000 | ORAL_TABLET | Freq: Two times a day (BID) | ORAL | Status: DC
  Filled 2018-01-06: qty 1

## 2018-01-06 MED ORDER — SACUBITRIL-VALSARTAN 49-51 MG PO TABS: 1.0000 | ORAL_TABLET | Freq: Two times a day (BID) | ORAL | 0 refills | Status: DC

## 2018-01-06 MED ORDER — PERFLUTREN LIPID MICROSPHERE
INTRAVENOUS | Status: AC
  Administered 2018-01-06: 11:00:00
  Filled 2018-01-06: qty 10

## 2018-01-06 NOTE — Discharge Summary (Signed)
Physician Discharge Summary  Tony Patrick SEG:[id number] DOB: [birth year] DOA: 01/05/2018  PCP: Terald Sleeper, PA-C  Admit date: 01/05/2018 Discharge date: 01/06/2018  Admitted From: Home Disposition:  Home  Recommendations for Outpatient Follow-up:  1. Follow up with PCP in 1-2 weeks 2. Follow up with Dr. Marlou Porch as scheduled  Discharge Condition:Stable CODE STATUS:Full Diet recommendation: Heart healthy   Brief/Interim Summary: 67 y.o. male with medical history significant of CAD and prior MI, hyperlipidemia, cardiomyopathy, GERD presenting to the hospital as code STEMI for evaluation of chest pain.  When EMS arrived, systolic blood pressure 80.  EMS called code STEMI.  He was given aspirin in route.  EKG without acute changes.  ED physician discussed the case with Dr. Ellyn Hack (STEMI cards) who did not think patient was having a STEMI and so code STEMI was canceled.    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  States after returning from work today he was sitting and texting on his phone when all of a sudden he felt hot and started sweating.  He felt like he could not breathe so he went outside to his porch to get fresh air.  His wife was there with him.  He felt like he was going to blackout but did not lose consciousness.  Denies having any chest pain.  Wife at bedside confirmed that patient did not lose consciousness.  Stated that patient slumped over in a chair a few times and was making groaning sounds but was awake and alert at that time and answering her questions. She did not notice any focal weakness.  Patient reports having a productive cough.  ED Course: Hemodynamically stable.  Afebrile.  White count 12.2.  I-STAT troponin 0.07.  EKG not suggestive of ACS.  Chest x-ray without acute abnormality.  Patient received 1 L normal saline bolus in the ED.  Discharge Diagnoses:  Principal Problem:   Near syncope Active Problems:   CAD (coronary artery disease)   GERD  (gastroesophageal reflux disease)   Dyslipidemia   Dilated cardiomyopathy (HCC)   Hypotension   Cough   HTN (hypertension)   BPH (benign prostatic hyperplasia)  Near syncope, hypotension -Presenting sbp in the 80's in the field -Entresto dose increased in 7/19 -BP now much improved s/p 1L bolus, BP has since remained stable -Stable on tele -2d echo performed and reviewed, EF of 40-45%, was in the 30% range before. -Patient has since remained stable this hospital course -Discussed case with Cardiology. Recommendation to decrease entresto dose to 49-51 dose and have pt f/u with primary Cardiologist on d/c -Cardiology recommends continuing current coreg and spironolactone  Cough -Remained stable this visit  History of CAD, dilated cardiomyopathy -Patient seen by Dr. Marlou Porch for CAD status post anterior MI in 2005.  He had an EF then of 25% which has improved to 30 to 35%.  He had refused an ICD. -Continue home aspirin -Per above, Cardiology has recommended continuing coreg, spironolactone, but decrease Entresto dose given presenting hypotension  Hypertension -Cont meds per above  BPH -Continue home finasteride, Flomax  Hyperlipidemia -Continue statin  GERD -Continue PPI   Discharge Instructions   Allergies as of 01/06/2018      Reactions   Ivp Dye [iodinated Diagnostic Agents] Swelling, Other (See Comments)   Arrest   Morphine And Related Nausea And Vomiting      Medication List    STOP taking these medications   azithromycin 250 MG tablet Commonly known as:  ZITHROMAX Z-PAK   sacubitril-valsartan  97-103 MG Commonly known as:  ENTRESTO Replaced by:  sacubitril-valsartan 49-51 MG     TAKE these medications   albuterol 108 (90 Base) MCG/ACT inhaler Commonly known as:  PROVENTIL HFA;VENTOLIN HFA Inhale 1-2 puffs into the lungs every 6 (six) hours as needed for wheezing.   aspirin 81 MG chewable tablet Chew 81 mg by mouth daily.   carvedilol 25 MG  tablet Commonly known as:  COREG Take 1 tablet (25 mg total) by mouth 2 (two) times daily.   cetirizine 10 MG tablet Commonly known as:  ZYRTEC TAKE 1 TABLET DAILY   diclofenac sodium 1 % Gel Commonly known as:  VOLTAREN Apply 1 application topically 3 (three) times daily as needed (for hands).   finasteride 5 MG tablet Commonly known as:  PROSCAR Take 5 mg by mouth daily.   fluticasone 50 MCG/ACT nasal spray Commonly known as:  FLONASE Place 2 sprays into both nostrils daily. What changed:    when to take this  reasons to take this   HYDROcodone-homatropine 5-1.5 MG/5ML syrup Commonly known as:  HYCODAN Take 5-10 mLs by mouth every 6 (six) hours as needed for cough.   ibuprofen 800 MG tablet Commonly known as:  ADVIL,MOTRIN Take 1 tablet (800 mg total) by mouth 3 (three) times daily. What changed:    when to take this  reasons to take this   ipratropium-albuterol 0.5-2.5 (3) MG/3ML Soln Commonly known as:  DUONEB Take 3 mLs by nebulization every 6 (six) hours as needed (shortness of breath).   omeprazole 20 MG capsule Commonly known as:  PRILOSEC Take 1 capsule (20 mg total) by mouth daily. (Needs to be seen before next refill)   sacubitril-valsartan 49-51 MG Commonly known as:  ENTRESTO Take 1 tablet by mouth 2 (two) times daily. Replaces:  sacubitril-valsartan 97-103 MG   simvastatin 40 MG tablet Commonly known as:  ZOCOR TAKE 1 TABLET EVERY EVENING   spironolactone 25 MG tablet Commonly known as:  ALDACTONE Take 1 tablet (25 mg total) by mouth daily.   tamsulosin 0.4 MG Caps capsule Commonly known as:  FLOMAX TAKE 1 CAPSULE 30 MINUTES AFTER THE [facility] EACH DAY What changed:  See the new instructions.      Follow-up Information    Terald Sleeper, PA-C. Schedule an appointment as soon as possible for a visit in 2 week(s).   Specialties:  Physician Assistant, Family Medicine Contact information: [address] [place]  [address] [phone]        Jerline Pain, MD. Schedule an appointment as soon as possible for a visit.   Specialty:  Cardiology Contact information: [address] [place] [address] [phone]          Allergies  Allergen Reactions  . Ivp Dye [Iodinated Diagnostic Agents] Swelling and Other (See Comments)    Arrest  . Morphine And Related Nausea And Vomiting    Consultations:  Discussed case with Cardiology over phone  Procedures/Studies: Dg Chest Port 1 View  Result Date: 01/05/2018 CLINICAL DATA:  Central chest pain, possible STEMI EXAM: PORTABLE CHEST 1 VIEW COMPARISON:  01/20/2017 FINDINGS: Cardiac shadow is stable. Aortic calcifications are again seen. Lungs are well aerated bilaterally. No focal infiltrate or effusion is seen. No acute bony abnormality is noted. No pneumothorax is noted. IMPRESSION: No acute abnormality seen. Aortic Atherosclerosis (ICD10-I70.0). Electronically Signed   By: Inez Catalina M.D.   On: 01/05/2018 20:27     Subjective: Eager to go home  Discharge Exam: Vitals:   01/06/18 1058 01/06/18 1528  BP: 131/78 (!) 136/91  Pulse: 69 67  Resp: 17 19  Temp: 98.2 F (36.8 C) 98 F (36.7 C)  SpO2: 98% 100%   Vitals:   01/06/18 0700 01/06/18 1005 01/06/18 1058 01/06/18 1528  BP: 112/77 134/81 131/78 (!) 136/91  Pulse: 62  69 67  Resp: 17  17 19   Temp: 98 F (36.7 C) (!) 97.3 F (36.3 C) 98.2 F (36.8 C) 98 F (36.7 C)  TempSrc: Oral Oral Oral Oral  SpO2: 94%  98% 100%  Weight:      Height:        General: Pt is alert, awake, not in acute distress Cardiovascular: RRR, S1/S2 +, no rubs, no gallops Respiratory: CTA bilaterally, no wheezing, no rhonchi Abdominal: Soft, NT, ND, bowel sounds + Extremities: no edema, no cyanosis   The results of significant diagnostics from this hospitalization (including imaging, microbiology, ancillary and laboratory) are listed below for reference.     Microbiology: No  results found for this or any previous visit (from the past 240 hour(s)).   Labs: BNP (last 3 results) No results for input(s): BNP in the last 8760 hours. Basic Metabolic Panel: Recent Labs  Lab 01/05/18 1956  NA 141  K 4.3  CL 110  CO2 20*  GLUCOSE 129*  BUN 23  CREATININE 1.04  CALCIUM 9.1   Liver Function Tests: No results for input(s): AST, ALT, ALKPHOS, BILITOT, PROT, ALBUMIN in the last 168 hours. No results for input(s): LIPASE, AMYLASE in the last 168 hours. No results for input(s): AMMONIA in the last 168 hours. CBC: Recent Labs  Lab 01/05/18 1956 01/06/18 0638  WBC 12.2* 9.4  HGB 15.8 14.1  HCT 48.8 44.6  MCV 94.0 92.1  PLT 192 159   Cardiac Enzymes: Recent Labs  Lab 01/05/18 2337 01/06/18 0638 01/06/18 1044  TROPONINI 0.04* 0.04* 0.03*   BNP: Invalid input(s): POCBNP CBG: No results for input(s): GLUCAP in the last 168 hours. D-Dimer No results for input(s): DDIMER in the last 72 hours. Hgb A1c No results for input(s): HGBA1C in the last 72 hours. Lipid Profile No results for input(s): CHOL, HDL, LDLCALC, TRIG, CHOLHDL, LDLDIRECT in the last 72 hours. Thyroid function studies No results for input(s): TSH, T4TOTAL, T3FREE, THYROIDAB in the last 72 hours.  Invalid input(s): FREET3 Anemia work up No results for input(s): VITAMINB12, FOLATE, FERRITIN, TIBC, IRON, RETICCTPCT in the last 72 hours. Urinalysis    Component Value Date/Time   COLORURINE YELLOW 01/20/2017 0859   APPEARANCEUR CLEAR 01/20/2017 0859   LABSPEC 1.021 01/20/2017 0859   PHURINE 5.0 01/20/2017 0859   GLUCOSEU NEGATIVE 01/20/2017 0859   HGBUR NEGATIVE 01/20/2017 0859   BILIRUBINUR NEGATIVE 01/20/2017 0859   KETONESUR NEGATIVE 01/20/2017 0859   PROTEINUR NEGATIVE 01/20/2017 0859   NITRITE NEGATIVE 01/20/2017 0859   LEUKOCYTESUR TRACE (A) 01/20/2017 0859   Sepsis Labs Invalid input(s): PROCALCITONIN,  WBC,  LACTICIDVEN Microbiology No results found for this or any  previous visit (from the past 240 hour(s)).  Time spent: 30 min  SIGNED:   Marylu Lund, MD  [facility] 01/06/2018, 4:37 PM  If 7PM-7AM, please contact night-coverage

## 2018-01-06 NOTE — Progress Notes (Signed)
Pt provided with verbal d/c instructions. Patient provide with paper copy and prescription for Entresto. No further questions. VSS at d/c. IV's removed per orders. Pt belongings sent with patient. Pt d/c by wheelchair through front entrance by wheelchair.

## 2018-01-06 NOTE — Care Management Obs Status (Signed)
[facility] NOTIFICATION   Patient Details  Name: Tony Patrick MRN: [id number] Date of Birth: 06/28/1950   Medicare Observation Status Notification Given:  Yes    Midge Minium RN, BSN, NCM-BC, ACM-RN [phone] 01/06/2018, 3:16 PM

## 2018-01-06 NOTE — Plan of Care (Signed)
  Problem: Clinical Measurements: Goal: Will remain free from infection Outcome: Progressing Goal: Diagnostic test results will improve Outcome: Progressing Goal: Respiratory complications will improve Outcome: Progressing Goal: Cardiovascular complication will be avoided Outcome: Progressing   Problem: Nutrition: Goal: Adequate nutrition will be maintained Outcome: Progressing   Problem: Elimination: Goal: Will not experience complications related to urinary retention Outcome: Progressing

## 2018-01-06 NOTE — Progress Notes (Signed)
  Echocardiogram 2D Echocardiogram has been performed.  Tony Patrick 01/06/2018, 9:59 AM

## 2018-01-27 DIAGNOSIS — H01022 Squamous blepharitis right lower eyelid: Secondary | ICD-10-CM | POA: Diagnosis not present

## 2018-01-27 DIAGNOSIS — H01021 Squamous blepharitis right upper eyelid: Secondary | ICD-10-CM | POA: Diagnosis not present

## 2018-01-27 DIAGNOSIS — H2513 Age-related nuclear cataract, bilateral: Secondary | ICD-10-CM | POA: Diagnosis not present

## 2018-01-27 DIAGNOSIS — H01025 Squamous blepharitis left lower eyelid: Secondary | ICD-10-CM | POA: Diagnosis not present

## 2018-01-27 DIAGNOSIS — H01024 Squamous blepharitis left upper eyelid: Secondary | ICD-10-CM | POA: Diagnosis not present

## 2018-02-04 ENCOUNTER — Ambulatory Visit (INDEPENDENT_AMBULATORY_CARE_PROVIDER_SITE_OTHER): Payer: Medicare Other | Admitting: Nurse Practitioner

## 2018-02-04 ENCOUNTER — Encounter: Payer: Self-pay | Admitting: Nurse Practitioner

## 2018-02-04 ENCOUNTER — Encounter

## 2018-02-04 VITALS — BP 126/68 | HR 73 | Ht 69.0 in | Wt 254.8 lb

## 2018-02-04 DIAGNOSIS — I5022 Chronic systolic (congestive) heart failure: Secondary | ICD-10-CM

## 2018-02-04 DIAGNOSIS — R55 Syncope and collapse: Secondary | ICD-10-CM

## 2018-02-04 DIAGNOSIS — I255 Ischemic cardiomyopathy: Secondary | ICD-10-CM

## 2018-02-04 DIAGNOSIS — I420 Dilated cardiomyopathy: Secondary | ICD-10-CM

## 2018-02-04 MED ORDER — SACUBITRIL-VALSARTAN 49-51 MG PO TABS: 1.0000 | ORAL_TABLET | Freq: Two times a day (BID) | ORAL | 3 refills | Status: DC

## 2018-02-04 MED ORDER — SACUBITRIL-VALSARTAN 49-51 MG PO TABS: 1.0000 | ORAL_TABLET | Freq: Two times a day (BID) | ORAL | 3 refills | Status: AC

## 2018-02-04 NOTE — Progress Notes (Signed)
CARDIOLOGY OFFICE NOTE  Date:  02/04/2018    Tony Patrick Date of Birth: [birth year] Medical Record #[id number]  PCP:  Terald Sleeper, PA-C  Cardiologist:  [facility]  Chief Complaint  Patient presents with  . Follow-up    Seen for Dr. Marlou Porch    History of Present Illness: Tony Patrick is a 68 y.o. male who presents today for a post hospital visit. Seen for Dr. Marlou Porch.   He has a history of CAD with remote anterior MI in 1995, prior EF of 25% - has declined ICD implant despite multiple discussions in the past, multiple orthopedic issues, GERD, HTN and HLD.  He has been placed on Entresto.   Last seen here in August by Cecilie Kicks, NP - has been referred to the HTN clinic since for management of BP.   Presented to the hospital Christmas Eve as code STEMI (cancelled) - BP of 80 systolic. He had syncope x 3. This was felt to be due to [facility] which was cut back. Echo was done and showed EF up to 40 to 45%. Other medicines were continued.   Comes in today. Here alone. Seeing PCP later this week. Feels fine with the reduction in his Entresto. 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   Past Medical History:  Diagnosis Date  . Arthritis   . Atherosclerotic plaque   . Basal cell carcinoma   . CAD (coronary artery disease)    Anterior MI, [facility], 1995, no PCI, total LAD  / catheterization 2001, 30% LAD beyond the origin of a large diagonal, circumflex normal, RCA normal, anterolateral and apical  akinesis, ejection fraction 25-35% range  . Dyslipidemia   . Ejection fraction < 50%    EF 25-35%, catheterization 2001 / EF 20-25%, global hypokinesis, echo in June, 2012  . GERD (gastroesophageal reflux disease)   . History of bronchitis   . Hypoxia    Pneumonia, hospitalization, June, 2012  . kidney stones   . MI (myocardial infarction) ([place]) 1995  . Pneumonia 2012  . S/P colectomy    Benign tumor    Past Surgical History:  Procedure Laterality Date  . CARDIAC  CATHETERIZATION     Patient states he has not had cardiac cath  . CHEST TUBE INSERTION    . COLECTOMY  1981   benign mass  . COLONOSCOPY WITH PROPOFOL N/A 07/04/2014   Procedure: COLONOSCOPY WITH PROPOFOL;  Surgeon: Gatha Mayer, MD;  Location: [facility];  Service: Endoscopy;  Laterality: N/A;  . JOINT REPLACEMENT    . KNEE ARTHROSCOPY Left 07/19/2015   Procedure: LEFT ARTHROSCOPY KNEE WITH MEDIAL MENISCECTOMY;  Surgeon: Latanya Maudlin, MD;  Location: [facility];  Service: Orthopedics;  Laterality: Left;  LMA  . LEG SURGERY Left    cyst  . PLEURAL SCARIFICATION  1978  . SHOULDER OPEN ROTATOR CUFF REPAIR Left 05/26/2015   Procedure: LEFT OPEN ACROMINECTOMY AND REPAIR WITH GRAFT AND ANCHOR ROTATOR CUFF TEAR ;  Surgeon: Latanya Maudlin, MD;  Location: [facility];  Service: Orthopedics;  Laterality: Left;  Left shoulder block in holding  . TOTAL KNEE ARTHROPLASTY Left 01/29/2017   Procedure: LEFT TOTAL KNEE ARTHROPLASTY;  Surgeon: Latanya Maudlin, MD;  Location: [facility];  Service: Orthopedics;  Laterality: Left;  Adductor Block     Medications: Current Meds  Medication Sig  . albuterol (PROVENTIL HFA;VENTOLIN HFA) 108 (90 Base) MCG/ACT inhaler Inhale 1-2 puffs into the lungs every 6 (six) hours as needed for wheezing.  Marland Kitchen  aspirin 81 MG chewable tablet Chew 81 mg by mouth daily.  . carvedilol (COREG) 25 MG tablet Take 1 tablet (25 mg total) by mouth 2 (two) times daily.  . cetirizine (ZYRTEC) 10 MG tablet Take 10 mg by mouth daily.  . diclofenac sodium (VOLTAREN) 1 % GEL Apply 1 application topically 3 (three) times daily as needed (for hands).  . finasteride (PROSCAR) 5 MG tablet Take 5 mg by mouth daily.   . fluticasone (FLONASE) 50 MCG/ACT nasal spray Place 1 spray into both nostrils as needed for allergies or rhinitis.  Marland Kitchen HYDROcodone-homatropine (HYCODAN) 5-1.5 MG/5ML syrup Take 5-10 mLs by mouth every 6 (six) hours as needed for cough.  Marland Kitchen ibuprofen (ADVIL,MOTRIN) 200 MG tablet Take 800 mg by  mouth every 6 (six) hours as needed for moderate pain.  Marland Kitchen ipratropium-albuterol (DUONEB) 0.5-2.5 (3) MG/3ML SOLN Take 3 mLs by nebulization every 6 (six) hours as needed (shortness of breath).  Marland Kitchen omeprazole (PRILOSEC) 20 MG capsule Take 1 capsule (20 mg total) by mouth daily. (Needs to be seen before next refill)  . sacubitril-valsartan (ENTRESTO) 49-51 MG Take 1 tablet by mouth 2 (two) times daily for 30 days.  . simvastatin (ZOCOR) 40 MG tablet Take 40 mg by mouth daily at 6 PM.  . spironolactone (ALDACTONE) 25 MG tablet Take 1 tablet (25 mg total) by mouth daily.  . tamsulosin (FLOMAX) 0.4 MG CAPS capsule Take 0.4 mg by mouth daily after supper.  . [DISCONTINUED] cetirizine (ZYRTEC) 10 MG tablet TAKE 1 TABLET DAILY (Patient taking differently: Take 10 mg by mouth daily. )  . [DISCONTINUED] fluticasone (FLONASE) 50 MCG/ACT nasal spray Place 2 sprays into both nostrils daily. (Patient taking differently: Place 2 sprays into both nostrils daily as needed for allergies. )  . [DISCONTINUED] ibuprofen (ADVIL,MOTRIN) 800 MG tablet Take 1 tablet (800 mg total) by mouth 3 (three) times daily. (Patient taking differently: Take 800 mg by mouth 3 (three) times daily as needed for moderate pain. )  . [DISCONTINUED] sacubitril-valsartan (ENTRESTO) 49-51 MG Take 1 tablet by mouth 2 (two) times daily.  . [DISCONTINUED] sacubitril-valsartan (ENTRESTO) 49-51 MG Take 1 tablet by mouth 2 (two) times daily for 30 days.  . [DISCONTINUED] simvastatin (ZOCOR) 40 MG tablet TAKE 1 TABLET EVERY EVENING (Patient taking differently: Take 40 mg by mouth every evening. )  . [DISCONTINUED] tamsulosin (FLOMAX) 0.4 MG CAPS capsule TAKE 1 CAPSULE 30 MINUTES AFTER THE [facility] EACH DAY (Patient taking differently: Take 0.4 mg by mouth daily after supper. )     Allergies: Allergies  Allergen Reactions  . Ivp Dye [Iodinated Diagnostic Agents] Swelling and Other (See Comments)    Arrest  . Morphine And Related Nausea And Vomiting     Social History: The patient  reports that he quit smoking about 25 years ago. His smoking use included cigarettes. He has a 64.00 pack-year smoking history. He has never used smokeless tobacco. He reports that he does not drink alcohol or use drugs.   Family History: [name] family history includes Cancer in his brother and maternal grandmother; Diabetes in his mother; Hypertension in his mother; Melanoma in his brother; Prostatitis in his brother.   Review of Systems: Please see the history of present illness.   Otherwise, the review of systems is positive for none.   All other systems are reviewed and negative.   Physical Exam: VS:  BP 126/68 (BP Location: Left Arm, Patient Position: Sitting, Cuff Size: Normal)   Pulse 73   Ht  5\' 9"  (1.753 m)   Wt 254 lb 12.8 oz (115.6 kg)   SpO2 95% Comment: at rest  BMI 37.63 kg/m  .  BMI Body mass index is 37.63 kg/m.  Wt Readings from Last 3 Encounters:  02/04/18 254 lb 12.8 oz (115.6 kg)  01/06/18 247 lb 2.2 oz (112.1 kg)  11/06/17 244 lb 6.4 oz (110.9 kg)    General: Pleasant. Well developed, well nourished and in no acute distress.   HEENT: Normal.  Neck: Supple, no JVD, carotid bruits, or masses noted.  Cardiac: Regular rate and rhythm. No murmurs, rubs, or gallops. No edema.  Respiratory:  Lungs are clear to auscultation bilaterally with normal work of breathing.  GI: Soft and nontender.  MS: No deformity or atrophy. Gait and ROM intact.  Skin: Warm and dry. Color is normal.  Neuro:  Strength and sensation are intact and no gross focal deficits noted.  Psych: Alert, appropriate and with normal affect.   LABORATORY DATA:  EKG:  EKG is not ordered today.  Lab Results  Component Value Date   WBC 9.4 01/06/2018   HGB 14.1 01/06/2018   HCT 44.6 01/06/2018   PLT 159 01/06/2018   GLUCOSE 129 (H) 01/05/2018   CHOL 140 05/20/2016   TRIG 116 05/20/2016   HDL 40 05/20/2016   LDLCALC 77 05/20/2016   ALT 16 (L) 01/20/2017    AST 18 01/20/2017   NA 141 01/05/2018   K 4.3 01/05/2018   CL 110 01/05/2018   CREATININE 1.04 01/05/2018   BUN 23 01/05/2018   CO2 20 (L) 01/05/2018   INR 0.96 01/20/2017     BNP (last 3 results) No results for input(s): BNP in the last 8760 hours.  ProBNP (last 3 results) No results for input(s): PROBNP in the last 8760 hours.   Other Studies Reviewed Today:  Echo Study Conclusions 12/2017 - Left ventricle: The cavity size was severely dilated. There was   moderate focal basal hypertrophy of the posterior wall. Systolic   function was mildly to moderately reduced. The estimated ejection   fraction was in the range of 40% to 45%. Akinesis of the   mid-apicalanterior, anterolateral, and apical myocardium. Doppler   parameters are consistent with abnormal left ventricular   relaxation (grade 1 diastolic dysfunction). Doppler parameters   are consistent with indeterminate ventricular filling pressure. - Aortic valve: Transvalvular velocity was within the normal range.   There was no stenosis. There was no regurgitation. - Aorta: Ascending aortic diameter: 40 mm (S). - Ascending aorta: The ascending aorta was mildly dilated. - Mitral valve: Transvalvular velocity was within the normal range.   There was no evidence for stenosis. There was trivial   regurgitation. - Left atrium: The atrium was severely dilated. - Right ventricle: The cavity size was normal. Wall thickness was   normal. Systolic function was normal. - Atrial septum: No defect or patent foramen ovale was identified   by color flow Doppler. - Tricuspid valve: There was trivial regurgitation. - Pulmonary arteries: Systolic pressure was within the normal   range. PA peak pressure: 19 mm Hg (S). - Pericardium, extracardiac: A trivial pericardial effusion was   identified.  Study Conclusions May 2017  - Left ventricle: The cavity size was severely dilated. Wall thickness was increased in a pattern of  mild LVH. Systolic function was moderately to severely reduced. The estimated ejection fraction was in the range of 30% to 35%. Predominant anterior, apical and inferoapical akinesis suggestive of LAD infarct.  Doppler parameters are consistent with abnormal left ventricular relaxation (grade 1 diastolic dysfunction). The E/e&' ratio is >15, suggesting elevated LV filling pressure. - Left atrium: The atrium was mildly dilated. - Right ventricle: The cavity size was mildly dilated. Systolic function was normal. - Inferior vena cava: The vessel was normal in size. The respirophasic diameter changes were in the normal range (= 50%), consistent with normal central venous pressure.  Impressions:  - Compared to a prior study in 2012, the LVEF has improved to 30-35% with predominantly LAD territory wall motion abnormalities.   Cardiac Cath 05/30/10 ANGIOGRAPHIC DATA: The left main coronary artery is free of significant disease.  The left anterior descending artery courses to the apex. Beyond the origin of the large diagonal branch there is narrowing that measures about 30% in luminal diameter. This does not appear to be high-grade. The LAD courses to the apex and wraps the apical tip. There was a large diagonal branch with minimal ostial irregularity but no high-grade lesions. There are smaller distal diagonal branches.  The circumflex coronary provides a small first marginal branch that is insignificant. The circumflex consists predominately of a very large marginal branch that courses out over the anterolateral wall and bifurcates distally and is free of critical disease. The AV circumflex is small and provides an atrial circumflex branch.  The right coronary artery is a large dominant vessel. The right coronary artery provides a posterior descending branch which bifurcates and five posterolateral branches. The distal right coronary demonstrates  no high-grade lesions.  LEFT VENTRICULOGRAPHY: Ventriculography in the RAO projection reveals anterolateral and apical akinesis. The inferobasal and superior basal segments appear to move. Ejection fraction was calculated at 25% but visually was more in the range of 35%. No significant mitral regurgitation was noted.  CONCLUSIONS: 1. Moderately severe reduction in global left ventricular function. 2. No evidence of re-stenosis in the left anterior descending artery. 3. Continued patency of the circumflex and right coronary arteries.  DISPOSITION: The patient is already on an ACE inhibitor, beta blockers and cholesterol lowering agent. He is also on aspirin. Protonix has been added to his regimen because of a history of GERD. A GI consult will be obtained. I have called Dr. Mallie Mussel and also discussed the case with Roque Cash.  Assessment & Plan:  1. Hypotension/syncope - felt to be due to marked hypotension - his Delene Loll has been cut back - on mid dose now - refilled today. He has no symptoms now. No changes made today.   2. Ischemic CM - EF has improved by recent echo. His medicines have resulted in hypotension/syncope - would keep on his current regimen. He has declined ICD implant - now does not qualify with improved EF.   3. CAD with old anterior MI many years ago in 1995, no PCI, total LAD occlusion noted on 2001 heart catheterization and 30% LAD beyond the origin of a large diagonal branch, circumflex was normal, RCA was normal. EF now in the 40 to 45% range. He is not having any symptoms with current regimen.   4. HTN - BP is fine now.   5. HLD - on statin therapy  6. Obesity  Current medicines are reviewed with the patient today.  The patient does not have concerns regarding medicines other than what has been noted above.  The following changes have been made:  See above.  Labs/ tests ordered today include:   No orders of the defined types were placed in  this encounter.  Disposition:   FU with Dr. Marlou Porch in June as planned.   Patient is agreeable to this plan and will call if any problems develop in the interim.   SignedTruitt Merle, NP  02/04/2018 9:48 AM  [place] [address] [place], [place]  [address] Phone: [phone] Fax: [phone]

## 2018-02-04 NOTE — Patient Instructions (Addendum)
We will be checking the following labs today - NONE   Medication Instructions:    Continue with your current medicines.    If you need a refill on your cardiac medications before your next appointment, please call your pharmacy.     Testing/Procedures To Be Arranged:  N/A  Follow-Up:   See Dr. Marlou Porch in June    At [facility], you and your health needs are our priority.  As part of our continuing mission to provide you with exceptional heart care, we have created designated Provider Care Teams.  These Care Teams include your primary Cardiologist (physician) and Advanced Practice Providers (APPs -  Physician Assistants and Nurse Practitioners) who all work together to provide you with the care you need, when you need it.  Special Instructions:  . None  Call the [place] office at [phone] if you have any questions, problems or concerns.

## 2018-02-06 ENCOUNTER — Ambulatory Visit (INDEPENDENT_AMBULATORY_CARE_PROVIDER_SITE_OTHER): Payer: Medicare Other | Admitting: Physician Assistant

## 2018-02-06 ENCOUNTER — Encounter: Payer: Self-pay | Admitting: Physician Assistant

## 2018-02-06 VITALS — BP 110/72 | HR 62 | Temp 97.2°F | Ht 69.0 in | Wt 251.8 lb

## 2018-02-06 DIAGNOSIS — N401 Enlarged prostate with lower urinary tract symptoms: Secondary | ICD-10-CM

## 2018-02-06 DIAGNOSIS — Z96651 Presence of right artificial knee joint: Secondary | ICD-10-CM

## 2018-02-06 DIAGNOSIS — B9689 Other specified bacterial agents as the cause of diseases classified elsewhere: Secondary | ICD-10-CM | POA: Diagnosis not present

## 2018-02-06 DIAGNOSIS — Z0000 Encounter for general adult medical examination without abnormal findings: Secondary | ICD-10-CM

## 2018-02-06 DIAGNOSIS — J208 Acute bronchitis due to other specified organisms: Secondary | ICD-10-CM | POA: Diagnosis not present

## 2018-02-06 DIAGNOSIS — I2510 Atherosclerotic heart disease of native coronary artery without angina pectoris: Secondary | ICD-10-CM

## 2018-02-06 DIAGNOSIS — R350 Frequency of micturition: Secondary | ICD-10-CM

## 2018-02-06 MED ORDER — DICLOFENAC SODIUM 1 % TD GEL: 1.0000 "application " | Freq: Three times a day (TID) | TRANSDERMAL | 3 refills | Status: AC | PRN

## 2018-02-06 MED ORDER — SIMVASTATIN 40 MG PO TABS: 40.0000 mg | ORAL_TABLET | Freq: Every day | ORAL | 3 refills | Status: DC

## 2018-02-06 MED ORDER — HYDROCODONE-HOMATROPINE 5-1.5 MG/5ML PO SYRP: 5.0000 mL | ORAL_SOLUTION | Freq: Four times a day (QID) | ORAL | 0 refills | Status: DC | PRN

## 2018-02-06 MED ORDER — OMEPRAZOLE 20 MG PO CPDR: 20.0000 mg | DELAYED_RELEASE_CAPSULE | Freq: Every day | ORAL | 3 refills | Status: DC

## 2018-02-06 MED ORDER — FLUTICASONE PROPIONATE 50 MCG/ACT NA SUSP: 1.0000 | NASAL | 3 refills | Status: DC | PRN

## 2018-02-06 MED ORDER — TAMSULOSIN HCL 0.4 MG PO CAPS: 0.4000 mg | ORAL_CAPSULE | Freq: Every day | ORAL | 3 refills | Status: DC

## 2018-02-06 NOTE — Progress Notes (Signed)
BP 110/72   Pulse 62   Temp (!) 97.2 F (36.2 C) (Oral)   Ht 5' 9"  (1.753 m)   Wt 251 lb 12.8 oz (114.2 kg)   BMI 37.18 kg/m    Subjective:    Patient ID: Tony Patrick, male    DOB: 09-11-1950, 68 y.o.   MRN: [id number]  HPI: Tony Patrick is a 68 y.o. male presenting on 02/06/2018 for Hypertension (71month) and Hyperlipidemia  This patient comes in for periodic recheck on medications and conditions including  We discussed coronary artery disease, BPH, recurrent cough with history of bronchitis, chronic knee pain from osteoarthritis.  He was seen by his cardiologist most recently and there were adjustments made to his Entresto.  He has had a great improvement in his function.  He is in the 40% range now.  He will follow-up with him in a few months.  He was seen over the holidays in the emergency room because of the higher dose ingestion causing him to have a hypotensive episode..   All medications are reviewed today. There are no reports of any problems with the medications. All of the medical conditions are reviewed and updated.  Lab work is reviewed and will be ordered as medically necessary. There are no new problems reported with today's visit.   Past Medical History:  Diagnosis Date  . Arthritis   . Atherosclerotic plaque   . Basal cell carcinoma   . CAD (coronary artery disease)    Anterior MI, [facility], 1995, no PCI, total LAD  / catheterization 2001, 30% LAD beyond the origin of a large diagonal, circumflex normal, RCA normal, anterolateral and apical  akinesis, ejection fraction 25-35% range  . Dyslipidemia   . Ejection fraction < 50%    EF 25-35%, catheterization 2001 / EF 20-25%, global hypokinesis, echo in June, 2012  . GERD (gastroesophageal reflux disease)   . History of bronchitis   . Hypoxia    Pneumonia, hospitalization, June, 2012  . kidney stones   . [place]  . Pneumonia 2012  . S/P colectomy    Benign tumor   Relevant past  medical, surgical, family and social history reviewed and updated as indicated. Interim medical history since our last visit reviewed. Allergies and medications reviewed and updated. DATA REVIEWED: CHART IN EPIC  Family History reviewed for pertinent findings.  Review of Systems  Constitutional: Negative.  Negative for appetite change and fatigue.  HENT: Negative.   Eyes: Negative.  Negative for pain and visual disturbance.  Respiratory: Negative.  Negative for cough, chest tightness, shortness of breath and wheezing.   Cardiovascular: Negative.  Negative for chest pain, palpitations and leg swelling.  Gastrointestinal: Negative.  Negative for abdominal pain, diarrhea, nausea and vomiting.  Endocrine: Negative.   Genitourinary: Negative.   Musculoskeletal: Positive for arthralgias and back pain.  Skin: Negative.  Negative for color change and rash.  Neurological: Negative.  Negative for weakness, numbness and headaches.  Psychiatric/Behavioral: Negative.     Allergies as of 02/06/2018      Reactions   Ivp Dye [iodinated Diagnostic Agents] Swelling, Other (See Comments)   Arrest   Morphine And Related Nausea And Vomiting      Medication List       Accurate as of February 06, 2018 10:42 AM. Always use your most recent med list.        aspirin 81 MG chewable tablet Chew 81 mg by mouth daily.   carvedilol 25 MG  tablet Commonly known as:  COREG Take 1 tablet (25 mg total) by mouth 2 (two) times daily.   cetirizine 10 MG tablet Commonly known as:  ZYRTEC Take 10 mg by mouth daily.   diclofenac sodium 1 % Gel Commonly known as:  VOLTAREN Apply 1 application topically 3 (three) times daily as needed (for hands).   finasteride 5 MG tablet Commonly known as:  PROSCAR Take 5 mg by mouth daily.   fluticasone 50 MCG/ACT nasal spray Commonly known as:  FLONASE Place 1 spray into both nostrils as needed for allergies or rhinitis.   HYDROcodone-homatropine 5-1.5 MG/5ML  syrup Commonly known as:  HYCODAN Take 5-10 mLs by mouth every 6 (six) hours as needed for cough.   ibuprofen 200 MG tablet Commonly known as:  ADVIL,MOTRIN Take 800 mg by mouth every 6 (six) hours as needed for moderate pain.   omeprazole 20 MG capsule Commonly known as:  PRILOSEC Take 1 capsule (20 mg total) by mouth daily. (Needs to be seen before next refill)   sacubitril-valsartan 49-51 MG Commonly known as:  ENTRESTO Take 1 tablet by mouth 2 (two) times daily for 30 days.   simvastatin 40 MG tablet Commonly known as:  ZOCOR Take 1 tablet (40 mg total) by mouth daily at 6 PM.   spironolactone 25 MG tablet Commonly known as:  ALDACTONE Take 1 tablet (25 mg total) by mouth daily.   tamsulosin 0.4 MG Caps capsule Commonly known as:  FLOMAX Take 1 capsule (0.4 mg total) by mouth daily after supper.          Objective:    BP 110/72   Pulse 62   Temp (!) 97.2 F (36.2 C) (Oral)   Ht 5' 9"  (1.753 m)   Wt 251 lb 12.8 oz (114.2 kg)   BMI 37.18 kg/m   Allergies  Allergen Reactions  . Ivp Dye [Iodinated Diagnostic Agents] Swelling and Other (See Comments)    Arrest  . Morphine And Related Nausea And Vomiting    Wt Readings from Last 3 Encounters:  02/06/18 251 lb 12.8 oz (114.2 kg)  02/04/18 254 lb 12.8 oz (115.6 kg)  01/06/18 247 lb 2.2 oz (112.1 kg)    Physical Exam Constitutional:      Appearance: He is well-developed.  HENT:     Head: Normocephalic and atraumatic.  Eyes:     Conjunctiva/sclera: Conjunctivae normal.     Pupils: Pupils are equal, round, and reactive to light.  Neck:     Musculoskeletal: Normal range of motion and neck supple.  Cardiovascular:     Rate and Rhythm: Normal rate and regular rhythm.     Heart sounds: Normal heart sounds.  Pulmonary:     Effort: Pulmonary effort is normal.     Breath sounds: Normal breath sounds.  Abdominal:     General: Bowel sounds are normal.     Palpations: Abdomen is soft.  Musculoskeletal: Normal  range of motion.  Skin:    General: Skin is warm and dry.     Results for orders placed or performed during the hospital encounter of 84/16/60  Basic metabolic panel  Result Value Ref Range   Sodium 141 135 - 145 mmol/L   Potassium 4.3 3.5 - 5.1 mmol/L   Chloride 110 98 - 111 mmol/L   CO2 20 (L) 22 - 32 mmol/L   Glucose, Bld 129 (H) 70 - 99 mg/dL   BUN 23 8 - 23 mg/dL   Creatinine, Ser 1.04 0.61 -  1.24 mg/dL   Calcium 9.1 8.9 - 10.3 mg/dL   GFR calc non Af Amer >60 >60 mL/min   GFR calc Af Amer >60 >60 mL/min   Anion gap 11 5 - 15  CBC  Result Value Ref Range   WBC 12.2 (H) 4.0 - 10.5 K/uL   RBC 5.19 4.22 - 5.81 MIL/uL   Hemoglobin 15.8 13.0 - 17.0 g/dL   HCT 48.8 39.0 - 52.0 %   MCV 94.0 80.0 - 100.0 fL   MCH 30.4 26.0 - 34.0 pg   MCHC 32.4 30.0 - 36.0 g/dL   RDW 13.6 11.5 - 15.5 %   Platelets 192 150 - 400 K/uL   nRBC 0.0 0.0 - 0.2 %  HIV antibody (Routine Testing)  Result Value Ref Range   HIV Screen 4th Generation wRfx Non Reactive Non Reactive  Troponin I - Now Then Q6H  Result Value Ref Range   Troponin I 0.04 (HH) <0.03 ng/mL  Troponin I - Now Then Q6H  Result Value Ref Range   Troponin I 0.04 (HH) <0.03 ng/mL  Troponin I - Now Then Q6H  Result Value Ref Range   Troponin I 0.03 (HH) <0.03 ng/mL  CBC  Result Value Ref Range   WBC 9.4 4.0 - 10.5 K/uL   RBC 4.84 4.22 - 5.81 MIL/uL   Hemoglobin 14.1 13.0 - 17.0 g/dL   HCT 44.6 39.0 - 52.0 %   MCV 92.1 80.0 - 100.0 fL   MCH 29.1 26.0 - 34.0 pg   MCHC 31.6 30.0 - 36.0 g/dL   RDW 13.8 11.5 - 15.5 %   Platelets 159 150 - 400 K/uL   nRBC 0.0 0.0 - 0.2 %  I-stat troponin, ED  Result Value Ref Range   Troponin i, poc 0.07 0.00 - 0.08 ng/mL   Comment 3          ECHOCARDIOGRAM COMPLETE  Result Value Ref Range   Weight 3,954.17 oz   Height 69 in   BP 106/76 mmHg      Assessment & Plan:   1. Hx of total knee arthroplasty, right - diclofenac sodium (VOLTAREN) 1 % GEL; Apply 1 application topically 3  (three) times daily as needed (for hands).  Dispense: 1200 g; Refill: 3  2. Acute bacterial bronchitis - HYDROcodone-homatropine (HYCODAN) 5-1.5 MG/5ML syrup; Take 5-10 mLs by mouth every 6 (six) hours as needed for cough.  Dispense: 240 mL; Refill: 0  3. Coronary artery disease involving native coronary artery of native heart without angina pectoris - CBC with Differential/Platelet - CMP14+EGFR - Lipid panel - PSA  4. Benign prostatic hyperplasia with urinary frequency - tamsulosin (FLOMAX) 0.4 MG CAPS capsule; Take 1 capsule (0.4 mg total) by mouth daily after supper.  Dispense: 90 capsule; Refill: 3 - PSA  5. Well adult exam - CBC with Differential/Platelet - CMP14+EGFR - Lipid panel - PSA   Continue all other maintenance medications as listed above.  Follow up plan: Return in about 6 months (around 08/07/2018) for recheck.  Educational handout given for [place] PA-C [facility] [address]  [place], [place] [address] [phone]   02/06/2018, 10:42 AM

## 2018-02-07 LAB — CMP14+EGFR
ALK PHOS: 56 IU/L (ref 39–117)
ALT: 15 IU/L (ref 0–44)
AST: 15 IU/L (ref 0–40)
Albumin/Globulin Ratio: 1.8 (ref 1.2–2.2)
Albumin: 4 g/dL (ref 3.8–4.8)
BUN/Creatinine Ratio: 21 (ref 10–24)
BUN: 20 mg/dL (ref 8–27)
Bilirubin Total: 0.6 mg/dL (ref 0.0–1.2)
CO2: 19 mmol/L — ABNORMAL LOW (ref 20–29)
Calcium: 9 mg/dL (ref 8.6–10.2)
Chloride: 104 mmol/L (ref 96–106)
Creatinine, Ser: 0.97 mg/dL (ref 0.76–1.27)
GFR calc Af Amer: 92 mL/min/{1.73_m2} (ref >59)
GFR calc non Af Amer: 79 mL/min/{1.73_m2} (ref >59)
Globulin, Total: 2.2 g/dL (ref 1.5–4.5)
Glucose: 103 mg/dL — ABNORMAL HIGH (ref 65–99)
Potassium: 4.6 mmol/L (ref 3.5–5.2)
Sodium: 141 mmol/L (ref 134–144)
Total Protein: 6.2 g/dL (ref 6.0–8.5)

## 2018-02-07 LAB — LIPID PANEL
Chol/HDL Ratio: 3.7 ratio (ref 0.0–5.0)
Cholesterol, Total: 146 mg/dL (ref 100–199)
HDL: 39 mg/dL — AB (ref >39)
LDL Calculated: 91 mg/dL (ref 0–99)
Triglycerides: 78 mg/dL (ref 0–149)
VLDL Cholesterol Cal: 16 mg/dL (ref 5–40)

## 2018-02-07 LAB — CBC WITH DIFFERENTIAL/PLATELET
Basophils Absolute: 0 10*3/uL (ref 0.0–0.2)
Basos: 1 %
EOS (ABSOLUTE): 0.2 10*3/uL (ref 0.0–0.4)
Eos: 3 %
Hematocrit: 45.2 % (ref 37.5–51.0)
Hemoglobin: 14.9 g/dL (ref 13.0–17.7)
Immature Grans (Abs): 0 10*3/uL (ref 0.0–0.1)
Immature Granulocytes: 0 %
Lymphocytes Absolute: 1.4 10*3/uL (ref 0.7–3.1)
Lymphs: 21 %
MCH: 30.5 pg (ref 26.6–33.0)
MCHC: 33 g/dL (ref 31.5–35.7)
MCV: 92 fL (ref 79–97)
Monocytes Absolute: 0.5 10*3/uL (ref 0.1–0.9)
Monocytes: 8 %
Neutrophils Absolute: 4.4 10*3/uL (ref 1.4–7.0)
Neutrophils: 67 %
Platelets: 170 10*3/uL (ref 150–450)
RBC: 4.89 x10E6/uL (ref 4.14–5.80)
RDW: 13.2 % (ref 11.6–15.4)
WBC: 6.5 10*3/uL (ref 3.4–10.8)

## 2018-02-07 LAB — PSA: Prostate Specific Ag, Serum: 1.3 ng/mL (ref 0.0–4.0)

## 2018-03-12 ENCOUNTER — Other Ambulatory Visit: Payer: Self-pay | Admitting: Physician Assistant

## 2018-03-12 ENCOUNTER — Telehealth: Payer: Self-pay | Admitting: Physician Assistant

## 2018-03-12 MED ORDER — AZITHROMYCIN 250 MG PO TABS: ORAL_TABLET | ORAL | 0 refills | Status: DC

## 2018-03-12 NOTE — Telephone Encounter (Signed)
Patient aware.

## 2018-03-12 NOTE — Telephone Encounter (Signed)
sent 

## 2018-03-13 ENCOUNTER — Encounter: Payer: Self-pay | Admitting: Family Medicine

## 2018-03-13 ENCOUNTER — Ambulatory Visit (INDEPENDENT_AMBULATORY_CARE_PROVIDER_SITE_OTHER): Payer: Medicare Other | Admitting: Family Medicine

## 2018-03-13 VITALS — BP 110/70 | HR 71 | Temp 97.2°F | Ht 69.0 in | Wt 255.0 lb

## 2018-03-13 DIAGNOSIS — R062 Wheezing: Secondary | ICD-10-CM

## 2018-03-13 DIAGNOSIS — J069 Acute upper respiratory infection, unspecified: Secondary | ICD-10-CM

## 2018-03-13 MED ORDER — METHYLPREDNISOLONE ACETATE 40 MG/ML IJ SUSP
40.0000 mg | Freq: Once | INTRAMUSCULAR | Status: AC
  Administered 2018-03-13: 40 mg via INTRAMUSCULAR

## 2018-03-13 MED ORDER — ALBUTEROL SULFATE (2.5 MG/3ML) 0.083% IN NEBU: 2.5000 mg | INHALATION_SOLUTION | Freq: Four times a day (QID) | RESPIRATORY_TRACT | 1 refills | Status: AC | PRN

## 2018-03-13 NOTE — Progress Notes (Signed)
    Subjective:     Tony Patrick is a 68 y.o. male who presents for evaluation of symptoms of a URI. Symptoms include congestion, post nasal drip, productive cough with  yellow colored sputum and wheezing. Onset of symptoms was 4 days ago, and has been gradually worsening since that time. Treatment to date: antibiotics and cough suppressants.  The following portions of the patient's history were reviewed and updated as appropriate: allergies, current medications, past family history, past medical history, past social history, past surgical history and problem list.  Review of Systems Constitutional: positive for chills and malaise Eyes: negative Ears, nose, mouth, throat, and face: positive for nasal congestion Respiratory: positive for cough and sputum Cardiovascular: negative Musculoskeletal:negative Neurological: negative   Objective:    BP 110/70   Pulse 71   Temp (!) 97.2 F (36.2 C) (Oral)   Ht 5\' 9"  (1.753 m)   Wt 255 lb (115.7 kg)   SpO2 96%   BMI 37.66 kg/m  General appearance: alert, cooperative, appears stated age and no distress Head: Normocephalic, without obvious abnormality, atraumatic Eyes: negative Ears: normal TM's and external ear canals both ears Nose: clear and scant discharge, mild congestion, turbinates normal, no sinus tenderness Throat: lips, mucosa, and tongue normal; teeth and gums normal Neck: no adenopathy, no carotid bruit, no JVD, supple, symmetrical, trachea midline and thyroid not enlarged, symmetric, no tenderness/mass/nodules Lungs: wheezes posterior - bilateral, mild, bases Heart: regular rate and rhythm, S1, S2 normal, no murmur, click, rub or gallop Skin: Skin color, texture, turgor normal. No rashes or lesions Neurologic: Grossly normal   Assessment:   [place] was seen today for uri.  Diagnoses and all orders for this visit:  URI with cough and congestion Symptomatic care discussed. Continue prescribed medications. Report any new  or worsening symptoms.  -     albuterol (PROVENTIL) (2.5 MG/3ML) 0.083% nebulizer solution; Take 3 mLs (2.5 mg total) by nebulization every 6 (six) hours as needed for wheezing or shortness of breath. -     methylPREDNISolone acetate (DEPO-MEDROL) injection 40 mg  Wheezing -     albuterol (PROVENTIL) (2.5 MG/3ML) 0.083% nebulizer solution; Take 3 mLs (2.5 mg total) by nebulization every 6 (six) hours as needed for wheezing or shortness of breath. -     methylPREDNISolone acetate (DEPO-MEDROL) injection 40 mg  Continue Z-Pack and Hycodan prescribed by Particia Nearing, PA-C.    Plan:    Discussed diagnosis and treatment of URI. Suggested symptomatic OTC remedies. Nasal saline spray for congestion. Follow up as needed.    The above assessment and management plan was discussed with the patient. The patient verbalized understanding of and has agreed to the management plan. Patient is aware to call the clinic if symptoms fail to improve or worsen. Patient is aware when to return to the clinic for a follow-up visit. Patient educated on when it is appropriate to go to the emergency department.   Monia Pouch, FNP-C [facility] [address] [place], [place] [address] [phone]

## 2018-03-13 NOTE — Patient Instructions (Signed)

## 2018-06-19 ENCOUNTER — Telehealth: Payer: Self-pay

## 2018-06-19 NOTE — Telephone Encounter (Signed)
YOUR CARDIOLOGY TEAM HAS ARRANGED FOR AN E-VISIT FOR YOUR APPOINTMENT - PLEASE REVIEW IMPORTANT INFORMATION BELOW SEVERAL DAYS PRIOR TO YOUR APPOINTMENT  Due to the recent COVID-19 pandemic, we are transitioning in-person office visits to tele-medicine visits in an effort to decrease unnecessary exposure to our patients, their families, and staff. These visits are billed to your insurance just like a normal visit is. We also encourage you to sign up for MyChart if you have not already done so. You will need a smartphone if possible. For patients that do not have this, we can still complete the visit using a regular telephone but do prefer a smartphone to enable video when possible. You may have a family member that lives with you that can help. If possible, we also ask that you have a blood pressure cuff and scale at home to measure your blood pressure, heart rate and weight prior to your scheduled appointment. Patients with clinical needs that need an in-person evaluation and testing will still be able to come to the office if absolutely necessary. If you have any questions, feel free to call our office.     YOUR PROVIDER WILL BE USING THE FOLLOWING PLATFORM TO COMPLETE YOUR VISIT: [email or web address]  . IF USING MYCHART - How to Download the MyChart App to Your SmartPhone   - If Apple, go to App Store and type in MyChart in the search bar and download the app. If Android, ask patient to go to Google Play Store and type in MyChart in the search bar and download the app. The app is free but as with any other app downloads, your phone may require you to verify saved payment information or Apple/Android password.  - You will need to then log into the app with your MyChart username and password, and select [facility] as your healthcare provider to link the account.  - When it is time for your visit, go to the MyChart app, find appointments, and click Begin Video Visit. Be sure to Select Allow for your device to  access the Microphone and Camera for your visit. You will then be connected, and your provider will be with you shortly.  **If you have any issues connecting or need assistance, please contact MyChart service desk [phone] ([phone])**  **If using a computer, in order to ensure the best quality for your visit, you will need to use either of the following Internet Browsers: Google Chrome or Microsoft Edge**  . IF USING DOXIMITY or [email or web address].ME - The staff will give you instructions on receiving your link to join the meeting the day of your visit.      2-3 DAYS BEFORE YOUR APPOINTMENT  You will receive a telephone call from one of our [facility] team members - your caller ID may say "Unknown caller." If this is a video visit, we will walk you through how to get the video launched on your phone. We will remind you check your blood pressure, heart rate and weight prior to your scheduled appointment. If you have an Apple Watch or Kardia, please upload any pertinent ECG strips the day before or morning of your appointment to MyChart. Our staff will also make sure you have reviewed the consent and agree to move forward with your scheduled tele-health visit.     THE DAY OF YOUR APPOINTMENT  Approximately 15 minutes prior to your scheduled appointment, you will receive a telephone call from one of HeartCare team - your caller ID may say "Unknown caller."    Our staff will confirm medications, vital signs for the day and any symptoms you may be experiencing. Please have this information available prior to the time of visit start. It may also be helpful for you to have a pad of paper and pen handy for any instructions given during your visit. They will also walk you through joining the smartphone meeting if this is a video visit.    CONSENT FOR TELE-HEALTH VISIT - PLEASE REVIEW  I hereby voluntarily request, consent and authorize [facility] and its employed or contracted physicians, physician  assistants, nurse practitioners or other licensed health care professionals (the Practitioner), to provide me with telemedicine health care services (the "Services") as deemed necessary by the treating Practitioner. I acknowledge and consent to receive the Services by the Practitioner via telemedicine. I understand that the telemedicine visit will involve communicating with the Practitioner through live audiovisual communication technology and the disclosure of certain medical information by electronic transmission. I acknowledge that I have been given the opportunity to request an in-person assessment or other available alternative prior to the telemedicine visit and am voluntarily participating in the telemedicine visit.  I understand that I have the right to withhold or withdraw my consent to the use of telemedicine in the course of my care at any time, without affecting my right to future care or treatment, and that the Practitioner or I may terminate the telemedicine visit at any time. I understand that I have the right to inspect all information obtained and/or recorded in the course of the telemedicine visit and may receive copies of available information for a reasonable fee.  I understand that some of the potential risks of receiving the Services via telemedicine include:  . Delay or interruption in medical evaluation due to technological equipment failure or disruption; . Information transmitted may not be sufficient (e.g. poor resolution of images) to allow for appropriate medical decision making by the Practitioner; and/or  . In rare instances, security protocols could fail, causing a breach of personal health information.  Furthermore, I acknowledge that it is my responsibility to provide information about my medical history, conditions and care that is complete and accurate to the best of my ability. I acknowledge that Practitioner's advice, recommendations, and/or decision may be based on  factors not within their control, such as incomplete or inaccurate data provided by me or distortions of diagnostic images or specimens that may result from electronic transmissions. I understand that the practice of medicine is not an exact science and that Practitioner makes no warranties or guarantees regarding treatment outcomes. I acknowledge that I will receive a copy of this consent concurrently upon execution via email to the email address I last provided but may also request a printed copy by calling the office of [facility].    I understand that my insurance will be billed for this visit.   I have read or had this consent read to me. . I understand the contents of this consent, which adequately explains the benefits and risks of the Services being provided via telemedicine.  . I have been provided ample opportunity to ask questions regarding this consent and the Services and have had my questions answered to my satisfaction. . I give my informed consent for the services to be provided through the use of telemedicine in my medical care  By participating in this telemedicine visit I agree to the above.  

## 2018-06-23 ENCOUNTER — Telehealth (INDEPENDENT_AMBULATORY_CARE_PROVIDER_SITE_OTHER): Payer: Medicare Other | Admitting: Cardiology

## 2018-06-23 ENCOUNTER — Encounter: Payer: Self-pay | Admitting: Cardiology

## 2018-06-23 ENCOUNTER — Other Ambulatory Visit: Payer: Self-pay

## 2018-06-23 VITALS — BP 140/80 | Ht 69.0 in | Wt 246.0 lb

## 2018-06-23 DIAGNOSIS — I10 Essential (primary) hypertension: Secondary | ICD-10-CM

## 2018-06-23 DIAGNOSIS — R55 Syncope and collapse: Secondary | ICD-10-CM

## 2018-06-23 DIAGNOSIS — I5022 Chronic systolic (congestive) heart failure: Secondary | ICD-10-CM

## 2018-06-23 DIAGNOSIS — I420 Dilated cardiomyopathy: Secondary | ICD-10-CM

## 2018-06-23 DIAGNOSIS — I252 Old myocardial infarction: Secondary | ICD-10-CM

## 2018-06-23 DIAGNOSIS — I2510 Atherosclerotic heart disease of native coronary artery without angina pectoris: Secondary | ICD-10-CM

## 2018-06-23 DIAGNOSIS — E785 Hyperlipidemia, unspecified: Secondary | ICD-10-CM

## 2018-06-23 NOTE — Progress Notes (Signed)
Virtual Visit via Phone Note   This visit type was conducted due to national recommendations for restrictions regarding the COVID-19 Pandemic (e.g. social distancing) in an effort to limit this patient's exposure and mitigate transmission in our community.  Due to his co-morbid illnesses, this patient is at least at moderate risk for complications without adequate follow up.  This format is felt to be most appropriate for this patient at this time.  All issues noted in this document were discussed and addressed.  A limited physical exam was performed with this format.  Please refer to the patient's chart for his consent to telehealth for [facility].   Date:  06/23/2018   ID:  Tony Patrick, DOB September 10, 1950, MRN [id number]  Patient Location: Home Provider Location: Home  PCP:  Terald Sleeper, PA-C  Cardiologist:  Candee Furbish, MD  Electrophysiologist:  None   Evaluation Performed:  Follow-Up Visit  Chief Complaint:  CHF follow up  History of Present Illness:    Tony Patrick is a 68 y.o. male with a hx of Coronary artery disease status post anterior myocardial infarction in 1995 with Subsequent cardiac catheterization in 2001, see below with ejection fraction of 25% at that time, currently 30-35%, did not wish to proceed with ICD here for follow-up.  Multiple discussions about ICD in the past, not interested. He states that his first heart attack was in 1995 and he has lived a long life since then.  Back in 2012 nuclear stress test was recommended but he did not wish to have this done.  Entresto.   01/06/18 - came to ER with cancelled STEMI, BP of 80, syncope x 3, thought to be Entresto related, cut back. EF 40-45%.  After reducing Entresto, felt better.   He is still working with the Therapist, nutritional periodically daily. His orthopedic issues predominate.  The patient does not have symptoms concerning for COVID-19 infection (fever, chills, cough, or new shortness of  breath).    Past Medical History:  Diagnosis Date  . Arthritis   . Atherosclerotic plaque   . Basal cell carcinoma   . CAD (coronary artery disease)    Anterior MI, [facility], 1995, no PCI, total LAD  / catheterization 2001, 30% LAD beyond the origin of a large diagonal, circumflex normal, RCA normal, anterolateral and apical  akinesis, ejection fraction 25-35% range  . Dyslipidemia   . Ejection fraction < 50%    EF 25-35%, catheterization 2001 / EF 20-25%, global hypokinesis, echo in June, 2012  . GERD (gastroesophageal reflux disease)   . History of bronchitis   . Hypoxia    Pneumonia, hospitalization, June, 2012  . kidney stones   . MI (myocardial infarction) ([place]) 1995  . Pneumonia 2012  . S/P colectomy    Benign tumor   Past Surgical History:  Procedure Laterality Date  . CARDIAC CATHETERIZATION     Patient states he has not had cardiac cath  . CHEST TUBE INSERTION    . COLECTOMY  1981   benign mass  . COLONOSCOPY WITH PROPOFOL N/A 07/04/2014   Procedure: COLONOSCOPY WITH PROPOFOL;  Surgeon: Gatha Mayer, MD;  Location: [facility];  Service: Endoscopy;  Laterality: N/A;  . JOINT REPLACEMENT    . KNEE ARTHROSCOPY Left 07/19/2015   Procedure: LEFT ARTHROSCOPY KNEE WITH MEDIAL MENISCECTOMY;  Surgeon: Latanya Maudlin, MD;  Location: [facility];  Service: Orthopedics;  Laterality: Left;  LMA  . LEG SURGERY Left    cyst  . PLEURAL SCARIFICATION  1978  . SHOULDER OPEN ROTATOR CUFF REPAIR Left 05/26/2015   Procedure: LEFT OPEN ACROMINECTOMY AND REPAIR WITH GRAFT AND ANCHOR ROTATOR CUFF TEAR ;  Surgeon: Latanya Maudlin, MD;  Location: [facility];  Service: Orthopedics;  Laterality: Left;  Left shoulder block in holding  . TOTAL KNEE ARTHROPLASTY Left 01/29/2017   Procedure: LEFT TOTAL KNEE ARTHROPLASTY;  Surgeon: Latanya Maudlin, MD;  Location: [facility];  Service: Orthopedics;  Laterality: Left;  Adductor Block     Current Meds  Medication Sig  . albuterol (PROVENTIL) (2.5 MG/3ML) 0.083%  nebulizer solution Take 3 mLs (2.5 mg total) by nebulization every 6 (six) hours as needed for wheezing or shortness of breath.  Marland Kitchen aspirin 81 MG chewable tablet Chew 81 mg by mouth daily.  Marland Kitchen azithromycin (ZITHROMAX Z-PAK) 250 MG tablet Take as directed  . carvedilol (COREG) 25 MG tablet Take 1 tablet (25 mg total) by mouth 2 (two) times daily.  . cetirizine (ZYRTEC) 10 MG tablet Take 10 mg by mouth daily.  . diclofenac sodium (VOLTAREN) 1 % GEL Apply 1 application topically 3 (three) times daily as needed (for hands).  Marland Kitchen ENTRESTO 49-51 MG Take 1 tablet by mouth 2 (two) times a day.  . finasteride (PROSCAR) 5 MG tablet Take 5 mg by mouth daily.   . fluticasone (FLONASE) 50 MCG/ACT nasal spray Place 1 spray into both nostrils as needed for allergies or rhinitis.  Marland Kitchen HYDROcodone-homatropine (HYCODAN) 5-1.5 MG/5ML syrup Take 5-10 mLs by mouth every 6 (six) hours as needed for cough.  Marland Kitchen omeprazole (PRILOSEC) 20 MG capsule Take 1 capsule (20 mg total) by mouth daily. (Needs to be seen before next refill)  . simvastatin (ZOCOR) 40 MG tablet Take 1 tablet (40 mg total) by mouth daily at 6 PM.  . spironolactone (ALDACTONE) 25 MG tablet Take 1 tablet (25 mg total) by mouth daily.  . tamsulosin (FLOMAX) 0.4 MG CAPS capsule Take 1 capsule (0.4 mg total) by mouth daily after supper.     Allergies:   Ivp dye [iodinated diagnostic agents] and Morphine and related   Social History   Tobacco Use  . Smoking status: Former Smoker    Packs/day: 2.00    Years: 32.00    Pack years: 64.00    Types: Cigarettes    Last attempt to quit: 04/14/1992    Years since quitting: 26.2  . Smokeless tobacco: Never Used  Substance Use Topics  . Alcohol use: No  . Drug use: No     Family Hx: [name] family history includes Cancer in his brother and maternal grandmother; Diabetes in his mother; Hypertension in his mother; Melanoma in his brother; Prostatitis in his brother.  ROS:   Please see the history of  present illness.    No F C V N CP SOB All other systems reviewed and are negative.   Prior CV studies:   The following studies were reviewed today:  Echo Study Conclusions 12/2017 - Left ventricle: The cavity size was severely dilated. There was moderate focal basal hypertrophy of the posterior wall. Systolic function was mildly to moderately reduced. The estimated ejection fraction was in the range of 40% to 45%. Akinesis of the mid-apicalanterior, anterolateral, and apical myocardium. Doppler parameters are consistent with abnormal left ventricular relaxation (grade 1 diastolic dysfunction). Doppler parameters are consistent with indeterminate ventricular filling pressure. - Aortic valve: Transvalvular velocity was within the normal range. There was no stenosis. There was no regurgitation. - Aorta: Ascending aortic diameter: 40 mm (  S). - Ascending aorta: The ascending aorta was mildly dilated. - Mitral valve: Transvalvular velocity was within the normal range. There was no evidence for stenosis. There was trivial regurgitation. - Left atrium: The atrium was severely dilated. - Right ventricle: The cavity size was normal. Wall thickness was normal. Systolic function was normal. - Atrial septum: No defect or patent foramen ovale was identified by color flow Doppler. - Tricuspid valve: There was trivial regurgitation. - Pulmonary arteries: Systolic pressure was within the normal range. PA peak pressure: 19 mm Hg (S). - Pericardium, extracardiac: A trivial pericardial effusion was identified.   Cardiac Cath 05/30/10 ANGIOGRAPHIC DATA: The left main coronary artery is free of significant disease.  The left anterior descending artery courses to the apex. Beyond the origin of the large diagonal branch there is narrowing that measures about 30% in luminal diameter. This does not appear to be high-grade. The LAD courses to the apex and wraps the  apical tip. There was a large diagonal branch with minimal ostial irregularity but no high-grade lesions. There are smaller distal diagonal branches.  The circumflex coronary provides a small first marginal branch that is insignificant. The circumflex consists predominately of a very large marginal branch that courses out over the anterolateral wall and bifurcates distally and is free of critical disease. The AV circumflex is small and provides an atrial circumflex branch.  The right coronary artery is a large dominant vessel. The right coronary artery provides a posterior descending branch which bifurcates and five posterolateral branches. The distal right coronary demonstrates no high-grade lesions.  LEFT VENTRICULOGRAPHY: Ventriculography in the RAO projection reveals anterolateral and apical akinesis. The inferobasal and superior basal segments appear to move. Ejection fraction was calculated at 25% but visually was more in the range of 35%. No significant mitral regurgitation was noted.  CONCLUSIONS: 1. Moderately severe reduction in global left ventricular function. 2. No evidence of re-stenosis in the left anterior descending artery. 3. Continued patency of the circumflex and right coronary arteries.   Labs/Other Tests and Data Reviewed:    EKG:  prior reviewed, NSR  Recent Labs: 02/06/2018: ALT 15; BUN 20; Creatinine, Ser 0.97; Hemoglobin 14.9; Platelets 170; Potassium 4.6; Sodium 141   Recent Lipid Panel Lab Results  Component Value Date/Time   CHOL 146 02/06/2018 10:50 AM   TRIG 78 02/06/2018 10:50 AM   HDL 39 (L) 02/06/2018 10:50 AM   CHOLHDL 3.7 02/06/2018 10:50 AM   LDLCALC 91 02/06/2018 10:50 AM    Wt Readings from Last 3 Encounters:  06/23/18 246 lb (111.6 kg)  03/13/18 255 lb (115.7 kg)  02/06/18 251 lb 12.8 oz (114.2 kg)     Objective:    Vital Signs:  BP 140/80   Ht 5\' 9"  (1.753 m)   Wt 246 lb (111.6 kg)   BMI 36.33 kg/m     VITAL SIGNS:  reviewed pleasant, alert, no SOB  ASSESSMENT & PLAN:    Prior Syncope  - Entresto related, and resolved after reduced dose. Doing well now  Chronic systolic HF  - EF improved from 30 to 45%  - previously declined ICD. Meds are excellent.   CAD  - MI 1995  - no angina  Essential HTN  - stable now  Hyperlipidemia  - statin for secondary prevention  - LDL 91, goal <70. Consider change to Crestor or atorvastatin.   COVID-19 Education: The signs and symptoms of COVID-19 were discussed with the patient and how to seek care for testing (follow  up with PCP or arrange E-visit).  The importance of social distancing was discussed today.  Time:   Today, I have spent 12 minutes with the patient with telehealth technology discussing the above problems.     Medication Adjustments/Labs and Tests Ordered: Current medicines are reviewed at length with the patient today.  Concerns regarding medicines are outlined above.   Tests Ordered: No orders of the defined types were placed in this encounter.   Medication Changes: No orders of the defined types were placed in this encounter.   Disposition:  Follow up in 6 month(s)  Signed, Candee Furbish, MD  06/23/2018 9:55 AM    [facility]

## 2018-06-23 NOTE — Patient Instructions (Signed)
Medication Instructions:  The current medical regimen is effective;  continue present plan and medications.  If you need a refill on your cardiac medications before your next appointment, please call your pharmacy.   Follow-Up: Follow up in 6 months with Dr. Skains.  You will receive a letter in the mail 2 months before you are due.  Please call us when you receive this letter to schedule your follow up appointment.   Thank you for choosing [facility]!!       

## 2018-06-24 ENCOUNTER — Ambulatory Visit: Payer: Medicare Other | Admitting: Cardiology

## 2018-07-01 ENCOUNTER — Other Ambulatory Visit: Payer: Self-pay | Admitting: Physician Assistant

## 2018-07-02 ENCOUNTER — Telehealth: Payer: Self-pay | Admitting: Physician Assistant

## 2018-07-07 ENCOUNTER — Ambulatory Visit: Payer: Medicare Other

## 2018-07-15 ENCOUNTER — Ambulatory Visit: Payer: Medicare Other

## 2018-07-31 ENCOUNTER — Ambulatory Visit (INDEPENDENT_AMBULATORY_CARE_PROVIDER_SITE_OTHER): Payer: Medicare Other | Admitting: *Deleted

## 2018-07-31 VITALS — Ht 69.0 in | Wt 246.0 lb

## 2018-07-31 DIAGNOSIS — Z0000 Encounter for general adult medical examination without abnormal findings: Secondary | ICD-10-CM

## 2018-07-31 NOTE — Patient Instructions (Signed)
Tony Patrick , Thank you for taking time to come for your Medicare Wellness Visit. I appreciate your ongoing commitment to your health goals. Please review the following plan we discussed and let me know if I can assist you in the future.   These are the goals we discussed: Goals    . Exercise 3x per week (30 min per time)     Try to exercise for at least 30 minutes, 3 times weekly    . Have 3 meals a day     Try to eat at least 3 meals daily that consist of lean proteins, fruits and vegetables        This is a list of the screening recommended for you and due dates:  Health Maintenance  Topic Date Due  .  Hepatitis C: One time screening is recommended by Center for Disease Control  (CDC) for  adults born from [birth year] through 1967.   [birth year]  . Tetanus Vaccine  09/26/1967  . Flu Shot  08/15/2018  . Colon Cancer Screening  07/03/2024  . Pneumonia vaccines  Completed     Advance Directive  Advance directives are legal documents that let you make choices ahead of time about your health care and medical treatment in case you become unable to communicate for yourself. Advance directives are a way for you to communicate your wishes to family, friends, and health care providers. This can help convey your decisions about end-of-life care if you become unable to communicate. Discussing and writing advance directives should happen over time rather than all at once. Advance directives can be changed depending on your situation and what you want, even after you have signed the advance directives. If you do not have an advance directive, some states assign family decision makers to act on your behalf based on how closely you are related to them. Each state has its own laws regarding advance directives. You may want to check with your health care provider, attorney, or state representative about the laws in your state. There are different types of advance directives, such as:  Medical power of  attorney.  Living will.  Do not resuscitate (DNR) or do not attempt resuscitation (DNAR) order. Health care proxy and medical power of attorney A health care proxy, also called a health care agent, is a person who is appointed to make medical decisions for you in cases in which you are unable to make the decisions yourself. Generally, people choose someone they know well and trust to represent their preferences. Make sure to ask this person for an agreement to act as your proxy. A proxy may have to exercise judgment in the event of a medical decision for which your wishes are not known. A medical power of attorney is a legal document that names your health care proxy. Depending on the laws in your state, after the document is written, it may also need to be:  Signed.  Notarized.  Dated.  Copied.  Witnessed.  Incorporated into your medical record. You may also want to appoint someone to manage your financial affairs in a situation in which you are unable to do so. This is called a durable power of attorney for finances. It is a separate legal document from the durable power of attorney for health care. You may choose the same person or someone different from your health care proxy to act as your agent in financial matters. If you do not appoint a proxy, or if there is a  concern that the proxy is not acting in your best interests, a court-appointed guardian may be designated to act on your behalf. Living will A living will is a set of instructions documenting your wishes about medical care when you cannot express them yourself. Health care providers should keep a copy of your living will in your medical record. You may want to give a copy to family members or friends. To alert caregivers in case of an emergency, you can place a card in your wallet to let them know that you have a living will and where they can find it. A living will is used if you become:  Terminally ill.  Incapacitated.   Unable to communicate or make decisions. Items to consider in your living will include:  The use or non-use of life-sustaining equipment, such as dialysis machines and breathing machines (ventilators).  A DNR or DNAR order, which is the instruction not to use cardiopulmonary resuscitation (CPR) if breathing or heartbeat stops.  The use or non-use of tube feeding.  Withholding of food and fluids.  Comfort (palliative) care when the goal becomes comfort rather than a cure.  Organ and tissue donation. A living will does not give instructions for distributing your money and property if you should pass away. It is recommended that you seek the advice of a lawyer when writing a will. Decisions about taxes, beneficiaries, and asset distribution will be legally binding. This process can relieve your family and friends of any concerns surrounding disputes or questions that may come up about the distribution of your assets. DNR or DNAR A DNR or DNAR order is a request not to have CPR in the event that your heart stops beating or you stop breathing. If a DNR or DNAR order has not been made and shared, a health care provider will try to help any patient whose heart has stopped or who has stopped breathing. If you plan to have surgery, talk with your health care provider about how your DNR or DNAR order will be followed if problems occur. Summary  Advance directives are the legal documents that allow you to make choices ahead of time about your health care and medical treatment in case you become unable to communicate for yourself.  The process of discussing and writing advance directives should happen over time. You can change the advance directives, even after you have signed them.  Advance directives include DNR or DNAR orders, living wills, and designating an agent as your medical power of attorney. This information is not intended to replace advice given to you by your health care provider. Make sure  you discuss any questions you have with your health care provider. Document Released: 04/09/2007 Document Revised: 02/04/2018 Document Reviewed: 11/20/2015 Elsevier Patient Education  [place].    BMI for Adults  Body mass index (BMI) is a number that is calculated from a person's weight and height. BMI may help to estimate how much of a person's weight is composed of fat. BMI can help identify those who may be at higher risk for certain medical problems. How is BMI used with adults? BMI is used as a screening tool to identify possible weight problems. It is used to check whether a person is obese, overweight, healthy weight, or underweight. How is BMI calculated? BMI measures your weight and compares it to your height. This can be done either in [place] ([place] or metric measurements. Note that charts are available to help you find your BMI quickly  and easily without having to do these calculations yourself. To calculate your BMI in [place] (U[place] measurements, your health care provider will: 1. Measure your weight in pounds (lb). 2. Multiply the number of pounds by 703. ? For example, for a person who weighs 180 lb, multiply that number by 703, which equals 126,540. 3. Measure your height in inches (in). Then multiply that number by itself to get a measurement called "inches squared." ? For example, for a person who is 70 in tall, the "inches squared" measurement is 70 in x 70 in, which equals 4900 inches squared. 4. Divide the total from Step 2 (number of lb x 703) by the total from Step 3 (inches squared): 126,540  4900 = 25.8. This is your BMI. To calculate your BMI in metric measurements, your health care provider will: 1. Measure your weight in kilograms (kg). 2. Measure your height in meters (m). Then multiply that number by itself to get a measurement called "meters squared." ? For example, for a person who is 1.75 m tall, the "meters squared" measurement is 1.75 m x 1.75 m,  which is equal to 3.1 meters squared. 3. Divide the number of kilograms (your weight) by the meters squared number. In this example: 70  3.1 = 22.6. This is your BMI. How is BMI interpreted? To interpret your results, your health care provider will use BMI charts to identify whether you are underweight, normal weight, overweight, or obese. The following guidelines will be used:  Underweight: BMI less than 18.5.  Normal weight: BMI between 18.5 and 24.9.  Overweight: BMI between 25 and 29.9.  Obese: BMI of 30 and above. Please note:  Weight includes both fat and muscle, so someone with a muscular build, such as an athlete, may have a BMI that is higher than 24.9. In cases like these, BMI is not an accurate measure of body fat.  To determine if excess body fat is the cause of a BMI of 25 or higher, further assessments may need to be done by a health care provider.  BMI is usually interpreted in the same way for men and women. Why is BMI a useful tool? BMI is useful in two ways:  Identifying a weight problem that may be related to a medical condition, or that may increase the risk for medical problems.  Promoting lifestyle and diet changes in order to reach a healthy weight. Summary  Body mass index (BMI) is a number that is calculated from a person's weight and height.  BMI may help to estimate how much of a person's weight is composed of fat. BMI can help identify those who may be at higher risk for certain medical problems.  BMI can be measured using English measurements or metric measurements.  To interpret your results, your health care provider will use BMI charts to identify whether you are underweight, normal weight, overweight, or obese. This information is not intended to replace advice given to you by your health care provider. Make sure you discuss any questions you have with your health care provider. Document Released: 09/12/2003 Document Revised: 12/13/2016 Document  Reviewed: 11/13/2016 Elsevier Patient Education  2020 [employer].

## 2018-07-31 NOTE — Progress Notes (Signed)
MEDICARE ANNUAL WELLNESS VISIT  07/31/2018  Telephone Visit Disclaimer This Medicare AWV was conducted by telephone due to national recommendations for restrictions regarding the COVID-19 Pandemic (e.g. social distancing).  I verified, using two identifiers, that I am speaking with Tony Patrick or their authorized healthcare agent. I discussed the limitations, risks, security, and privacy concerns of performing an evaluation and management service by telephone and the potential availability of an in-person appointment in the future. The patient expressed understanding and agreed to proceed.   Subjective:  Tony Patrick is a 68 y.o. male patient of Terald Sleeper, PA-C who had a Medicare Annual Wellness Visit today via telephone. Tony Patrick is Retired and lives with their family. he has 1 child. he reports that he is socially active and does interact with friends/family regularly. he is not physically active and enjoys woodworking.  Patient Care Team: Theodoro Clock as PCP - General (Physician Assistant) Jerline Pain, MD as PCP - Cardiology (Cardiology)  Advanced Directives 07/31/2018 01/06/2018 01/29/2017 01/29/2017 01/20/2017 10/22/2015 07/19/2015  Does Patient Have a Medical Advance Directive? No No Yes Yes Yes No -  Type of Advance Directive - - Living will Living will Living will - -  Does patient want to make changes to medical advance directive? - - No - Patient declined No - Patient declined No - Patient declined - -  Copy of [place] in Chart? - - - - - - No - copy requested  Would patient like information on creating a medical advance directive? Yes (MAU/Ambulatory/Procedural Areas - Information given) No - Patient declined - - - - [facility] Utilization Over the Past 12 Months: # of hospitalizations or ER visits: 0 # of surgeries: 0  Review of Systems    Patient reports that his overall health is unchanged compared to last year.  Patient Reported  Readings (BP, Pulse, CBG, Weight, etc) none  Review of Systems: History obtained from chart review and the patient General ROS: negative  All other systems negative.  Pain Assessment Pain : No/denies pain     Current Medications & Allergies (verified) Allergies as of 07/31/2018      Reactions   Ivp Dye [iodinated Diagnostic Agents] Swelling, Other (See Comments)   Arrest   Morphine And Related Nausea And Vomiting      Medication List       Accurate as of July 31, 2018  3:20 PM. If you have any questions, ask your nurse or doctor.        STOP taking these medications   azithromycin 250 MG tablet Commonly known as: Zithromax Z-Pak   HYDROcodone-homatropine 5-1.5 MG/5ML syrup Commonly known as: HYCODAN     TAKE these medications   albuterol (2.5 MG/3ML) 0.083% nebulizer solution Commonly known as: PROVENTIL Take 3 mLs (2.5 mg total) by nebulization every 6 (six) hours as needed for wheezing or shortness of breath.   aspirin 81 MG chewable tablet Chew 81 mg by mouth daily.   carvedilol 25 MG tablet Commonly known as: COREG Take 1 tablet (25 mg total) by mouth 2 (two) times daily.   cetirizine 10 MG tablet Commonly known as: ZYRTEC TAKE 1 TABLET DAILY   diclofenac sodium 1 % Gel Commonly known as: VOLTAREN Apply 1 application topically 3 (three) times daily as needed (for hands).   Entresto 49-51 MG Generic drug: sacubitril-valsartan Take 1 tablet by mouth 2 (two) times a day.   finasteride 5 MG  tablet Commonly known as: PROSCAR Take 5 mg by mouth daily.   fluticasone 50 MCG/ACT nasal spray Commonly known as: FLONASE Place 1 spray into both nostrils as needed for allergies or rhinitis.   omeprazole 20 MG capsule Commonly known as: PRILOSEC Take 1 capsule (20 mg total) by mouth daily. (Needs to be seen before next refill)   simvastatin 40 MG tablet Commonly known as: ZOCOR Take 1 tablet (40 mg total) by mouth daily at 6 PM.   spironolactone 25 MG  tablet Commonly known as: ALDACTONE Take 1 tablet (25 mg total) by mouth daily.   tamsulosin 0.4 MG Caps capsule Commonly known as: FLOMAX Take 1 capsule (0.4 mg total) by mouth daily after supper.       History (reviewed): Past Medical History:  Diagnosis Date  . Arthritis   . Atherosclerotic plaque   . Basal cell carcinoma   . CAD (coronary artery disease)    Anterior MI, [facility], 1995, no PCI, total LAD  / catheterization 2001, 30% LAD beyond the origin of a large diagonal, circumflex normal, RCA normal, anterolateral and apical  akinesis, ejection fraction 25-35% range  . Dyslipidemia   . Ejection fraction < 50%    EF 25-35%, catheterization 2001 / EF 20-25%, global hypokinesis, echo in June, 2012  . GERD (gastroesophageal reflux disease)   . History of bronchitis   . Hypoxia    Pneumonia, hospitalization, June, 2012  . kidney stones   . MI (myocardial infarction) ([place]) 1995  . Pneumonia 2012  . S/P colectomy    Benign tumor   Past Surgical History:  Procedure Laterality Date  . CARDIAC CATHETERIZATION     Patient states he has not had cardiac cath  . CHEST TUBE INSERTION    . COLECTOMY  1981   benign mass  . COLONOSCOPY WITH PROPOFOL N/A 07/04/2014   Procedure: COLONOSCOPY WITH PROPOFOL;  Surgeon: Gatha Mayer, MD;  Location: [facility];  Service: Endoscopy;  Laterality: N/A;  . JOINT REPLACEMENT    . KNEE ARTHROSCOPY Left 07/19/2015   Procedure: LEFT ARTHROSCOPY KNEE WITH MEDIAL MENISCECTOMY;  Surgeon: Latanya Maudlin, MD;  Location: [facility];  Service: Orthopedics;  Laterality: Left;  LMA  . LEG SURGERY Left    cyst  . PLEURAL SCARIFICATION  1978  . SHOULDER OPEN ROTATOR CUFF REPAIR Left 05/26/2015   Procedure: LEFT OPEN ACROMINECTOMY AND REPAIR WITH GRAFT AND ANCHOR ROTATOR CUFF TEAR ;  Surgeon: Latanya Maudlin, MD;  Location: [facility];  Service: Orthopedics;  Laterality: Left;  Left shoulder block in holding  . TOTAL KNEE ARTHROPLASTY Left 01/29/2017   Procedure: LEFT  TOTAL KNEE ARTHROPLASTY;  Surgeon: Latanya Maudlin, MD;  Location: [facility];  Service: Orthopedics;  Laterality: Left;  Adductor Block   Family History  Problem Relation Age of Onset  . Diabetes Mother   . Hypertension Mother   . Cancer Maternal Grandmother        type unknown  . Melanoma Brother   . Cancer Brother        type unknown  . Prostatitis Brother    Social History   Socioeconomic History  . Marital status: Married    Spouse name: Tammi  . Number of children: 1  . Years of education: Not on file  . Highest education level: Some college, no degree  Occupational History  . Occupation: retired  Scientific laboratory technician  . Financial resource strain: Not hard at all  . Food insecurity    Worry: Never true  Inability: Never true  . Transportation needs    Medical: No    Non-medical: No  Tobacco Use  . Smoking status: Former Smoker    Packs/day: 2.00    Years: 32.00    Pack years: 64.00    Types: Cigarettes    Quit date: 04/14/1992    Years since quitting: 26.3  . Smokeless tobacco: Never Used  Substance and Sexual Activity  . Alcohol use: No  . Drug use: No  . Sexual activity: Yes  Lifestyle  . Physical activity    Days per week: 0 days    Minutes per session: 0 min  . Stress: Not at all  Relationships  . Social connections    Talks on phone: More than three times a week    Gets together: More than three times a week    Attends religious service: More than 4 times per year    Active member of club or organization: Yes    Attends meetings of clubs or organizations: More than 4 times per year    Relationship status: Married  Other Topics Concern  . 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 N -  Vision? N -  Difficulty concentrating or making  decisions? N -  Walking or climbing stairs? N -  Dressing or bathing? N -  Doing errands, shopping? N N  Preparing Food and eating ? N -  Using the Toilet? N -  In the past six months, have you accidently leaked urine? N -  Do you have problems with loss of bowel control? N -  Managing your Medications? N -  Managing your Finances? N -  Housekeeping or managing your Housekeeping? N -  Some recent data might be hidden    Patient Education/ Literacy How often do you need to have someone help you when you read instructions, pamphlets, or other written materials from your doctor or pharmacy?: 1 - Never What is the last grade level you completed in school?: Some college  Exercise Current Exercise Habits: The patient does not participate in regular exercise at present, Exercise limited by: None identified  Diet Patient reports consuming 2 meals a day and 2 snack(s) a day Patient reports that his primary diet is: Regular Patient reports that she does have regular access to food.   Depression Screen PHQ 2/9 Scores 07/31/2018 03/13/2018 02/06/2018 11/06/2017 03/13/2017 05/14/2016 03/04/2016  PHQ - 2 Score 0 0 0 0 0 0 0     Fall Risk Fall Risk  07/31/2018 03/13/2018 02/06/2018 11/06/2017 03/13/2017  Falls in the past year? 0 0 0 No No  Number falls in past yr: 0 - - - -  Injury with Fall? 0 - - - -  Comment - - - - -  Risk Factor Category  - - - - -  Follow up - - - - -     Objective:  Tony Patrick seemed alert and oriented and he participated appropriately during our telephone visit.  Blood Pressure Weight BMI  BP Readings from Last 3 Encounters:  06/23/18 140/80  03/13/18 110/70  02/06/18 110/72   Wt Readings from Last 3 Encounters:  07/31/18 246 lb (111.6 kg)  06/23/18 246 lb (111.6 kg)  03/13/18 255 lb (115.7  kg)   BMI Readings from Last 1 Encounters:  07/31/18 36.33 kg/m    *Unable to obtain current vital signs, weight, and BMI due to telephone visit type  Hearing/Vision   . Sohan did not seem to have difficulty with hearing/understanding during the telephone conversation . Reports that he has not had a formal eye exam by an eye care professional within the past year . Reports that he has not had a formal hearing evaluation within the past year *Unable to fully assess hearing and vision during telephone visit type  Cognitive Function: 6CIT Screen 07/31/2018  What Year? 0 points  What month? 0 points  What time? 0 points  Count back from 20 0 points  Months in reverse 0 points  Repeat phrase 0 points  Total Score 0   (Normal:0-7, Significant for Dysfunction: >8)  Normal Cognitive Function Screening: Yes   Immunization & Health Maintenance Record Immunization History  Administered Date(s) Administered  . Influenza, High Dose Seasonal PF 11/21/2016  . Influenza,inj,Quad PF,6+ Mos 12/14/2015  . Pneumococcal Conjugate-13 11/21/2016  . Pneumococcal Polysaccharide-23 12/14/2015    Health Maintenance  Topic Date Due  . Hepatitis C Screening  [birth year]  . TETANUS/TDAP  09/26/1967  . INFLUENZA VACCINE  08/15/2018  . COLONOSCOPY  07/03/2024  . PNA vac Low Risk Adult  Completed       Assessment  This is a routine wellness examination for Tony Patrick.  Health Maintenance: Due or Overdue Health Maintenance Due  Topic Date Due  . Hepatitis C Screening  11-18-1950  . TETANUS/TDAP  09/26/1967    Tony Patrick does not need a referral for Community Assistance: Care Management:   no Social Work:    no Prescription Assistance:  no Nutrition/Diabetes Education:  no   Plan:  Personalized Goals Goals Addressed            This Visit's Progress   . Exercise 3x per week (30 min per time)       Try to exercise for at least 30 minutes, 3 times weekly    . Have 3 meals a day       Try to eat at least 3 meals daily that consist of lean proteins, fruits and vegetables       Personalized Health Maintenance & Screening Recommendations   Colorectal cancer screening Advanced directives: has NO advanced directive  - add't info requested. Referral to SW: no Shingrix  Lung Cancer Screening Recommended: no (Low Dose CT Chest recommended if Age 50-80 years, 30 pack-year currently smoking OR have quit w/in past 15 years) Hepatitis C Screening recommended: yes HIV Screening recommended: no  Advanced Directives: Written information was prepared per patient's request.  Referrals & Orders No orders of the defined types were placed in this encounter.   Follow-up Plan . Follow-up with Terald Sleeper, PA-C as planned   I have personally reviewed and noted the following in the patient's chart:   . Medical and social history . Use of alcohol, tobacco or illicit drugs  . Current medications and supplements . Functional ability and status . Nutritional status . Physical activity . Advanced directives . List of other physicians . Hospitalizations, surgeries, and ER visits in previous 12 months . Vitals . Screenings to include cognitive, depression, and falls . Referrals and appointments  In addition, I have reviewed and discussed with Tony Patrick certain preventive protocols, quality metrics, and best practice recommendations. A written personalized care plan for preventive services as well as general preventive  health recommendations is available and can be mailed to the patient at his request.      Wardell Heath, LPN  [id number]

## 2018-08-13 ENCOUNTER — Other Ambulatory Visit: Payer: Self-pay | Admitting: Physician Assistant

## 2018-09-01 DIAGNOSIS — R972 Elevated prostate specific antigen [PSA]: Secondary | ICD-10-CM | POA: Diagnosis not present

## 2018-09-07 DIAGNOSIS — N5201 Erectile dysfunction due to arterial insufficiency: Secondary | ICD-10-CM | POA: Diagnosis not present

## 2018-09-07 DIAGNOSIS — N401 Enlarged prostate with lower urinary tract symptoms: Secondary | ICD-10-CM | POA: Diagnosis not present

## 2018-09-07 DIAGNOSIS — R3912 Poor urinary stream: Secondary | ICD-10-CM | POA: Diagnosis not present

## 2018-09-18 ENCOUNTER — Telehealth: Payer: Self-pay | Admitting: Physician Assistant

## 2018-09-18 NOTE — Telephone Encounter (Signed)
Aware provider is out for vacation. Patient was getting a script for ibuprofen 800 mg and was taking at night to help with arthritic pain.  It was stopped for some reason and he would like for provider to send in another script.

## 2018-09-21 ENCOUNTER — Other Ambulatory Visit: Payer: Self-pay | Admitting: Physician Assistant

## 2018-09-21 MED ORDER — IBUPROFEN 800 MG PO TABS: 800.0000 mg | ORAL_TABLET | Freq: Three times a day (TID) | ORAL | 3 refills | Status: DC | PRN

## 2018-09-21 NOTE — Telephone Encounter (Signed)
sent 

## 2018-10-29 ENCOUNTER — Other Ambulatory Visit: Payer: Self-pay | Admitting: Cardiology

## 2018-11-01 ENCOUNTER — Other Ambulatory Visit: Payer: Self-pay | Admitting: Cardiology

## 2018-11-01 DIAGNOSIS — I2510 Atherosclerotic heart disease of native coronary artery without angina pectoris: Secondary | ICD-10-CM

## 2018-11-25 ENCOUNTER — Ambulatory Visit (INDEPENDENT_AMBULATORY_CARE_PROVIDER_SITE_OTHER): Payer: Medicare Other | Admitting: Physician Assistant

## 2018-11-25 DIAGNOSIS — I2510 Atherosclerotic heart disease of native coronary artery without angina pectoris: Secondary | ICD-10-CM

## 2018-11-25 DIAGNOSIS — E785 Hyperlipidemia, unspecified: Secondary | ICD-10-CM

## 2018-11-25 DIAGNOSIS — I10 Essential (primary) hypertension: Secondary | ICD-10-CM

## 2018-11-25 DIAGNOSIS — R350 Frequency of micturition: Secondary | ICD-10-CM

## 2018-11-25 DIAGNOSIS — N401 Enlarged prostate with lower urinary tract symptoms: Secondary | ICD-10-CM | POA: Diagnosis not present

## 2018-11-25 DIAGNOSIS — J309 Allergic rhinitis, unspecified: Secondary | ICD-10-CM | POA: Diagnosis not present

## 2018-11-25 MED ORDER — FLUTICASONE PROPIONATE 50 MCG/ACT NA SUSP: 1.0000 | NASAL | 3 refills | Status: AC | PRN

## 2018-11-25 MED ORDER — CETIRIZINE HCL 10 MG PO TABS: 10.0000 mg | ORAL_TABLET | Freq: Every day | ORAL | 3 refills | Status: DC

## 2018-11-25 MED ORDER — IBUPROFEN 800 MG PO TABS: 800.0000 mg | ORAL_TABLET | Freq: Three times a day (TID) | ORAL | 3 refills | Status: DC | PRN

## 2018-11-25 MED ORDER — OMEPRAZOLE 20 MG PO CPDR: 20.0000 mg | DELAYED_RELEASE_CAPSULE | Freq: Every day | ORAL | 3 refills | Status: DC

## 2018-11-25 MED ORDER — SIMVASTATIN 40 MG PO TABS: 40.0000 mg | ORAL_TABLET | Freq: Every day | ORAL | 3 refills | Status: DC

## 2018-11-29 ENCOUNTER — Encounter: Payer: Self-pay | Admitting: Physician Assistant

## 2018-11-29 NOTE — Progress Notes (Signed)
Telephone visit  Subjective: CC: Recheck on chronic medical conditions PCP: Terald Sleeper, PA-C [id number] Tony Patrick is a 68 y.o. male calls for telephone consult today. Patient provides verbal consent for consult held via phone.  Patient is identified with 2 separate identifiers.  At this time the entire area is on COVID-19 social distancing and stay home orders are in place.  Patient is of higher risk and therefore we are performing this by a virtual method.  Location of patient: Home Location of provider: HOME Others present for call: No  1.  This patient is have a follow-up for his chronic medical conditions.  All his medications are reviewed.  Labs will be placed for him to come in the near future.  Patient does have a past medical history of coronary artery disease, hypertension, dyslipidemia, BPH, allergic rhinitis.  He does follow with cardiology.  He has had orthopedics in the past.  He states overall he is doing well right now.   ROS: Per HPI  Allergies  Allergen Reactions  . Ivp Dye [Iodinated Diagnostic Agents] Swelling and Other (See Comments)    Arrest  . Morphine And Related Nausea And Vomiting   Past Medical History:  Diagnosis Date  . Arthritis   . Atherosclerotic plaque   . Basal cell carcinoma   . CAD (coronary artery disease)    Anterior MI, [facility], 1995, no PCI, total LAD  / catheterization 2001, 30% LAD beyond the origin of a large diagonal, circumflex normal, RCA normal, anterolateral and apical  akinesis, ejection fraction 25-35% range  . Dyslipidemia   . Ejection fraction < 50%    EF 25-35%, catheterization 2001 / EF 20-25%, global hypokinesis, echo in June, 2012  . GERD (gastroesophageal reflux disease)   . History of bronchitis   . Hypoxia    Pneumonia, hospitalization, June, 2012  . kidney stones   . MI (myocardial infarction) ([place]) 1995  . Pneumonia 2012  . S/P colectomy    Benign tumor    Current Outpatient Medications:  .  albuterol  (PROVENTIL) (2.5 MG/3ML) 0.083% nebulizer solution, Take 3 mLs (2.5 mg total) by nebulization every 6 (six) hours as needed for wheezing or shortness of breath., Disp: 150 mL, Rfl: 1 .  aspirin 81 MG chewable tablet, Chew 81 mg by mouth daily., Disp: , Rfl:  .  carvedilol (COREG) 25 MG tablet, TAKE 1 TABLET TWICE A DAY, Disp: 180 tablet, Rfl: 2 .  cetirizine (ZYRTEC) 10 MG tablet, Take 1 tablet (10 mg total) by mouth daily., Disp: 90 tablet, Rfl: 3 .  diclofenac sodium (VOLTAREN) 1 % GEL, Apply 1 application topically 3 (three) times daily as needed (for hands)., Disp: 1200 g, Rfl: 3 .  ENTRESTO 49-51 MG, Take 1 tablet by mouth 2 (two) times a day., Disp: , Rfl:  .  finasteride (PROSCAR) 5 MG tablet, Take 5 mg by mouth daily. , Disp: , Rfl:  .  fluticasone (FLONASE) 50 MCG/ACT nasal spray, Place 1 spray into both nostrils as needed for allergies or rhinitis., Disp: 48 g, Rfl: 3 .  ibuprofen (ADVIL) 800 MG tablet, Take 1 tablet (800 mg total) by mouth every 8 (eight) hours as needed., Disp: 270 tablet, Rfl: 3 .  omeprazole (PRILOSEC) 20 MG capsule, Take 1 capsule (20 mg total) by mouth daily. (Needs to be seen before next refill), Disp: 90 capsule, Rfl: 3 .  simvastatin (ZOCOR) 40 MG tablet, Take 1 tablet (40 mg total) by mouth daily at 6  PM., Disp: 90 tablet, Rfl: 3 .  spironolactone (ALDACTONE) 25 MG tablet, TAKE 1 TABLET DAILY, Disp: 90 tablet, Rfl: 2 .  tamsulosin (FLOMAX) 0.4 MG CAPS capsule, Take 1 capsule (0.4 mg total) by mouth daily after supper., Disp: 90 capsule, Rfl: 3  Assessment/ Plan: 68 y.o. male   1. Coronary artery disease involving native coronary artery, angina presence unspecified, unspecified whether native or transplanted heart - simvastatin (ZOCOR) 40 MG tablet; Take 1 tablet (40 mg total) by mouth daily at 6 PM.  Dispense: 90 tablet; Refill: 3 - CBC with Differential/Platelet; Future - CMP14+EGFR; Future - Lipid Panel; Future - PSA; Future  2. Essential hypertension -  CBC with Differential/Platelet; Future - CMP14+EGFR; Future  3. Dyslipidemia - Lipid Panel; Future  4. Benign prostatic hyperplasia with urinary frequency - PSA; Future  5. Allergic rhinitis, unspecified seasonality, unspecified trigger - fluticasone (FLONASE) 50 MCG/ACT nasal spray; Place 1 spray into both nostrils as needed for allergies or rhinitis.  Dispense: 48 g; Refill: 3 - cetirizine (ZYRTEC) 10 MG tablet; Take 1 tablet (10 mg total) by mouth daily.  Dispense: 90 tablet; Refill: 3   No follow-ups on file.  Continue all other maintenance medications as listed above.  Start time: 8:05 AM End time: 8:15 AM  Meds ordered this encounter  Medications  . ibuprofen (ADVIL) 800 MG tablet    Sig: Take 1 tablet (800 mg total) by mouth every 8 (eight) hours as needed.    Dispense:  270 tablet    Refill:  3    Order Specific Question:   Supervising Provider    Answer:   Janora Norlander [[id number]]  . fluticasone (FLONASE) 50 MCG/ACT nasal spray    Sig: Place 1 spray into both nostrils as needed for allergies or rhinitis.    Dispense:  48 g    Refill:  3    Order Specific Question:   Supervising Provider    Answer:   Janora Norlander [[id number]]  . omeprazole (PRILOSEC) 20 MG capsule    Sig: Take 1 capsule (20 mg total) by mouth daily. (Needs to be seen before next refill)    Dispense:  90 capsule    Refill:  3    Order Specific Question:   Supervising Provider    Answer:   Janora Norlander [[id number]]  . simvastatin (ZOCOR) 40 MG tablet    Sig: Take 1 tablet (40 mg total) by mouth daily at 6 PM.    Dispense:  90 tablet    Refill:  3    Order Specific Question:   Supervising Provider    Answer:   Janora Norlander [[id number]]  . cetirizine (ZYRTEC) 10 MG tablet    Sig: Take 1 tablet (10 mg total) by mouth daily.    Dispense:  90 tablet    Refill:  3    Order Specific Question:   Supervising Provider    Answer:   Janora Norlander [[id number]]    Particia Nearing PA-C  [place] [phone]

## 2018-12-02 ENCOUNTER — Other Ambulatory Visit: Payer: Self-pay | Admitting: Physician Assistant

## 2018-12-02 MED ORDER — HYDROCODONE-HOMATROPINE 5-1.5 MG/5ML PO SYRP: 5.0000 mL | ORAL_SOLUTION | Freq: Four times a day (QID) | ORAL | 0 refills | Status: DC | PRN

## 2018-12-02 NOTE — Telephone Encounter (Signed)
What is the name of the medication? Hydrocodone cough medication   Have you contacted your pharmacy to request a refill? NO  Which pharmacy would you like [facility] sent [facility]    Patient notified that their request is being sent to the clinical staff for review and that they should receive a call once it is complete. If they do not receive a call within 24 hours they can check with their pharmacy or our office.

## 2018-12-02 NOTE — Telephone Encounter (Signed)
I have sent bottle of Hycodan to his pharmacy.

## 2018-12-02 NOTE — Telephone Encounter (Signed)
Patient aware.

## 2018-12-14 ENCOUNTER — Telehealth: Payer: Self-pay | Admitting: Physician Assistant

## 2018-12-14 NOTE — Chronic Care Management (AMB) (Signed)
°  Chronic Care Management   Outreach Note  12/14/2018 Name: Tony Patrick MRN: [id number] DOB: [birth year]  Referred by: Terald Sleeper, PA-C Reason for referral : Chronic Care Management (Initial CCM outreach was unsuccessful. )   An unsuccessful telephone outreach was attempted today. The patient was referred to the case management team by for assistance with care management and care coordination.   Follow Up Plan: A HIPPA compliant phone message was left for the patient providing contact information and requesting a return call.  The care management team will reach out to the patient again over the next 7 days.  If patient returns call to provider office, please advise to call [place] at [place], [place] [facility]  [place],  [address] Direct Dial: [email or web address]  Website: [email or web address]

## 2018-12-22 NOTE — Chronic Care Management (AMB) (Signed)
Chronic Care Management   Note  12/22/2018 Name: [name] MRN: [id number] DOB: 1950/07/04  Tony Patrick is a 68 y.o. year old male who is a primary care patient of Theodoro Clock. I reached out to [name] by phone today in response to a referral sent by [name]'s health plan.     [name] was given information about Chronic Care Management services today including:  1. CCM service includes personalized support from designated clinical staff supervised by his physician, including individualized plan of care and coordination with other care providers 2. 24/7 contact phone numbers for assistance for urgent and routine care needs. 3. Service will only be billed when office clinical staff spend 20 minutes or more in a month to coordinate care. 4. Only one practitioner may furnish and bill the service in a calendar month. 5. The patient may stop CCM services at any time (effective at the end of the month) by phone call to the office staff. 6. The patient will be responsible for cost sharing (co-pay) of up to 20% of the service fee (after annual deductible is met).  Patient did not agree to services and wishes to discuss with PCP first before deciding about enrollment in care management services.   Follow up plan: The care management team is available to follow up with the patient after provider conversation with the patient regarding recommendation for care management engagement and subsequent re-referral to the care management team.   [place], [place], [place] [address] Direct Dial: [email or web address]  Website: [email or web address]

## 2018-12-31 ENCOUNTER — Other Ambulatory Visit: Payer: Self-pay

## 2018-12-31 ENCOUNTER — Other Ambulatory Visit: Payer: Medicare Other

## 2018-12-31 DIAGNOSIS — I10 Essential (primary) hypertension: Secondary | ICD-10-CM

## 2018-12-31 DIAGNOSIS — N401 Enlarged prostate with lower urinary tract symptoms: Secondary | ICD-10-CM

## 2018-12-31 DIAGNOSIS — E785 Hyperlipidemia, unspecified: Secondary | ICD-10-CM

## 2018-12-31 DIAGNOSIS — R350 Frequency of micturition: Secondary | ICD-10-CM

## 2018-12-31 DIAGNOSIS — I2510 Atherosclerotic heart disease of native coronary artery without angina pectoris: Secondary | ICD-10-CM

## 2019-01-01 LAB — CBC WITH DIFFERENTIAL/PLATELET
Basophils Absolute: 0 10*3/uL (ref 0.0–0.2)
Basos: 0 %
EOS (ABSOLUTE): 0.2 10*3/uL (ref 0.0–0.4)
Eos: 3 %
Hematocrit: 45.1 % (ref 37.5–51.0)
Hemoglobin: 15.3 g/dL (ref 13.0–17.7)
Immature Grans (Abs): 0 10*3/uL (ref 0.0–0.1)
Immature Granulocytes: 0 %
Lymphocytes Absolute: 1.4 10*3/uL (ref 0.7–3.1)
Lymphs: 19 %
MCH: 30.8 pg (ref 26.6–33.0)
MCHC: 33.9 g/dL (ref 31.5–35.7)
MCV: 91 fL (ref 79–97)
Monocytes Absolute: 0.5 10*3/uL (ref 0.1–0.9)
Monocytes: 7 %
Neutrophils Absolute: 5.3 10*3/uL (ref 1.4–7.0)
Neutrophils: 71 %
Platelets: 171 10*3/uL (ref 150–450)
RBC: 4.97 x10E6/uL (ref 4.14–5.80)
RDW: 13 % (ref 11.6–15.4)
WBC: 7.5 10*3/uL (ref 3.4–10.8)

## 2019-01-01 LAB — CMP14+EGFR
ALT: 10 IU/L (ref 0–44)
AST: 13 IU/L (ref 0–40)
Albumin/Globulin Ratio: 2 (ref 1.2–2.2)
Albumin: 4 g/dL (ref 3.8–4.8)
Alkaline Phosphatase: 55 IU/L (ref 39–117)
BUN/Creatinine Ratio: 15 (ref 10–24)
BUN: 15 mg/dL (ref 8–27)
Bilirubin Total: 0.7 mg/dL (ref 0.0–1.2)
CO2: 21 mmol/L (ref 20–29)
Calcium: 8.9 mg/dL (ref 8.6–10.2)
Chloride: 107 mmol/L — ABNORMAL HIGH (ref 96–106)
Creatinine, Ser: 1.01 mg/dL (ref 0.76–1.27)
GFR calc Af Amer: 87 mL/min/{1.73_m2} (ref >59)
GFR calc non Af Amer: 75 mL/min/{1.73_m2} (ref >59)
Globulin, Total: 2 g/dL (ref 1.5–4.5)
Glucose: 109 mg/dL — ABNORMAL HIGH (ref 65–99)
Potassium: 4.4 mmol/L (ref 3.5–5.2)
Sodium: 141 mmol/L (ref 134–144)
Total Protein: 6 g/dL (ref 6.0–8.5)

## 2019-01-01 LAB — LIPID PANEL
Chol/HDL Ratio: 3.3 ratio (ref 0.0–5.0)
Cholesterol, Total: 142 mg/dL (ref 100–199)
HDL: 43 mg/dL (ref >39)
LDL Chol Calc (NIH): 82 mg/dL (ref 0–99)
Triglycerides: 91 mg/dL (ref 0–149)
VLDL Cholesterol Cal: 17 mg/dL (ref 5–40)

## 2019-01-01 LAB — PSA: Prostate Specific Ag, Serum: 1 ng/mL (ref 0.0–4.0)

## 2019-01-04 NOTE — Progress Notes (Signed)
Cardiology Office Note   Date:  01/06/2019   ID:  Tony Patrick, DOB February 22, 1950, MRN [id number]  PCP:  Terald Sleeper, PA-C  Cardiologist:  Dr. Marlou Porch    Chief Complaint  Patient presents with  . Coronary Artery Disease      History of Present Illness: Tony Patrick is a 68 y.o. male who presents for CAD.  He has a hx of Coronary artery disease status post anterior myocardial infarction in 1995 with Subsequent cardiac catheterization in 2001, see below with ejection fraction of 25% at that time, currently 30-35%, did not wish to proceed with ICD.   Multiple discussions about ICDin the past, not interested. He states that his first heart attack was in 1995 and he has lived a long life since then.  Back in 2012 nuclear stress test was recommended but he did not wish to have this done.  01/06/18 - came to ER with cancelled STEMI, BP of 80, syncope x 3, thought to be Entresto related, cut back. EF 40-45%.  After reducing Entresto, felt better.   He is still working with the Therapist, nutritional periodically daily. His orthopedic issues predominate.  Today he has no chest pain or SOB.  BP stable,  No swelling. His LDL is 82 and goal <70 discussed with pt zetia and he is agreeable.  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  He plans to have vaccine.  No further syncope.     Past Medical History:  Diagnosis Date  . Arthritis   . Atherosclerotic plaque   . Basal cell carcinoma   . CAD (coronary artery disease)    Anterior MI, [facility], 1995, no PCI, total LAD  / catheterization 2001, 30% LAD beyond the origin of a large diagonal, circumflex normal, RCA normal, anterolateral and apical  akinesis, ejection fraction 25-35% range  . Dyslipidemia   . Ejection fraction < 50%    EF 25-35%, catheterization 2001 / EF 20-25%, global hypokinesis, echo in June, 2012  . GERD (gastroesophageal  reflux disease)   . History of bronchitis   . Hypoxia    Pneumonia, hospitalization, June, 2012  . kidney stones   . MI (myocardial infarction) ([place]) 1995  . Pneumonia 2012  . S/P colectomy    Benign tumor    Past Surgical History:  Procedure Laterality Date  . CARDIAC CATHETERIZATION     Patient states he has not had cardiac cath  . CHEST TUBE INSERTION    . COLECTOMY  1981   benign mass  . COLONOSCOPY WITH PROPOFOL N/A 07/04/2014   Procedure: COLONOSCOPY WITH PROPOFOL;  Surgeon: Gatha Mayer, MD;  Location: [facility];  Service: Endoscopy;  Laterality: N/A;  . JOINT REPLACEMENT    . KNEE ARTHROSCOPY Left 07/19/2015   Procedure: LEFT ARTHROSCOPY KNEE WITH MEDIAL MENISCECTOMY;  Surgeon: Latanya Maudlin, MD;  Location: [facility];  Service: Orthopedics;  Laterality: Left;  LMA  . LEG SURGERY Left    cyst  . PLEURAL SCARIFICATION  1978  . SHOULDER OPEN ROTATOR CUFF REPAIR Left 05/26/2015   Procedure: LEFT OPEN ACROMINECTOMY AND REPAIR WITH GRAFT AND ANCHOR ROTATOR CUFF TEAR ;  Surgeon: Latanya Maudlin, MD;  Location: [facility];  Service: Orthopedics;  Laterality: Left;  Left shoulder block in holding  . TOTAL KNEE ARTHROPLASTY Left 01/29/2017   Procedure: LEFT TOTAL KNEE ARTHROPLASTY;  Surgeon: Latanya Maudlin, MD;  Location: [facility];  Service: Orthopedics;  Laterality: Left;  Adductor Block     Current Outpatient Medications  Medication Sig Dispense Refill  . albuterol (PROVENTIL) (2.5 MG/3ML) 0.083% nebulizer solution Take 3 mLs (2.5 mg total) by nebulization every 6 (six) hours as needed for wheezing or shortness of breath. 150 mL 1  . aspirin 81 MG chewable tablet Chew 81 mg by mouth daily.    . carvedilol (COREG) 25 MG tablet TAKE 1 TABLET TWICE A DAY 180 tablet 2  . cetirizine (ZYRTEC) 10 MG tablet Take 1 tablet (10 mg total) by mouth daily. 90 tablet 3  . diclofenac sodium (VOLTAREN) 1 % GEL Apply 1 application topically 3 (three) times daily as needed (for hands). 1200 g 3  .  ENTRESTO 49-51 MG Take 1 tablet by mouth 2 (two) times a day.    . finasteride (PROSCAR) 5 MG tablet Take 5 mg by mouth daily.     . fluticasone (FLONASE) 50 MCG/ACT nasal spray Place 1 spray into both nostrils as needed for allergies or rhinitis. 48 g 3  . HYDROcodone-homatropine (HYCODAN) 5-1.5 MG/5ML syrup Take 5 mLs by mouth every 6 (six) hours as needed for cough. 120 mL 0  . ibuprofen (ADVIL) 800 MG tablet Take 1 tablet (800 mg total) by mouth every 8 (eight) hours as needed. 270 tablet 3  . omeprazole (PRILOSEC) 20 MG capsule Take 1 capsule (20 mg total) by mouth daily. (Needs to be seen before next refill) 90 capsule 3  . simvastatin (ZOCOR) 40 MG tablet Take 1 tablet (40 mg total) by mouth daily at 6 PM. 90 tablet 3  . spironolactone (ALDACTONE) 25 MG tablet TAKE 1 TABLET DAILY 90 tablet 2  . tamsulosin (FLOMAX) 0.4 MG CAPS capsule Take 1 capsule (0.4 mg total) by mouth daily after supper. 90 capsule 3  . ezetimibe (ZETIA) 10 MG tablet Take 1 tablet (10 mg total) by mouth daily. 90 tablet 3  . mupirocin ointment (BACTROBAN) 2 % Apply 1 application topically as needed.     No current facility-administered medications for this visit.    Allergies:   Ivp dye [iodinated diagnostic agents] and Morphine and related    Social History:  The patient  reports that he quit smoking about 26 years ago. His smoking use included cigarettes. He has a 64.00 pack-year smoking history. He has never used smokeless tobacco. He reports that he does not drink alcohol or use drugs.   Family History:  [name] family history includes Cancer in his brother and maternal grandmother; Diabetes in his mother; Hypertension in his mother; Melanoma in his brother; Prostatitis in his brother.    ROS:  General:no colds or fevers,  weight decreased 9 lbs since Feb.   Skin:no rashes or ulcers HEENT:no blurred vision, no congestion CV:see HPI PUL:see HPI GI:no diarrhea constipation or melena, no  indigestion GU:no hematuria, no dysuria MS:no joint pain, no claudication Neuro:no syncope, no lightheadedness Endo:no diabetes, no thyroid disease  Wt Readings from Last 3 Encounters:  01/05/19 256 lb 12.8 oz (116.5 kg)  07/31/18 246 lb (111.6 kg)  06/23/18 246 lb (111.6 kg)     PHYSICAL EXAM: VS:  BP 120/82   Pulse 66   Ht 5\' 9"  (1.753 m)   Wt 256 lb 12.8 oz (116.5 kg)   SpO2 95%   BMI 37.92 kg/m  , BMI Body mass index is 37.92 kg/m. General:Pleasant affect, NAD Skin:Warm and dry, brisk capillary refill HEENT:normocephalic, sclera clear, mucus membranes moist Neck:supple, no  JVD, no bruits  Heart:S1S2 RRR without murmur, gallup, rub or click Lungs:clear without rales, rhonchi, or wheezes [id number], non tender, + BS, do not palpate liver spleen or masses Ext:no lower ext edema, 2+ pedal pulses, 2+ radial pulses Neuro:alert and oriented X 3, MAE, follows commands, + facial symmetry    EKG:  EKG is ordered today. The ekg ordered today demonstrates SR borderline 1st degree AV block IVCD, to atypical RBBB, no acute changes   Recent Labs: 12/31/2018: ALT 10; BUN 15; Creatinine, Ser 1.01; Hemoglobin 15.3; Platelets 171; Potassium 4.4; Sodium 141    Lipid Panel    Component Value Date/Time   CHOL 142 12/31/2018 1100   TRIG 91 12/31/2018 1100   HDL 43 12/31/2018 1100   CHOLHDL 3.3 12/31/2018 1100   LDLCALC 82 12/31/2018 1100       Other studies Reviewed: Additional studies/ records that were reviewed today include: Berneice Gandy Conclusions12/2019 - Left ventricle: The cavity size was severely dilated. There was moderate focal basal hypertrophy of the posterior wall. Systolic function was mildly to moderately reduced. The estimated ejection fraction was in the range of 40% to 45%. Akinesis of the mid-apicalanterior, anterolateral, and apical myocardium. Doppler parameters are consistent with abnormal left ventricular relaxation (grade 1 diastolic  dysfunction). Doppler parameters are consistent with indeterminate ventricular filling pressure. - Aortic valve: Transvalvular velocity was within the normal range. There was no stenosis. There was no regurgitation. - Aorta: Ascending aortic diameter: 40 mm (S). - Ascending aorta: The ascending aorta was mildly dilated. - Mitral valve: Transvalvular velocity was within the normal range. There was no evidence for stenosis. There was trivial regurgitation. - Left atrium: The atrium was severely dilated. - Right ventricle: The cavity size was normal. Wall thickness was normal. Systolic function was normal. - Atrial septum: No defect or patent foramen ovale was identified by color flow Doppler. - Tricuspid valve: There was trivial regurgitation. - Pulmonary arteries: Systolic pressure was within the normal range. PA peak pressure: 19 mm Hg (S). - Pericardium, extracardiac: A trivial pericardial effusion was identified.   Cardiac Cath 05/30/10 ANGIOGRAPHIC DATA: The left main coronary artery is free of significant disease.  The left anterior descending artery courses to the apex. Beyond the origin of the large diagonal branch there is narrowing that measures about 30% in luminal diameter. This does not appear to be high-grade. The LAD courses to the apex and wraps the apical tip. There was a large diagonal branch with minimal ostial irregularity but no high-grade lesions. There are smaller distal diagonal branches.  The circumflex coronary provides a small first marginal branch that is insignificant. The circumflex consists predominately of a very large marginal branch that courses out over the anterolateral wall and bifurcates distally and is free of critical disease. The AV circumflex is small and provides an atrial circumflex branch.  The right coronary artery is a large dominant vessel. The right coronary artery provides a posterior descending branch  which bifurcates and five posterolateral branches. The distal right coronary demonstrates no high-grade lesions.  LEFT VENTRICULOGRAPHY: Ventriculography in the RAO projection reveals anterolateral and apical akinesis. The inferobasal and superior basal segments appear to move. Ejection fraction was calculated at 25% but visually was more in the range of 35%. No significant mitral regurgitation was noted.  CONCLUSIONS: 1. Moderately severe reduction in global left ventricular function. 2. No evidence of re-stenosis in the left anterior descending artery. 3. Continued patency of the circumflex and right coronary arteries.   ASSESSMENT  AND PLAN:  1.  CAD with hx MI 1995, no angina  2.  Chronic systolic HF WF now [id number] stable, has declined ICD 3.  Dilated CM on entresto, unable to add spironolactone due to Cr. On coreg 4.  HTN stable 5.  HLD with goal < 70 will add zetia to meds and recheck lipids in 6-8 weeks.  follow up with Dr. Marlou Porch in 6 months.    Current medicines are reviewed with the patient today.  The patient Has no concerns regarding medicines.  The following changes have been made:  See above Labs/ tests ordered today include:see above  Disposition:   FU:  see above  Signed, Cecilie Kicks, NP  01/06/2019 12:56 PM    [facility] [place], [place], [place], [place] Phone: [phone]; Fax: [phone]

## 2019-01-05 ENCOUNTER — Other Ambulatory Visit: Payer: Self-pay

## 2019-01-05 ENCOUNTER — Ambulatory Visit (INDEPENDENT_AMBULATORY_CARE_PROVIDER_SITE_OTHER): Payer: Medicare Other | Admitting: Cardiology

## 2019-01-05 VITALS — BP 120/82 | HR 66 | Ht 69.0 in | Wt 256.8 lb

## 2019-01-05 DIAGNOSIS — I420 Dilated cardiomyopathy: Secondary | ICD-10-CM | POA: Diagnosis not present

## 2019-01-05 DIAGNOSIS — I5022 Chronic systolic (congestive) heart failure: Secondary | ICD-10-CM

## 2019-01-05 DIAGNOSIS — I10 Essential (primary) hypertension: Secondary | ICD-10-CM

## 2019-01-05 DIAGNOSIS — I2510 Atherosclerotic heart disease of native coronary artery without angina pectoris: Secondary | ICD-10-CM | POA: Diagnosis not present

## 2019-01-05 DIAGNOSIS — E785 Hyperlipidemia, unspecified: Secondary | ICD-10-CM | POA: Diagnosis not present

## 2019-01-05 DIAGNOSIS — I255 Ischemic cardiomyopathy: Secondary | ICD-10-CM

## 2019-01-05 MED ORDER — EZETIMIBE 10 MG PO TABS: 10.0000 mg | ORAL_TABLET | Freq: Every day | ORAL | 3 refills | Status: DC

## 2019-01-05 NOTE — Patient Instructions (Addendum)
Medication Instructions:  Your physician has recommended you make the following change in your medication:  1.  START Zetia 10 mg daily   *If you need a refill on your cardiac medications before your next appointment, please call your pharmacy*  Lab Work: 2/15/2021L:  COME TO THE OFFICE FASTING FOR LIPID / LFT  If you have labs (blood work) drawn today and your tests are completely normal, you will receive your results only by: Marland Kitchen MyChart Message (if you have MyChart) OR . A paper copy in the mail If you have any lab test that is abnormal or we need to change your treatment, we will call you to review the results.  Testing/Procedures: None ordered  Follow-Up: At [facility], you and your health needs are our priority.  As part of our continuing mission to provide you with exceptional heart care, we have created designated Provider Care Teams.  These Care Teams include your primary Cardiologist (physician) and Advanced Practice Providers (APPs -  Physician Assistants and Nurse Practitioners) who all work together to provide you with the care you need, when you need it.  Your next appointment:   6 month(s)  The format for your next appointment:   In Person  Provider:   You may see Candee Furbish, MD or one of the following Advanced Practice Providers on your designated Care Team:    Truitt Merle, NP  Cecilie Kicks, NP  Kathyrn Drown, NP   Other Instructions

## 2019-01-06 ENCOUNTER — Encounter: Payer: Self-pay | Admitting: Cardiology

## 2019-01-15 HISTORY — PX: CATARACT EXTRACTION, BILATERAL: SHX1313

## 2019-01-30 ENCOUNTER — Other Ambulatory Visit: Payer: Self-pay | Admitting: Nurse Practitioner

## 2019-02-05 ENCOUNTER — Other Ambulatory Visit: Payer: Self-pay | Admitting: Cardiology

## 2019-02-05 MED ORDER — EZETIMIBE 10 MG PO TABS: 10.0000 mg | ORAL_TABLET | Freq: Every day | ORAL | 3 refills | Status: DC

## 2019-02-27 IMAGING — DX DG CHEST 1V PORT
3 series · 3 of 3 positions shown · non-contrast
Comparison: 01/20/2017

CLINICAL DATA: Central chest pain, possible STEMI

EXAM:
PORTABLE CHEST 1 VIEW

[chest ap (1 of 3)]
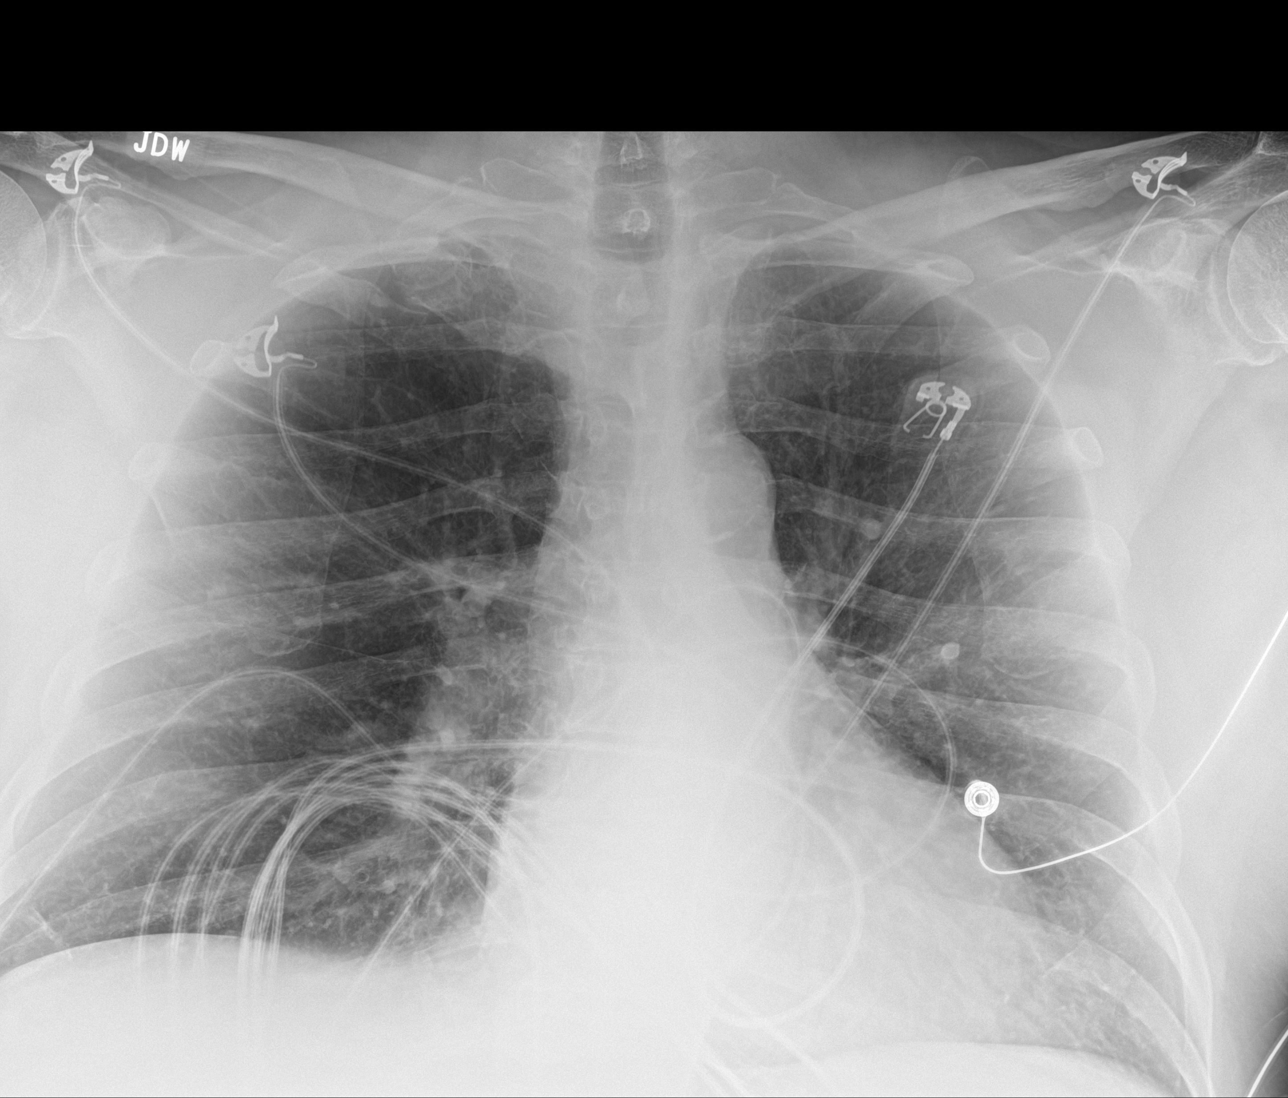

[chest ap (2 of 3)]
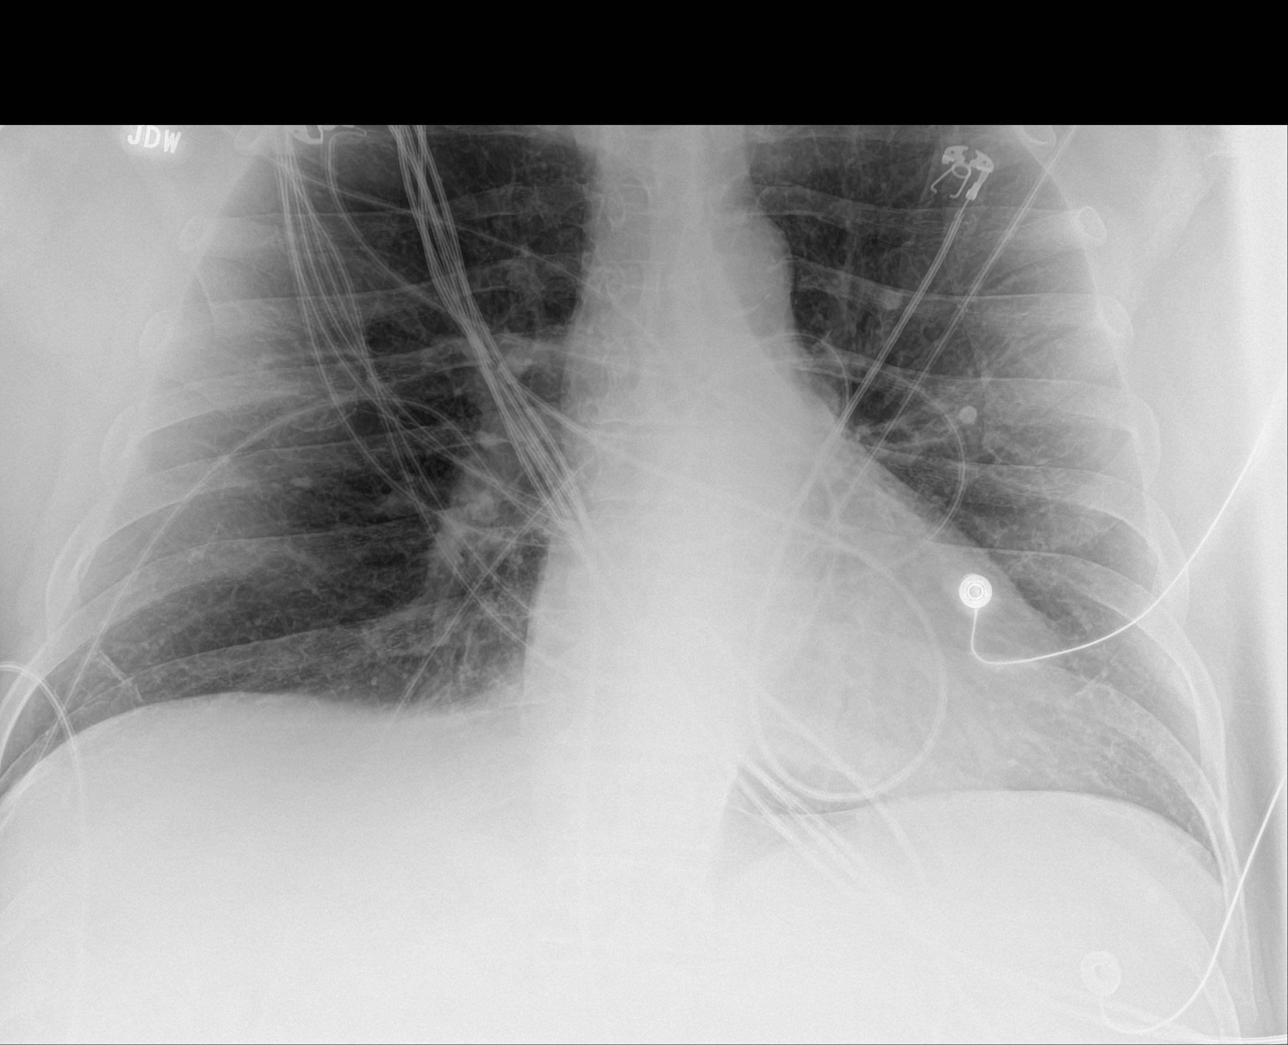

[chest ap (3 of 3)]
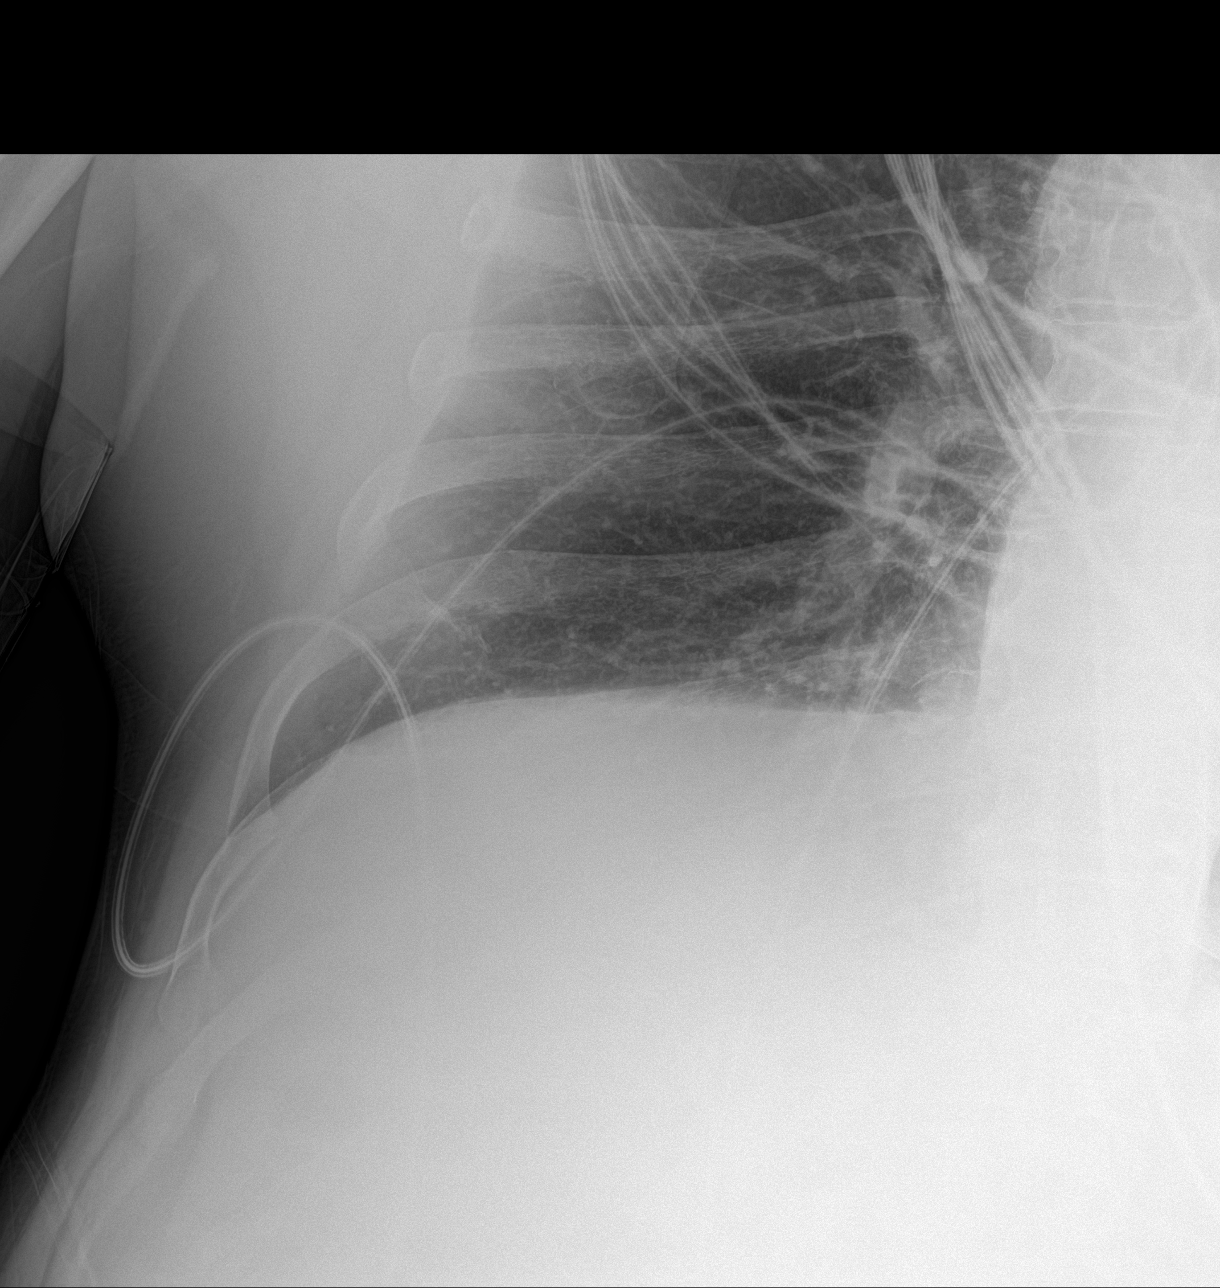

[3 of 3 positions shown; findings below may reference images not displayed]

FINDINGS: Cardiac shadow is stable. Aortic calcifications are again seen.
Lungs are well aerated bilaterally. No focal infiltrate or effusion
is seen. No acute bony abnormality is noted. No pneumothorax is
noted.
IMPRESSION: No acute abnormality seen.

Aortic Atherosclerosis ([id number]).

## 2019-03-01 ENCOUNTER — Other Ambulatory Visit: Payer: Medicare Other

## 2019-03-01 ENCOUNTER — Other Ambulatory Visit: Payer: Self-pay

## 2019-03-01 DIAGNOSIS — I420 Dilated cardiomyopathy: Secondary | ICD-10-CM | POA: Diagnosis not present

## 2019-03-01 DIAGNOSIS — I2510 Atherosclerotic heart disease of native coronary artery without angina pectoris: Secondary | ICD-10-CM

## 2019-03-01 DIAGNOSIS — E785 Hyperlipidemia, unspecified: Secondary | ICD-10-CM | POA: Diagnosis not present

## 2019-03-01 DIAGNOSIS — I5022 Chronic systolic (congestive) heart failure: Secondary | ICD-10-CM | POA: Diagnosis not present

## 2019-03-01 LAB — HEPATIC FUNCTION PANEL
ALT: 15 IU/L (ref 0–44)
AST: 14 IU/L (ref 0–40)
Albumin: 4.1 g/dL (ref 3.8–4.8)
Alkaline Phosphatase: 52 IU/L (ref 39–117)
Bilirubin Total: 0.7 mg/dL (ref 0.0–1.2)
Bilirubin, Direct: 0.2 mg/dL (ref 0.00–0.40)
Total Protein: 6.1 g/dL (ref 6.0–8.5)

## 2019-03-01 LAB — LIPID PANEL
Chol/HDL Ratio: 2.9 ratio (ref 0.0–5.0)
Cholesterol, Total: 114 mg/dL (ref 100–199)
HDL: 39 mg/dL — ABNORMAL LOW (ref >39)
LDL Chol Calc (NIH): 58 mg/dL (ref 0–99)
Triglycerides: 88 mg/dL (ref 0–149)
VLDL Cholesterol Cal: 17 mg/dL (ref 5–40)

## 2019-03-12 DIAGNOSIS — H2513 Age-related nuclear cataract, bilateral: Secondary | ICD-10-CM | POA: Diagnosis not present

## 2019-03-12 DIAGNOSIS — H0102B Squamous blepharitis left eye, upper and lower eyelids: Secondary | ICD-10-CM | POA: Diagnosis not present

## 2019-03-12 DIAGNOSIS — H0102A Squamous blepharitis right eye, upper and lower eyelids: Secondary | ICD-10-CM | POA: Diagnosis not present

## 2019-04-01 DIAGNOSIS — H2511 Age-related nuclear cataract, right eye: Secondary | ICD-10-CM | POA: Diagnosis not present

## 2019-04-01 DIAGNOSIS — H5703 Miosis: Secondary | ICD-10-CM | POA: Diagnosis not present

## 2019-04-21 ENCOUNTER — Other Ambulatory Visit: Payer: Self-pay

## 2019-04-21 ENCOUNTER — Ambulatory Visit (INDEPENDENT_AMBULATORY_CARE_PROVIDER_SITE_OTHER): Payer: Medicare Other | Admitting: Family

## 2019-04-21 ENCOUNTER — Encounter: Payer: Self-pay | Admitting: Family

## 2019-04-21 VITALS — BP 121/71 | HR 58 | Temp 98.6°F | Wt 253.0 lb

## 2019-04-21 DIAGNOSIS — J309 Allergic rhinitis, unspecified: Secondary | ICD-10-CM | POA: Diagnosis not present

## 2019-04-21 DIAGNOSIS — R351 Nocturia: Secondary | ICD-10-CM | POA: Diagnosis not present

## 2019-04-21 DIAGNOSIS — N401 Enlarged prostate with lower urinary tract symptoms: Secondary | ICD-10-CM

## 2019-04-21 DIAGNOSIS — E785 Hyperlipidemia, unspecified: Secondary | ICD-10-CM | POA: Diagnosis not present

## 2019-04-21 DIAGNOSIS — K219 Gastro-esophageal reflux disease without esophagitis: Secondary | ICD-10-CM

## 2019-04-21 DIAGNOSIS — I252 Old myocardial infarction: Secondary | ICD-10-CM | POA: Diagnosis not present

## 2019-04-21 DIAGNOSIS — I2510 Atherosclerotic heart disease of native coronary artery without angina pectoris: Secondary | ICD-10-CM

## 2019-04-21 DIAGNOSIS — Z1159 Encounter for screening for other viral diseases: Secondary | ICD-10-CM | POA: Diagnosis not present

## 2019-04-21 DIAGNOSIS — R350 Frequency of micturition: Secondary | ICD-10-CM

## 2019-04-21 DIAGNOSIS — I10 Essential (primary) hypertension: Secondary | ICD-10-CM

## 2019-04-21 MED ORDER — ENTRESTO 49-51 MG PO TABS: 1.0000 | ORAL_TABLET | Freq: Two times a day (BID) | ORAL | 3 refills | Status: AC

## 2019-04-21 MED ORDER — CARVEDILOL 25 MG PO TABS: 25.0000 mg | ORAL_TABLET | Freq: Two times a day (BID) | ORAL | 2 refills | Status: AC

## 2019-04-21 MED ORDER — CETIRIZINE HCL 10 MG PO TABS: 10.0000 mg | ORAL_TABLET | Freq: Every day | ORAL | 3 refills | Status: AC

## 2019-04-21 MED ORDER — OMEPRAZOLE 20 MG PO CPDR: 20.0000 mg | DELAYED_RELEASE_CAPSULE | Freq: Every day | ORAL | 3 refills | Status: AC

## 2019-04-21 MED ORDER — SPIRONOLACTONE 25 MG PO TABS: 25.0000 mg | ORAL_TABLET | Freq: Every day | ORAL | 2 refills | Status: AC

## 2019-04-21 MED ORDER — FINASTERIDE 5 MG PO TABS: 5.0000 mg | ORAL_TABLET | Freq: Every day | ORAL | 3 refills | Status: AC

## 2019-04-21 MED ORDER — EZETIMIBE 10 MG PO TABS: 10.0000 mg | ORAL_TABLET | Freq: Every day | ORAL | 3 refills | Status: AC

## 2019-04-21 MED ORDER — IBUPROFEN 800 MG PO TABS: 800.0000 mg | ORAL_TABLET | Freq: Three times a day (TID) | ORAL | 3 refills | Status: AC | PRN

## 2019-04-21 MED ORDER — TAMSULOSIN HCL 0.4 MG PO CAPS: 0.4000 mg | ORAL_CAPSULE | Freq: Every day | ORAL | 3 refills | Status: AC

## 2019-04-21 MED ORDER — SIMVASTATIN 40 MG PO TABS: 40.0000 mg | ORAL_TABLET | Freq: Every day | ORAL | 3 refills | Status: AC

## 2019-04-21 NOTE — Patient Instructions (Signed)
Health Maintenance After Age 71 After age 71, you are at a higher risk for certain long-term diseases and infections as well as injuries from falls. Falls are a major cause of broken bones and head injuries in people who are older than age 71. Getting regular preventive care can help to keep you healthy and well. Preventive care includes getting regular testing and making lifestyle changes as recommended by your health care provider. Talk with your health care provider about:  Which screenings and tests you should have. A screening is a test that checks for a disease when you have no symptoms.  A diet and exercise plan that is right for you. What should I know about screenings and tests to prevent falls? Screening and testing are the best ways to find a health problem early. Early diagnosis and treatment give you the best chance of managing medical conditions that are common after age 71. Certain conditions and lifestyle choices may make you more likely to have a fall. Your health care provider may recommend:  Regular vision checks. Poor vision and conditions such as cataracts can make you more likely to have a fall. If you wear glasses, make sure to get your prescription updated if your vision changes.  Medicine review. Work with your health care provider to regularly review all of the medicines you are taking, including over-the-counter medicines. Ask your health care provider about any side effects that may make you more likely to have a fall. Tell your health care provider if any medicines that you take make you feel dizzy or sleepy.  Osteoporosis screening. Osteoporosis is a condition that causes the bones to get weaker. This can make the bones weak and cause them to break more easily.  Blood pressure screening. Blood pressure changes and medicines to control blood pressure can make you feel dizzy.  Strength and balance checks. Your health care provider may recommend certain tests to check your  strength and balance while standing, walking, or changing positions.  Foot health exam. Foot pain and numbness, as well as not wearing proper footwear, can make you more likely to have a fall.  Depression screening. You may be more likely to have a fall if you have a fear of falling, feel emotionally low, or feel unable to do activities that you used to do.  Alcohol use screening. Using too much alcohol can affect your balance and may make you more likely to have a fall. What actions can I take to lower my risk of falls? General instructions  Talk with your health care provider about your risks for falling. Tell your health care provider if: ? You fall. Be sure to tell your health care provider about all falls, even ones that seem minor. ? You feel dizzy, sleepy, or off-balance.  Take over-the-counter and prescription medicines only as told by your health care provider. These include any supplements.  Eat a healthy diet and maintain a healthy weight. A healthy diet includes low-fat dairy products, low-fat (lean) meats, and fiber from whole grains, beans, and lots of fruits and vegetables. Home safety  Remove any tripping hazards, such as rugs, cords, and clutter.  Install safety equipment such as grab bars in bathrooms and safety rails on stairs.  Keep rooms and walkways well-lit. Activity   Follow a regular exercise program to stay fit. This will help you maintain your balance. Ask your health care provider what types of exercise are appropriate for you.  If you need a cane or   walker, use it as recommended by your health care provider.  Wear supportive shoes that have nonskid soles. Lifestyle  Do not drink alcohol if your health care provider tells you not to drink.  If you drink alcohol, limit how much you have: ? 0-1 drink a day for women. ? 0-2 drinks a day for men.  Be aware of how much alcohol is in your drink. In the [place] one drink equals one typical bottle of beer (12  oz), one-half glass of wine (5 oz), or one shot of hard liquor (1 oz).  Do not use any products that contain nicotine or tobacco, such as cigarettes and e-cigarettes. If you need help quitting, ask your health care provider. Summary  Having a healthy lifestyle and getting preventive care can help to protect your health and wellness after age 71.  Screening and testing are the best way to find a health problem early and help you avoid having a fall. Early diagnosis and treatment give you the best chance for managing medical conditions that are more common for people who are older than age 71.  Falls are a major cause of broken bones and head injuries in people who are older than age 71. Take precautions to prevent a fall at home.  Work with your health care provider to learn what changes you can make to improve your health and wellness and to prevent falls. This information is not intended to replace advice given to you by your health care provider. Make sure you discuss any questions you have with your health care provider. Document Revised: 04/23/2018 Document Reviewed: 11/13/2016 Elsevier Patient Education  2020 [employer]  

## 2019-04-21 NOTE — Progress Notes (Signed)
Subjective:    Patient ID: Tony Patrick, male    DOB: 1950/02/10, 69 y.o.   MRN: [id number]  Chief Complaint  Patient presents with  . Medical Management of Chronic Issues   PT presents to the office today for chronic follow up. He is followed by Cardiologists annually for HX MI in 1995. Pt is followed by Urologists annually for BPH.  Hypertension This is a chronic problem. The current episode started more than 1 year ago. The problem has been resolved since onset. The problem is controlled. Pertinent negatives include no malaise/fatigue, peripheral edema or shortness of breath. Risk factors for coronary artery disease include dyslipidemia, diabetes mellitus, obesity, male gender and sedentary lifestyle. The current treatment provides moderate improvement. Hypertensive end-organ damage includes CAD/MI. There is no history of kidney disease.  Gastroesophageal Reflux He complains of belching and heartburn. This is a chronic problem. The current episode started more than 1 year ago. The problem occurs occasionally. The problem has been waxing and waning. The symptoms are aggravated by certain foods. Risk factors include obesity. He has tried a PPI for the symptoms. The treatment provided moderate relief.  Benign Prostatic Hypertrophy This is a chronic problem. The current episode started more than 1 year ago. The problem has been waxing and waning since onset. Irritative symptoms include nocturia.  Hyperlipidemia This is a chronic problem. The current episode started more than 1 year ago. The problem is controlled. Exacerbating diseases include obesity. Pertinent negatives include no shortness of breath. Current antihyperlipidemic treatment includes statins. The current treatment provides moderate improvement of lipids. Risk factors for coronary artery disease include dyslipidemia, male sex, hypertension and a sedentary lifestyle.      Review of Systems  Constitutional: Negative for  malaise/fatigue.  Respiratory: Negative for shortness of breath.   Gastrointestinal: Positive for heartburn.  Genitourinary: Positive for nocturia.  All other systems reviewed and are negative.      Objective:   Physical Exam Vitals reviewed.  Constitutional:      General: He is not in acute distress.    Appearance: He is well-developed. He is obese.  HENT:     Head: Normocephalic.     Right Ear: Tympanic membrane normal.     Left Ear: Tympanic membrane normal.  Eyes:     General:        Right eye: No discharge.        Left eye: No discharge.     Pupils: Pupils are equal, round, and reactive to light.  Neck:     Thyroid: No thyromegaly.  Cardiovascular:     Rate and Rhythm: Normal rate and regular rhythm.     Heart sounds: Normal heart sounds. No murmur.  Pulmonary:     Effort: Pulmonary effort is normal. No respiratory distress.     Breath sounds: Normal breath sounds. No wheezing.  Abdominal:     General: Bowel sounds are normal. There is no distension.     Palpations: Abdomen is soft.     Tenderness: There is no abdominal tenderness.  Musculoskeletal:        General: No tenderness. Normal range of motion.     Cervical back: Normal range of motion and neck supple.  Skin:    General: Skin is warm and dry.     Findings: No erythema or rash.  Neurological:     Mental Status: He is alert and oriented to person, place, and time.     Cranial Nerves: No cranial nerve deficit.  Deep Tendon Reflexes: Reflexes are normal and symmetric.  Psychiatric:        Behavior: Behavior normal.        Thought Content: Thought content normal.        Judgment: Judgment normal.     BP 121/71   Pulse (!) 58   Temp 98.6 F (37 C) (Temporal)   Wt 253 lb (114.8 kg)   SpO2 97%   BMI 37.36 kg/m      Assessment & Plan:  Tony Patrick comes in today with chief complaint of Medical Management of Chronic Issues   Diagnosis and orders addressed:  1. Allergic rhinitis,  unspecified seasonality, unspecified trigger - cetirizine (ZYRTEC) 10 MG tablet; Take 1 tablet (10 mg total) by mouth daily.  Dispense: 90 tablet; Refill: 3 - CMP14+EGFR - CBC with Differential/Platelet  2. Benign prostatic hyperplasia with urinary frequency - finasteride (PROSCAR) 5 MG tablet; Take 1 tablet (5 mg total) by mouth daily.  Dispense: 90 tablet; Refill: 3 - tamsulosin (FLOMAX) 0.4 MG CAPS capsule; Take 1 capsule (0.4 mg total) by mouth daily after supper.  Dispense: 90 capsule; Refill: 3 - CMP14+EGFR - CBC with Differential/Platelet  3. Coronary artery disease involving native coronary artery of native heart without angina pectoris - carvedilol (COREG) 25 MG tablet; Take 1 tablet (25 mg total) by mouth 2 (two) times daily.  Dispense: 180 tablet; Refill: 2 - CMP14+EGFR - CBC with Differential/Platelet  4. Coronary artery disease involving native coronary artery, angina presence unspecified, unspecified whether native or transplanted heart - simvastatin (ZOCOR) 40 MG tablet; Take 1 tablet (40 mg total) by mouth daily at 6 PM.  Dispense: 90 tablet; Refill: 3 - CMP14+EGFR - CBC with Differential/Platelet - Lipid panel  5. Essential hypertension - CMP14+EGFR - CBC with Differential/Platelet  6. Gastroesophageal reflux disease without esophagitis - CMP14+EGFR - CBC with Differential/Platelet  7. Benign prostatic hyperplasia with nocturia - CMP14+EGFR - CBC with Differential/Platelet  8. Dyslipidemia - CMP14+EGFR - CBC with Differential/Platelet - Lipid panel  9. History of MI (myocardial infarction) - CMP14+EGFR - CBC with Differential/Platelet  10. Need for hepatitis C screening test  - CMP14+EGFR - CBC with Differential/Platelet - Hepatitis C antibody   Labs pending Health Maintenance reviewed Diet and exercise encouraged  Follow up plan: 6 months    Christy Hawks, FNP  

## 2019-04-22 LAB — LIPID PANEL
Chol/HDL Ratio: 3.2 ratio (ref 0.0–5.0)
Cholesterol, Total: 117 mg/dL (ref 100–199)
HDL: 37 mg/dL — ABNORMAL LOW (ref >39)
LDL Chol Calc (NIH): 61 mg/dL (ref 0–99)
Triglycerides: 104 mg/dL (ref 0–149)
VLDL Cholesterol Cal: 19 mg/dL (ref 5–40)

## 2019-04-22 LAB — CBC WITH DIFFERENTIAL/PLATELET
Basophils Absolute: 0 10*3/uL (ref 0.0–0.2)
Basos: 1 %
EOS (ABSOLUTE): 0.2 10*3/uL (ref 0.0–0.4)
Eos: 3 %
Hematocrit: 47 % (ref 37.5–51.0)
Hemoglobin: 15.8 g/dL (ref 13.0–17.7)
Immature Grans (Abs): 0 10*3/uL (ref 0.0–0.1)
Immature Granulocytes: 0 %
Lymphocytes Absolute: 1.6 10*3/uL (ref 0.7–3.1)
Lymphs: 21 %
MCH: 31 pg (ref 26.6–33.0)
MCHC: 33.6 g/dL (ref 31.5–35.7)
MCV: 92 fL (ref 79–97)
Monocytes Absolute: 0.7 10*3/uL (ref 0.1–0.9)
Monocytes: 8 %
Neutrophils Absolute: 5.2 10*3/uL (ref 1.4–7.0)
Neutrophils: 67 %
Platelets: 192 10*3/uL (ref 150–450)
RBC: 5.09 x10E6/uL (ref 4.14–5.80)
RDW: 13.2 % (ref 11.6–15.4)
WBC: 7.8 10*3/uL (ref 3.4–10.8)

## 2019-04-22 LAB — CMP14+EGFR
ALT: 17 IU/L (ref 0–44)
AST: 20 IU/L (ref 0–40)
Albumin/Globulin Ratio: 1.9 (ref 1.2–2.2)
Albumin: 4.2 g/dL (ref 3.8–4.8)
Alkaline Phosphatase: 54 IU/L (ref 39–117)
BUN/Creatinine Ratio: 27 — ABNORMAL HIGH (ref 10–24)
BUN: 26 mg/dL (ref 8–27)
Bilirubin Total: 0.7 mg/dL (ref 0.0–1.2)
CO2: 21 mmol/L (ref 20–29)
Calcium: 9 mg/dL (ref 8.6–10.2)
Chloride: 110 mmol/L — ABNORMAL HIGH (ref 96–106)
Creatinine, Ser: 0.97 mg/dL (ref 0.76–1.27)
GFR calc Af Amer: 91 mL/min/{1.73_m2} (ref >59)
GFR calc non Af Amer: 79 mL/min/{1.73_m2} (ref >59)
Globulin, Total: 2.2 g/dL (ref 1.5–4.5)
Glucose: 92 mg/dL (ref 65–99)
Potassium: 4.4 mmol/L (ref 3.5–5.2)
Sodium: 144 mmol/L (ref 134–144)
Total Protein: 6.4 g/dL (ref 6.0–8.5)

## 2019-04-22 LAB — HEPATITIS C ANTIBODY: Hep C Virus Ab: <0.1 s/co ratio (ref 0.0–0.9)

## 2019-05-27 DIAGNOSIS — H2512 Age-related nuclear cataract, left eye: Secondary | ICD-10-CM | POA: Diagnosis not present

## 2019-08-02 ENCOUNTER — Ambulatory Visit (INDEPENDENT_AMBULATORY_CARE_PROVIDER_SITE_OTHER): Payer: Medicare Other | Admitting: *Deleted

## 2019-08-02 DIAGNOSIS — Z0000 Encounter for general adult medical examination without abnormal findings: Secondary | ICD-10-CM | POA: Diagnosis not present

## 2019-08-02 NOTE — Progress Notes (Addendum)
MEDICARE ANNUAL WELLNESS VISIT  08/02/2019  Telephone Visit Disclaimer This Medicare AWV was conducted by telephone due to national recommendations for restrictions regarding the COVID-19 Pandemic (e.g. social distancing).  I verified, using two identifiers, that I am speaking with Tony Patrick or their authorized healthcare agent. I discussed the limitations, risks, security, and privacy concerns of performing an evaluation and management service by telephone and the potential availability of an in-person appointment in the future. The patient expressed understanding and agreed to proceed.   Subjective:  Tony Patrick is a 69 y.o. male patient of Hawks, Theador Hawthorne, FNP who had a Medicare Annual Wellness Visit today via telephone. 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 he has 3 children. he reports that he is socially active and does interact with friends/family regularly. he is moderately physically active and enjoys woodworking, working in the yard and being a Museum/gallery conservator.  Patient Care Team: Sharion Balloon, FNP as PCP - General (Family Medicine) Jerline Pain, MD as PCP - Cardiology (Cardiology)  Advanced Directives 08/02/2019 07/31/2018 01/06/2018 01/29/2017 01/29/2017 01/20/2017 10/22/2015  Does Patient Have a Medical Advance Directive? No No No Yes Yes Yes No  Type of Advance Directive - - - Living will Living will Living will -  Does patient want to make changes to medical advance directive? - - - No - Patient declined No - Patient declined No - Patient declined -  Copy of [place] in [place]  Would patient like information on creating a medical advance directive? No - Patient declined Yes (MAU/Ambulatory/Procedural Areas - Information given) No - Patient declined - - - [facility] Utilization Over the Past 12 Months: # of  hospitalizations or ER visits: 0 # of surgeries: 2-cataract extractions  Review of Systems    Patient reports that his overall health is better compared to last year.  History obtained from chart review and the patient  Patient Reported Readings (BP, Pulse, CBG, Weight, etc) none  Pain Assessment Pain : No/denies pain     Current Medications & Allergies (verified) Allergies as of 08/02/2019       Reactions   Ivp Dye [iodinated Diagnostic Agents] Swelling, Other (See Comments)   Arrest   Morphine And Related Nausea And Vomiting        Medication List        Accurate as of August 02, 2019 10:00 AM. If you have any questions, ask your nurse or doctor.          albuterol (2.5 MG/3ML) 0.083% nebulizer solution Commonly known as: PROVENTIL Take 3 mLs (2.5 mg total) by nebulization every 6 (six) hours as needed for wheezing or shortness of breath.   aspirin 81 MG chewable tablet Chew 81 mg by mouth daily.   carvedilol 25 MG tablet Commonly known as: COREG Take 1 tablet (25 mg total) by mouth 2 (two) times daily.   cetirizine 10 MG tablet Commonly known as: ZYRTEC Take 1 tablet (10 mg total) by mouth daily.   diclofenac sodium 1 % Gel Commonly known as: VOLTAREN Apply 1 application topically 3 (three) times daily as needed (for hands).   Entresto 49-51 MG Generic drug: sacubitril-valsartan Take 1 tablet by mouth 2 (two) times daily.   ezetimibe 10 MG tablet Commonly known as: ZETIA  Take 1 tablet (10 mg total) by mouth daily.   finasteride 5 MG tablet Commonly known as: PROSCAR Take 1 tablet (5 mg total) by mouth daily.   fluticasone 50 MCG/ACT nasal spray Commonly known as: FLONASE Place 1 spray into both nostrils as needed for allergies or rhinitis.   ibuprofen 800 MG tablet Commonly known as: ADVIL Take 1 tablet (800 mg total) by mouth every 8 (eight) hours as needed.   mupirocin ointment 2 % Commonly known as: BACTROBAN Apply 1 application  topically as needed.   omeprazole 20 MG capsule Commonly known as: PRILOSEC Take 1 capsule (20 mg total) by mouth daily.   simvastatin 40 MG tablet Commonly known as: ZOCOR Take 1 tablet (40 mg total) by mouth daily at 6 PM.   spironolactone 25 MG tablet Commonly known as: ALDACTONE Take 1 tablet (25 mg total) by mouth daily.   tamsulosin 0.4 MG Caps capsule Commonly known as: FLOMAX Take 1 capsule (0.4 mg total) by mouth daily after supper.        History (reviewed): Past Medical History:  Diagnosis Date   Arthritis    Atherosclerotic plaque    Basal cell carcinoma    CAD (coronary artery disease)    Anterior MI, [facility], 1995, no PCI, total LAD  / catheterization 2001, 30% LAD beyond the origin of a large diagonal, circumflex normal, RCA normal, anterolateral and apical  akinesis, ejection fraction 25-35% range   Dyslipidemia    Ejection fraction < 50%    EF 25-35%, catheterization 2001 / EF 20-25%, global hypokinesis, echo in June, 2012   GERD (gastroesophageal reflux disease)    History of bronchitis    Hypoxia    Pneumonia, hospitalization, June, 2012   kidney stones    MI (myocardial infarction) ([place]) 1995   Pneumonia 2012   S/P colectomy    Benign tumor   Past Surgical History:  Procedure Laterality Date   CARDIAC CATHETERIZATION     Patient states he has not had cardiac cath   CATARACT EXTRACTION, BILATERAL Bilateral 2021   CHEST TUBE INSERTION     COLECTOMY  1981   benign mass   COLONOSCOPY WITH PROPOFOL N/A 07/04/2014   Procedure: COLONOSCOPY WITH PROPOFOL;  Surgeon: Gatha Mayer, MD;  Location: [facility];  Service: Endoscopy;  Laterality: N/A;   JOINT REPLACEMENT     KNEE ARTHROSCOPY Left 07/19/2015   Procedure: LEFT ARTHROSCOPY KNEE WITH MEDIAL MENISCECTOMY;  Surgeon: Latanya Maudlin, MD;  Location: [facility];  Service: Orthopedics;  Laterality: Left;  LMA   LEG SURGERY Left    cyst   PLEURAL SCARIFICATION  1978   SHOULDER OPEN ROTATOR CUFF REPAIR  Left 05/26/2015   Procedure: LEFT OPEN ACROMINECTOMY AND REPAIR WITH GRAFT AND ANCHOR ROTATOR CUFF TEAR ;  Surgeon: Latanya Maudlin, MD;  Location: [facility];  Service: Orthopedics;  Laterality: Left;  Left shoulder block in holding   TOTAL KNEE ARTHROPLASTY Left 01/29/2017   Procedure: LEFT TOTAL KNEE ARTHROPLASTY;  Surgeon: Latanya Maudlin, MD;  Location: [facility];  Service: Orthopedics;  Laterality: Left;  Adductor Block   Family History  Problem Relation Age of Onset   Diabetes Mother    Hypertension Mother    Cancer Maternal Grandmother        type unknown   Melanoma Brother    Cancer Brother        type unknown   Prostatitis Brother    Social History   Socioeconomic History   Marital status: Married  Spouse name: Tammi   Number of children: 3   Years of education: Not on file   Highest education level: Some college, no degree  Occupational History   Occupation: retired  Tobacco Use   Smoking status: Former Smoker    Packs/day: 2.00    Years: 32.00    Pack years: 64.00    Types: Cigarettes    Quit date: 04/14/1992    Years since quitting: 27.3   Smokeless tobacco: Never Used  Vaping Use   Vaping Use: Never used  Substance and Sexual Activity   Alcohol use: No   Drug use: No   Sexual activity: Yes  Other Topics Concern   Not on file  Social History Narrative   Lives with wife   Retired [place] [employer] (34 yrs)   Museum/gallery conservator   Social Determinants of Radio broadcast assistant Strain: Low Risk    Difficulty of Paying Living Expenses: Not hard at all  Food Insecurity: No Food Insecurity   Worried About Charity fundraiser in the Last Year: Never true   Arboriculturist in the Last Year: Never true  Transportation Needs: No Transportation Needs   Lack of Transportation (Medical): No   Lack of Transportation (Non-Medical): No  Physical Activity: Sufficiently Active   Days of Exercise per Week: 5 days   Minutes of Exercise per Session: 30 min  Stress: No Stress  Concern Present   Feeling of Stress : Not at all  Social Connections: Socially Integrated   Frequency of Communication with Friends and Family: More than three times a week   Frequency of Social Gatherings with Friends and Family: More than three times a week   Attends Religious Services: More than 4 times per year   Active Member of [employer] or Organizations: Yes   Attends Archivist Meetings: More than 4 times per year   Marital Status: Married    Activities of Daily Living In your present state of health, do you have any difficulty performing the following activities: 08/02/2019  Hearing? Y  Comment hard of hearing-went to [place] for hearing test a few years ago  Vision? N  Comment had bilateral cataracts removed  Difficulty concentrating or making decisions? N  Walking or climbing stairs? N  Dressing or bathing? N  Doing errands, shopping? N  Preparing Food and eating ? N  Using the Toilet? N  In the past six months, have you accidently leaked urine? Y  Comment sees the urologist yearly  Do you have problems with loss of bowel control? N  Managing your Medications? N  Managing your Finances? N  Housekeeping or managing your Housekeeping? N  Some recent data might be hidden    Patient Education/ Literacy How often do you need to have someone help you when you read instructions, pamphlets, or other written materials from your doctor or pharmacy?: 1 - Never What is the last grade level you completed in school?: Some College-no degree  Exercise Current Exercise Habits: The patient has a physically strenuous job, but has no regular exercise apart from work.;Home exercise routine, Type of exercise: walking;Other - see comments (training for volunteer fireman), Time (Minutes): 30, Frequency (Times/Week): 5, Weekly Exercise (Minutes/Week): 150, Intensity: Mild, Exercise limited by: cardiac condition(s)  Diet Patient reports consuming 3 meals a day and 2 snack(s) a day Patient  reports that his primary diet is: Regular Patient reports that she does have regular access to food.   Depression Screen  PHQ 2/9 Scores 08/02/2019 07/31/2018 03/13/2018 02/06/2018 11/06/2017 03/13/2017 05/14/2016  PHQ - 2 Score 0 0 0 0 0 0 0     Fall Risk Fall Risk  08/02/2019 07/31/2018 03/13/2018 02/06/2018 11/06/2017  Falls in the past year? 0 0 0 0 No  Number falls in past yr: - 0 - - -  Injury with Fall? - 0 - - -  Comment - - - - -  Risk Factor Category  - - - - -  Follow up - - - - -     Objective:  Tony Patrick seemed alert and oriented and he participated appropriately during our telephone visit.  Blood Pressure Weight BMI  BP Readings from Last 3 Encounters:  04/21/19 121/71  01/05/19 120/82  06/23/18 140/80   Wt Readings from Last 3 Encounters:  04/21/19 253 lb (114.8 kg)  01/05/19 256 lb 12.8 oz (116.5 kg)  07/31/18 246 lb (111.6 kg)   BMI Readings from Last 1 Encounters:  04/21/19 37.36 kg/m    *Unable to obtain current vital signs, weight, and BMI due to telephone visit type  Hearing/Vision  Nadim did not seem to have difficulty with hearing/understanding during the telephone conversation Reports that he has had a formal eye exam by an eye care professional within the past year Reports that he has not had a formal hearing evaluation within the past year *Unable to fully assess hearing and vision during telephone visit type  Cognitive Function: 6CIT Screen 08/02/2019 07/31/2018  What Year? 0 points 0 points  What month? 0 points 0 points  What time? 0 points 0 points  Count back from 20 0 points 0 points  Months in reverse 0 points 0 points  Repeat phrase 0 points 0 points  Total Score 0 0   (Normal:0-7, Significant for Dysfunction: >8)  Normal Cognitive Function Screening: Yes   Immunization & Health Maintenance Record Immunization History  Administered Date(s) Administered   Influenza, High Dose Seasonal PF 11/21/2016   Influenza,inj,Quad PF,6+ Mos  12/14/2015   Influenza-Unspecified 11/14/2016, 11/14/2017   Pneumococcal Conjugate-13 11/21/2016   Pneumococcal Polysaccharide-23 12/14/2015   Pneumococcal-Unspecified 11/14/2016   Td 03/15/2015    Health Maintenance  Topic Date Due   COVID-19 Vaccine (1) Never done   INFLUENZA VACCINE  08/15/2019   COLONOSCOPY  07/03/2024   TETANUS/TDAP  03/14/2025   Hepatitis C Screening  Completed   PNA vac Low Risk Adult  Completed       Assessment  This is a routine wellness examination for [employer].  Health Maintenance: Due or Overdue Health Maintenance Due  Topic Date Due   COVID-19 Vaccine (1) Never done    Tony Patrick does not need a referral for Community Assistance: Care Management:   no Social Work:    no Prescription Assistance:  no Nutrition/Diabetes Education:  no   Plan:  Personalized Goals Goals Addressed             This Visit's Progress    DIET - INCREASE WATER INTAKE       Try to drink 6-8 glasses of water daily       Personalized Health Maintenance & Screening Recommendations  He declines all recommended immunizations including COVID and Shingrix  Lung Cancer Screening Recommended: no (Low Dose CT Chest recommended if Age 26-80 years, 30 pack-year currently smoking OR have quit w/in past 15 years) Hepatitis C Screening recommended: no HIV Screening recommended: no  Advanced Directives: Written information was not prepared per patient's request.  Referrals & Orders No orders of the defined types were placed in this encounter.   Follow-up Plan Follow-up with Sharion Balloon, FNP as planned [facility] to see if you can qualify for hearing aids    I have personally reviewed and noted the following in the patient's chart:   Medical and social history Use of alcohol, tobacco or illicit drugs  Current medications and supplements Functional ability and status Nutritional status Physical  activity Advanced directives List of other physicians Hospitalizations, surgeries, and ER visits in previous 12 months Vitals Screenings to include cognitive, depression, and falls Referrals and appointments  In addition, I have reviewed and discussed with Tony Patrick certain preventive protocols, quality metrics, and best practice recommendations. A written personalized care plan for preventive services as well as general preventive health recommendations is available and can be mailed to the patient at his request.      Milas Hock, LPN  2/00/3794   I have reviewed and agree with the above AWV documentation.   Evelina Dun, FNP

## 2019-08-02 NOTE — Patient Instructions (Signed)

## 2019-08-13 ENCOUNTER — Ambulatory Visit (INDEPENDENT_AMBULATORY_CARE_PROVIDER_SITE_OTHER): Payer: Medicare Other | Admitting: Family

## 2019-08-13 ENCOUNTER — Telehealth: Payer: Self-pay | Admitting: Family

## 2019-08-13 DIAGNOSIS — J988 Other specified respiratory disorders: Secondary | ICD-10-CM | POA: Diagnosis not present

## 2019-08-13 DIAGNOSIS — J3489 Other specified disorders of nose and nasal sinuses: Secondary | ICD-10-CM | POA: Diagnosis not present

## 2019-08-13 DIAGNOSIS — R05 Cough: Secondary | ICD-10-CM | POA: Diagnosis not present

## 2019-08-13 DIAGNOSIS — R52 Pain, unspecified: Secondary | ICD-10-CM | POA: Diagnosis not present

## 2019-08-13 NOTE — Progress Notes (Signed)
Attempted to call patient multiple times with no answer. VM left and no return call. Will close visit.

## 2019-10-12 ENCOUNTER — Other Ambulatory Visit: Payer: Self-pay

## 2019-10-12 ENCOUNTER — Encounter: Payer: Self-pay | Admitting: Cardiology

## 2019-10-12 ENCOUNTER — Ambulatory Visit (INDEPENDENT_AMBULATORY_CARE_PROVIDER_SITE_OTHER): Payer: Medicare Other | Admitting: Cardiology

## 2019-10-12 VITALS — BP 100/70 | HR 65 | Ht 69.0 in | Wt 247.0 lb

## 2019-10-12 DIAGNOSIS — I2510 Atherosclerotic heart disease of native coronary artery without angina pectoris: Secondary | ICD-10-CM | POA: Diagnosis not present

## 2019-10-12 DIAGNOSIS — I10 Essential (primary) hypertension: Secondary | ICD-10-CM

## 2019-10-12 DIAGNOSIS — I5022 Chronic systolic (congestive) heart failure: Secondary | ICD-10-CM | POA: Diagnosis not present

## 2019-10-12 NOTE — Patient Instructions (Signed)
Medication Instructions:  The current medical regimen is effective;  continue present plan and medications.  *If you need a refill on your cardiac medications before your next appointment, please call your pharmacy*  Follow-Up: At [facility], you and your health needs are our priority.  As part of our continuing mission to provide you with exceptional heart care, we have created designated Provider Care Teams.  These Care Teams include your primary Cardiologist (physician) and Advanced Practice Providers (APPs -  Physician Assistants and Nurse Practitioners) who all work together to provide you with the care you need, when you need it.  We recommend signing up for the patient portal called "MyChart".  Sign up information is provided on this After Visit Summary.  MyChart is used to connect with patients for Virtual Visits (Telemedicine).  Patients are able to view lab/test results, encounter notes, upcoming appointments, etc.  Non-urgent messages can be sent to your provider as well.   To learn more about what you can do with MyChart, go to [email or web address].    Your next appointment:   12 month(s)  The format for your next appointment:   In Person  Provider:   Mark Skains, MD   Thank you for choosing [facility]!!      

## 2019-10-12 NOTE — Progress Notes (Signed)
Cardiology Office Note:    Date:  10/12/2019   ID:  Tony Patrick, DOB 1950/05/05, MRN [id number]  PCP:  Sharion Balloon, FNP  [facility] Cardiologist:  Candee Furbish, MD  [facility] Electrophysiologist:  None   Referring MD: Sharion Balloon, FNP     History of Present Illness:    Tony Patrick is a 69 y.o. male here for the follow-up of coronary artery disease status post anterior MI 48.  Also has ischemic cardiomyopathy EF 30 to 35%, did not want to go forward with ICD at that time.  Most recent EF 40 to 45%  Overall feeling well.  He is still volunteering with EMS, fire department.  Staying active.  He had his cataracts removed and now has 20/20 vision.  Denies any fevers chills nausea vomiting syncope bleeding.  Past Medical History:  Diagnosis Date  . Arthritis   . Atherosclerotic plaque   . Basal cell carcinoma   . CAD (coronary artery disease)    Anterior MI, [facility], 1995, no PCI, total LAD  / catheterization 2001, 30% LAD beyond the origin of a large diagonal, circumflex normal, RCA normal, anterolateral and apical  akinesis, ejection fraction 25-35% range  . Dyslipidemia   . Ejection fraction < 50%    EF 25-35%, catheterization 2001 / EF 20-25%, global hypokinesis, echo in June, 2012  . GERD (gastroesophageal reflux disease)   . History of bronchitis   . Hypoxia    Pneumonia, hospitalization, June, 2012  . kidney stones   . MI (myocardial infarction) ([place]) 1995  . Pneumonia 2012  . S/P colectomy    Benign tumor    Past Surgical History:  Procedure Laterality Date  . CARDIAC CATHETERIZATION     Patient states he has not had cardiac cath  . CATARACT EXTRACTION, BILATERAL Bilateral 2021  . CHEST TUBE INSERTION    . COLECTOMY  1981   benign mass  . COLONOSCOPY WITH PROPOFOL N/A 07/04/2014   Procedure: COLONOSCOPY WITH PROPOFOL;  Surgeon: Gatha Mayer, MD;  Location: [facility];  Service: Endoscopy;  Laterality: N/A;  . JOINT REPLACEMENT    .  KNEE ARTHROSCOPY Left 07/19/2015   Procedure: LEFT ARTHROSCOPY KNEE WITH MEDIAL MENISCECTOMY;  Surgeon: Latanya Maudlin, MD;  Location: [facility];  Service: Orthopedics;  Laterality: Left;  LMA  . LEG SURGERY Left    cyst  . PLEURAL SCARIFICATION  1978  . SHOULDER OPEN ROTATOR CUFF REPAIR Left 05/26/2015   Procedure: LEFT OPEN ACROMINECTOMY AND REPAIR WITH GRAFT AND ANCHOR ROTATOR CUFF TEAR ;  Surgeon: Latanya Maudlin, MD;  Location: [facility];  Service: Orthopedics;  Laterality: Left;  Left shoulder block in holding  . TOTAL KNEE ARTHROPLASTY Left 01/29/2017   Procedure: LEFT TOTAL KNEE ARTHROPLASTY;  Surgeon: Latanya Maudlin, MD;  Location: [facility];  Service: Orthopedics;  Laterality: Left;  Adductor Block    Current Medications: Current Meds  Medication Sig  . albuterol (PROVENTIL) (2.5 MG/3ML) 0.083% nebulizer solution Take 3 mLs (2.5 mg total) by nebulization every 6 (six) hours as needed for wheezing or shortness of breath.  Marland Kitchen aspirin 81 MG chewable tablet Chew 81 mg by mouth daily.  . carvedilol (COREG) 25 MG tablet Take 1 tablet (25 mg total) by mouth 2 (two) times daily.  . cetirizine (ZYRTEC) 10 MG tablet Take 1 tablet (10 mg total) by mouth daily.  . diclofenac sodium (VOLTAREN) 1 % GEL Apply 1 application topically 3 (three) times daily as needed (for hands).  Marland Kitchen  ezetimibe (ZETIA) 10 MG tablet Take 1 tablet (10 mg total) by mouth daily.  . finasteride (PROSCAR) 5 MG tablet Take 1 tablet (5 mg total) by mouth daily.  . fluticasone (FLONASE) 50 MCG/ACT nasal spray Place 1 spray into both nostrils as needed for allergies or rhinitis.  Marland Kitchen ibuprofen (ADVIL) 800 MG tablet Take 1 tablet (800 mg total) by mouth every 8 (eight) hours as needed.  . mupirocin ointment (BACTROBAN) 2 % Apply 1 application topically as needed.  Marland Kitchen omeprazole (PRILOSEC) 20 MG capsule Take 1 capsule (20 mg total) by mouth daily.  . sacubitril-valsartan (ENTRESTO) 49-51 MG Take 1 tablet by mouth 2 (two) times daily.  .  simvastatin (ZOCOR) 40 MG tablet Take 1 tablet (40 mg total) by mouth daily at 6 PM.  . spironolactone (ALDACTONE) 25 MG tablet Take 1 tablet (25 mg total) by mouth daily.  . tamsulosin (FLOMAX) 0.4 MG CAPS capsule Take 1 capsule (0.4 mg total) by mouth daily after supper.     Allergies:   Ivp dye [iodinated diagnostic agents] and Morphine and related   Social History   Socioeconomic History  . Marital status: Married    Spouse name: Tammi  . Number of children: 3  . Years of education: Not on file  . Highest education level: Some college, no degree  Occupational History  . Occupation: retired  Tobacco Use  . Smoking status: Former Smoker    Packs/day: 2.00    Years: 32.00    Pack years: 64.00    Types: Cigarettes    Quit date: 04/14/1992    Years since quitting: 27.5  . Smokeless tobacco: Never Used  Vaping Use  . Vaping Use: Never used  Substance and Sexual Activity  . Alcohol use: No  . Drug use: No  . Sexual activity: Yes  Other Topics Concern  . 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 Difficulty of Paying Living Expenses: Not hard at all  Food Insecurity: No Food Insecurity  . Worried About Charity fundraiser in the Last Year: Never true  . Ran Out of Food in the Last Year: Never true  Transportation Needs: No Transportation Needs  . Lack of Transportation (Medical): No  . Lack of Transportation (Non-Medical): No  Physical Activity: Sufficiently Active  . Days of Exercise per Week: 5 days  . Minutes of Exercise per Session: 30 min  Stress: No Stress Concern Present  . Feeling of Stress : Not at all  Social Connections: Socially Integrated  . Frequency of Communication with Friends and Family: More than three times a week  . Frequency of Social Gatherings with Friends and Family: More than three times a week  . Attends  Religious Services: More than 4 times per year  . Active Member of Clubs or Organizations: Yes  . Attends Archivist Meetings: More than 4 times per year  . Marital Status: Married     Family History: [name] family history includes Cancer in his brother and maternal grandmother; Diabetes in his mother; Hypertension in his mother; Melanoma in his brother; Prostatitis in his brother.  ROS:   Please see the history of present illness.     All other systems reviewed and are negative.  EKGs/Labs/Other Studies Reviewed:    The following studies were reviewed  today:  EchoStudy Conclusions12/2019 - Left ventricle: The cavity size was severely dilated. There was moderate focal basal hypertrophy of the posterior wall. Systolic function was mildly to moderately reduced. The estimated ejection fraction was in the range of 40% to 45%. Akinesis of the mid-apicalanterior, anterolateral, and apical myocardium. Doppler parameters are consistent with abnormal left ventricular relaxation (grade 1 diastolic dysfunction). Doppler parameters are consistent with indeterminate ventricular filling pressure. - Aortic valve: Transvalvular velocity was within the normal range. There was no stenosis. There was no regurgitation. - Aorta: Ascending aortic diameter: 40 mm (S). - Ascending aorta: The ascending aorta was mildly dilated. - Mitral valve: Transvalvular velocity was within the normal range. There was no evidence for stenosis. There was trivial regurgitation. - Left atrium: The atrium was severely dilated. - Right ventricle: The cavity size was normal. Wall thickness was normal. Systolic function was normal. - Atrial septum: No defect or patent foramen ovale was identified by color flow Doppler. - Tricuspid valve: There was trivial regurgitation. - Pulmonary arteries: Systolic pressure was within the normal range. PA peak pressure: 19 mm Hg (S). -  Pericardium, extracardiac: A trivial pericardial effusion was identified.   Cardiac Cath 05/30/10 ANGIOGRAPHIC DATA: The left main coronary artery is free of significant disease.  The left anterior descending artery courses to the apex. Beyond the origin of the large diagonal branch there is narrowing that measures about 30% in luminal diameter. This does not appear to be high-grade. The LAD courses to the apex and wraps the apical tip. There was a large diagonal branch with minimal ostial irregularity but no high-grade lesions. There are smaller distal diagonal branches.  The circumflex coronary provides a small first marginal branch that is insignificant. The circumflex consists predominately of a very large marginal branch that courses out over the anterolateral wall and bifurcates distally and is free of critical disease. The AV circumflex is small and provides an atrial circumflex branch.  The right coronary artery is a large dominant vessel. The right coronary artery provides a posterior descending branch which bifurcates and five posterolateral branches. The distal right coronary demonstrates no high-grade lesions.  LEFT VENTRICULOGRAPHY: Ventriculography in the RAO projection reveals anterolateral and apical akinesis. The inferobasal and superior basal segments appear to move. Ejection fraction was calculated at 25% but visually was more in the range of 35%. No significant mitral regurgitation was noted.  CONCLUSIONS: 1. Moderately severe reduction in global left ventricular function. 2. No evidence of re-stenosis in the left anterior descending artery. 3. Continued patency of the circumflex and right coronary arteries.  EKG:  EKG is  ordered today.  The ekg ordered today demonstrates sinus rhythm left bundle branch block  Recent Labs: 04/21/2019: ALT 17; BUN 26; Creatinine, Ser 0.97; Hemoglobin 15.8; Platelets 192; Potassium 4.4; Sodium 144  Recent  Lipid Panel    Component Value Date/Time   CHOL 117 04/21/2019 1248   TRIG 104 04/21/2019 1248   HDL 37 (L) 04/21/2019 1248   CHOLHDL 3.2 04/21/2019 1248   LDLCALC 61 04/21/2019 1248    Physical Exam:    VS:  BP 100/70   Pulse 65   Ht 5\' 9"  (1.753 m)   Wt 247 lb (112 kg)   SpO2 92%   BMI 36.48 kg/m     Wt Readings from Last 3 Encounters:  10/12/19 247 lb (112 kg)  04/21/19 253 lb (114.8 kg)  01/05/19 256 lb 12.8 oz (116.5 kg)     GEN:  Well nourished, well  developed in no acute distress HEENT: Normal NECK: No JVD; No carotid bruits LYMPHATICS: No lymphadenopathy CARDIAC: RRR, no murmurs, rubs, gallops RESPIRATORY:  Clear to auscultation without rales, wheezing or rhonchi  ABDOMEN: Soft, non-tender, non-distended MUSCULOSKELETAL:  No edema; No deformity  SKIN: Warm and dry NEUROLOGIC:  Alert and oriented x 3 PSYCHIATRIC:  Normal affect   ASSESSMENT:    1. Chronic systolic heart failure ([place])   2. Essential hypertension    PLAN:    In order of problems listed above:  Coronary artery disease prior MI -1995 no anginal symptoms.  Catheterization reviewed as above.  Ischemic dilated cardiomyopathy -On Entresto.   Blood pressure 100/70 today.  No dizziness, no orthostasis.  Doing very well.  Also on spironolactone as well as carvedilol.  Last EF in the 45 range.  Hyperlipidemia -LDL goal less than 70.  At prior visit we added Zetia 10 mg to his simvastatin 40.  Last LDL 61.  Excellent.  ALT 17.  Creatinine 0.9 potassium 4.4.   Left bundle branch block/nonspecific interventricular conduction delay -No changes when personally reviewing prior EKG. no syncope.  Discussed the possibility of pacemaker in the future.   Medication Adjustments/Labs and Tests Ordered: Current medicines are reviewed at length with the patient today.  Concerns regarding medicines are outlined above.  Orders Placed This Encounter  Procedures  . EKG 12-Lead   No orders of the defined  types were placed in this encounter.   Patient Instructions  Medication Instructions:  The current medical regimen is effective;  continue present plan and medications.  *If you need a refill on your cardiac medications before your next appointment, please call your pharmacy*  Follow-Up: At [facility], you and your health needs are our priority.  As part of our continuing mission to provide you with exceptional heart care, we have created designated Provider Care Teams.  These Care Teams include your primary Cardiologist (physician) and Advanced Practice Providers (APPs -  Physician Assistants and Nurse Practitioners) who all work together to provide you with the care you need, when you need it.  We recommend signing up for the patient portal called "MyChart".  Sign up information is provided on this After Visit Summary.  MyChart is used to connect with patients for Virtual Visits (Telemedicine).  Patients are able to view lab/test results, encounter notes, upcoming appointments, etc.  Non-urgent messages can be sent to your provider as well.   To learn more about what you can do with MyChart, go to [email or web address].    Your next appointment:   12 month(s)  The format for your next appointment:   In Person  Provider:   Candee Furbish, MD  Thank you for choosing [facility]!!        Signed, Candee Furbish, MD  10/12/2019 11:49 AM    [place]

## 2019-12-20 ENCOUNTER — Telehealth: Payer: Self-pay

## 2019-12-21 DIAGNOSIS — R059 Cough, unspecified: Secondary | ICD-10-CM | POA: Diagnosis not present

## 2019-12-21 DIAGNOSIS — R509 Fever, unspecified: Secondary | ICD-10-CM | POA: Diagnosis not present

## 2019-12-21 DIAGNOSIS — J329 Chronic sinusitis, unspecified: Secondary | ICD-10-CM | POA: Diagnosis not present

## 2019-12-24 DIAGNOSIS — E669 Obesity, unspecified: Secondary | ICD-10-CM | POA: Diagnosis present

## 2019-12-24 DIAGNOSIS — J9601 Acute respiratory failure with hypoxia: Secondary | ICD-10-CM | POA: Diagnosis not present

## 2019-12-24 DIAGNOSIS — Z7982 Long term (current) use of aspirin: Secondary | ICD-10-CM | POA: Diagnosis not present

## 2019-12-24 DIAGNOSIS — J189 Pneumonia, unspecified organism: Secondary | ICD-10-CM | POA: Diagnosis not present

## 2019-12-24 DIAGNOSIS — Z87891 Personal history of nicotine dependence: Secondary | ICD-10-CM | POA: Diagnosis not present

## 2019-12-24 DIAGNOSIS — R791 Abnormal coagulation profile: Secondary | ICD-10-CM | POA: Diagnosis not present

## 2019-12-24 DIAGNOSIS — J9690 Respiratory failure, unspecified, unspecified whether with hypoxia or hypercapnia: Secondary | ICD-10-CM | POA: Diagnosis not present

## 2019-12-24 DIAGNOSIS — Z96659 Presence of unspecified artificial knee joint: Secondary | ICD-10-CM | POA: Diagnosis present

## 2019-12-24 DIAGNOSIS — I420 Dilated cardiomyopathy: Secondary | ICD-10-CM | POA: Diagnosis not present

## 2019-12-24 DIAGNOSIS — I110 Hypertensive heart disease with heart failure: Secondary | ICD-10-CM | POA: Diagnosis not present

## 2019-12-24 DIAGNOSIS — U071 COVID-19: Secondary | ICD-10-CM | POA: Diagnosis not present

## 2019-12-24 DIAGNOSIS — I5022 Chronic systolic (congestive) heart failure: Secondary | ICD-10-CM | POA: Diagnosis present

## 2019-12-24 DIAGNOSIS — J181 Lobar pneumonia, unspecified organism: Secondary | ICD-10-CM | POA: Diagnosis not present

## 2019-12-24 DIAGNOSIS — J1282 Pneumonia due to coronavirus disease 2019: Secondary | ICD-10-CM | POA: Diagnosis not present

## 2019-12-24 DIAGNOSIS — I5020 Unspecified systolic (congestive) heart failure: Secondary | ICD-10-CM | POA: Diagnosis not present

## 2019-12-24 DIAGNOSIS — Z6833 Body mass index (BMI) 33.0-33.9, adult: Secondary | ICD-10-CM | POA: Diagnosis not present

## 2019-12-24 DIAGNOSIS — K219 Gastro-esophageal reflux disease without esophagitis: Secondary | ICD-10-CM | POA: Diagnosis present

## 2019-12-24 DIAGNOSIS — E876 Hypokalemia: Secondary | ICD-10-CM | POA: Diagnosis present

## 2019-12-24 DIAGNOSIS — R7989 Other specified abnormal findings of blood chemistry: Secondary | ICD-10-CM | POA: Diagnosis not present

## 2019-12-24 DIAGNOSIS — I252 Old myocardial infarction: Secondary | ICD-10-CM | POA: Diagnosis not present

## 2019-12-24 DIAGNOSIS — R0902 Hypoxemia: Secondary | ICD-10-CM | POA: Diagnosis not present

## 2019-12-24 DIAGNOSIS — Z7901 Long term (current) use of anticoagulants: Secondary | ICD-10-CM | POA: Diagnosis not present

## 2019-12-24 DIAGNOSIS — E785 Hyperlipidemia, unspecified: Secondary | ICD-10-CM | POA: Diagnosis present

## 2019-12-24 DIAGNOSIS — Z7951 Long term (current) use of inhaled steroids: Secondary | ICD-10-CM | POA: Diagnosis not present

## 2019-12-24 DIAGNOSIS — R0602 Shortness of breath: Secondary | ICD-10-CM | POA: Diagnosis not present

## 2019-12-27 NOTE — Telephone Encounter (Signed)
Patient in [facility] with [place] he is not doing good at all. Wife wants him transferred to [facility] and told us to tell them it was okay. Patients wife aware we did not have hospital privileges. Wants Christy to know and if there is anything she can do?

## 2019-12-28 NOTE — Telephone Encounter (Signed)
I am very sorry to hear this. Unfortunately, I do not have hospital privileges. This is something she would have to ask [facility] about transferring him.

## 2020-01-01 ENCOUNTER — Telehealth: Payer: Self-pay | Admitting: Medical

## 2020-01-01 NOTE — Telephone Encounter (Signed)
[name] daughter called to report patient is admitted to [facility] with covid pna now on the ventilator.  12/7 went to urgent care and got tested for covid 12/10 patient went in for an infusion at [facility]. O2 was low so he was admitted. He was functional. 12/17 Put on the ventilator [name] daughter is very worried since it's a very small hospital and hoping to get him transferred to [facility]. She spoke to the IM service who said there were no beds. I said unfortunately I'm not sure there is any way we can help with the process. She can try again to talk to the primary team to get him transferred. I will let Dr. Marlou Porch know.   Tony Patrick Tony Mody, PA-C

## 2020-01-01 NOTE — Telephone Encounter (Signed)
Thank you for the update Candee Furbish, MD

## 2020-01-02 DIAGNOSIS — I5022 Chronic systolic (congestive) heart failure: Secondary | ICD-10-CM | POA: Diagnosis present

## 2020-01-02 DIAGNOSIS — R509 Fever, unspecified: Secondary | ICD-10-CM | POA: Diagnosis not present

## 2020-01-02 DIAGNOSIS — Z79899 Other long term (current) drug therapy: Secondary | ICD-10-CM | POA: Diagnosis not present

## 2020-01-02 DIAGNOSIS — R578 Other shock: Secondary | ICD-10-CM | POA: Diagnosis not present

## 2020-01-02 DIAGNOSIS — Z7189 Other specified counseling: Secondary | ICD-10-CM | POA: Diagnosis not present

## 2020-01-02 DIAGNOSIS — U071 COVID-19: Secondary | ICD-10-CM | POA: Diagnosis present

## 2020-01-02 DIAGNOSIS — N400 Enlarged prostate without lower urinary tract symptoms: Secondary | ICD-10-CM | POA: Diagnosis present

## 2020-01-02 DIAGNOSIS — J9602 Acute respiratory failure with hypercapnia: Secondary | ICD-10-CM | POA: Diagnosis not present

## 2020-01-02 DIAGNOSIS — I110 Hypertensive heart disease with heart failure: Secondary | ICD-10-CM | POA: Diagnosis present

## 2020-01-02 DIAGNOSIS — R579 Shock, unspecified: Secondary | ICD-10-CM | POA: Diagnosis not present

## 2020-01-02 DIAGNOSIS — G9341 Metabolic encephalopathy: Secondary | ICD-10-CM | POA: Diagnosis not present

## 2020-01-02 DIAGNOSIS — Z452 Encounter for adjustment and management of vascular access device: Secondary | ICD-10-CM | POA: Diagnosis not present

## 2020-01-02 DIAGNOSIS — K560 Paralytic ileus: Secondary | ICD-10-CM | POA: Diagnosis not present

## 2020-01-02 DIAGNOSIS — J80 Acute respiratory distress syndrome: Secondary | ICD-10-CM | POA: Diagnosis present

## 2020-01-02 DIAGNOSIS — A419 Sepsis, unspecified organism: Secondary | ICD-10-CM | POA: Diagnosis not present

## 2020-01-02 DIAGNOSIS — J440 Chronic obstructive pulmonary disease with acute lower respiratory infection: Secondary | ICD-10-CM | POA: Diagnosis present

## 2020-01-02 DIAGNOSIS — K219 Gastro-esophageal reflux disease without esophagitis: Secondary | ICD-10-CM | POA: Diagnosis present

## 2020-01-02 DIAGNOSIS — Z9911 Dependence on respirator [ventilator] status: Secondary | ICD-10-CM | POA: Diagnosis not present

## 2020-01-02 DIAGNOSIS — R0489 Hemorrhage from other sites in respiratory passages: Secondary | ICD-10-CM | POA: Diagnosis not present

## 2020-01-02 DIAGNOSIS — I420 Dilated cardiomyopathy: Secondary | ICD-10-CM | POA: Diagnosis present

## 2020-01-02 DIAGNOSIS — D688 Other specified coagulation defects: Secondary | ICD-10-CM | POA: Diagnosis present

## 2020-01-02 DIAGNOSIS — J9601 Acute respiratory failure with hypoxia: Secondary | ICD-10-CM | POA: Diagnosis not present

## 2020-01-02 DIAGNOSIS — Z4682 Encounter for fitting and adjustment of non-vascular catheter: Secondary | ICD-10-CM | POA: Diagnosis not present

## 2020-01-02 DIAGNOSIS — Z515 Encounter for palliative care: Secondary | ICD-10-CM | POA: Diagnosis not present

## 2020-01-02 DIAGNOSIS — I2510 Atherosclerotic heart disease of native coronary artery without angina pectoris: Secondary | ICD-10-CM | POA: Diagnosis present

## 2020-01-02 DIAGNOSIS — J441 Chronic obstructive pulmonary disease with (acute) exacerbation: Secondary | ICD-10-CM | POA: Diagnosis present

## 2020-01-02 DIAGNOSIS — K567 Ileus, unspecified: Secondary | ICD-10-CM | POA: Diagnosis not present

## 2020-01-02 DIAGNOSIS — I824Z3 Acute embolism and thrombosis of unspecified deep veins of distal lower extremity, bilateral: Secondary | ICD-10-CM | POA: Diagnosis not present

## 2020-01-02 DIAGNOSIS — J1282 Pneumonia due to coronavirus disease 2019: Secondary | ICD-10-CM | POA: Diagnosis not present

## 2020-01-02 DIAGNOSIS — J90 Pleural effusion, not elsewhere classified: Secondary | ICD-10-CM | POA: Diagnosis not present

## 2020-01-02 DIAGNOSIS — J9691 Respiratory failure, unspecified with hypoxia: Secondary | ICD-10-CM | POA: Diagnosis not present

## 2020-01-02 DIAGNOSIS — N179 Acute kidney failure, unspecified: Secondary | ICD-10-CM | POA: Diagnosis not present

## 2020-01-02 DIAGNOSIS — R0902 Hypoxemia: Secondary | ICD-10-CM | POA: Diagnosis not present

## 2020-01-02 DIAGNOSIS — M1990 Unspecified osteoarthritis, unspecified site: Secondary | ICD-10-CM | POA: Diagnosis present

## 2020-01-02 DIAGNOSIS — I255 Ischemic cardiomyopathy: Secondary | ICD-10-CM | POA: Diagnosis present

## 2020-01-02 DIAGNOSIS — J189 Pneumonia, unspecified organism: Secondary | ICD-10-CM | POA: Diagnosis not present

## 2020-01-02 DIAGNOSIS — R0602 Shortness of breath: Secondary | ICD-10-CM | POA: Diagnosis not present

## 2020-01-02 DIAGNOSIS — A4189 Other specified sepsis: Secondary | ICD-10-CM | POA: Diagnosis present

## 2020-01-02 DIAGNOSIS — I959 Hypotension, unspecified: Secondary | ICD-10-CM | POA: Diagnosis not present

## 2020-01-02 DIAGNOSIS — I82493 Acute embolism and thrombosis of other specified deep vein of lower extremity, bilateral: Secondary | ICD-10-CM | POA: Diagnosis not present

## 2020-01-02 DIAGNOSIS — I82463 Acute embolism and thrombosis of calf muscular vein, bilateral: Secondary | ICD-10-CM | POA: Diagnosis present

## 2020-01-02 DIAGNOSIS — J159 Unspecified bacterial pneumonia: Secondary | ICD-10-CM | POA: Diagnosis present

## 2020-01-02 DIAGNOSIS — R6521 Severe sepsis with septic shock: Secondary | ICD-10-CM | POA: Diagnosis not present

## 2020-01-02 DIAGNOSIS — R918 Other nonspecific abnormal finding of lung field: Secondary | ICD-10-CM | POA: Diagnosis not present

## 2020-01-02 DIAGNOSIS — R9431 Abnormal electrocardiogram [ECG] [EKG]: Secondary | ICD-10-CM | POA: Diagnosis not present

## 2020-01-02 DIAGNOSIS — J95851 Ventilator associated pneumonia: Secondary | ICD-10-CM | POA: Diagnosis not present

## 2020-01-02 DIAGNOSIS — I509 Heart failure, unspecified: Secondary | ICD-10-CM | POA: Diagnosis not present

## 2020-01-02 DIAGNOSIS — Z20822 Contact with and (suspected) exposure to covid-19: Secondary | ICD-10-CM | POA: Diagnosis not present

## 2020-01-02 DIAGNOSIS — E785 Hyperlipidemia, unspecified: Secondary | ICD-10-CM | POA: Diagnosis present

## 2020-01-02 DIAGNOSIS — I252 Old myocardial infarction: Secondary | ICD-10-CM | POA: Diagnosis not present

## 2020-01-05 NOTE — Telephone Encounter (Signed)
Multiple attempts made to contact patient this encounter will now be closed.  

## 2020-01-15 DEATH — deceased
